# Patient Record
Sex: Male | Born: 1955 | Race: White | Hispanic: No | Smoking: Current some day smoker
Health system: Southern US, Community
[De-identification: ages and names within clinical notes are randomized; demographics above are authoritative.]

## PROBLEM LIST (undated history)

## (undated) DIAGNOSIS — Z87442 Personal history of urinary calculi: Secondary | ICD-10-CM

## (undated) DIAGNOSIS — I6529 Occlusion and stenosis of unspecified carotid artery: Secondary | ICD-10-CM

## (undated) DIAGNOSIS — E785 Hyperlipidemia, unspecified: Secondary | ICD-10-CM

## (undated) DIAGNOSIS — M79606 Pain in leg, unspecified: Secondary | ICD-10-CM

## (undated) DIAGNOSIS — I480 Paroxysmal atrial fibrillation: Secondary | ICD-10-CM

## (undated) DIAGNOSIS — I2510 Atherosclerotic heart disease of native coronary artery without angina pectoris: Secondary | ICD-10-CM

## (undated) DIAGNOSIS — I10 Essential (primary) hypertension: Secondary | ICD-10-CM

## (undated) DIAGNOSIS — G458 Other transient cerebral ischemic attacks and related syndromes: Secondary | ICD-10-CM

## (undated) HISTORY — PX: CYSTECTOMY: SUR359

## (undated) HISTORY — DX: Paroxysmal atrial fibrillation: I48.0

## (undated) HISTORY — DX: Essential (primary) hypertension: I10

## (undated) HISTORY — DX: Pain in leg, unspecified: M79.606

## (undated) HISTORY — DX: Occlusion and stenosis of unspecified carotid artery: I65.29

## (undated) HISTORY — PX: OTHER SURGICAL HISTORY: SHX169

## (undated) HISTORY — DX: Other transient cerebral ischemic attacks and related syndromes: G45.8

## (undated) HISTORY — PX: SHOULDER ARTHROSCOPY WITH OPEN ROTATOR CUFF REPAIR: SHX6092

## (undated) HISTORY — PX: REPAIR PATELLAR TENDON: SUR1199

---

## 1976-02-29 HISTORY — PX: JOINT REPLACEMENT: SHX530

## 2001-11-12 HISTORY — PX: COLONOSCOPY W/ POLYPECTOMY: SHX1380

## 2002-05-04 ENCOUNTER — Encounter: Payer: Self-pay | Admitting: Internal Medicine

## 2005-03-23 ENCOUNTER — Ambulatory Visit (HOSPITAL_COMMUNITY): Admission: RE | Admit: 2005-03-23 | Discharge: 2005-03-23 | Payer: Self-pay | Admitting: Orthopedic Surgery

## 2005-03-23 ENCOUNTER — Ambulatory Visit (HOSPITAL_BASED_OUTPATIENT_CLINIC_OR_DEPARTMENT_OTHER): Admission: RE | Admit: 2005-03-23 | Discharge: 2005-03-23 | Payer: Self-pay | Admitting: Orthopedic Surgery

## 2005-10-22 ENCOUNTER — Ambulatory Visit: Payer: Self-pay | Admitting: Internal Medicine

## 2005-11-20 ENCOUNTER — Ambulatory Visit: Payer: Self-pay | Admitting: Internal Medicine

## 2006-02-06 ENCOUNTER — Encounter: Payer: Self-pay | Admitting: Internal Medicine

## 2006-06-04 ENCOUNTER — Ambulatory Visit: Payer: Self-pay | Admitting: Internal Medicine

## 2006-06-06 ENCOUNTER — Ambulatory Visit: Payer: Self-pay | Admitting: Internal Medicine

## 2006-06-07 ENCOUNTER — Ambulatory Visit: Payer: Self-pay | Admitting: Cardiology

## 2006-11-22 ENCOUNTER — Ambulatory Visit: Payer: Self-pay | Admitting: Internal Medicine

## 2007-10-10 ENCOUNTER — Ambulatory Visit: Payer: Self-pay | Admitting: Gastroenterology

## 2007-10-13 HISTORY — PX: COLONOSCOPY: SHX174

## 2007-10-24 ENCOUNTER — Encounter: Payer: Self-pay | Admitting: Internal Medicine

## 2007-10-24 ENCOUNTER — Ambulatory Visit: Payer: Self-pay | Admitting: Gastroenterology

## 2007-11-04 DIAGNOSIS — Z9889 Other specified postprocedural states: Secondary | ICD-10-CM

## 2007-11-04 DIAGNOSIS — L708 Other acne: Secondary | ICD-10-CM

## 2007-12-25 ENCOUNTER — Telehealth (INDEPENDENT_AMBULATORY_CARE_PROVIDER_SITE_OTHER): Payer: Self-pay | Admitting: *Deleted

## 2008-03-15 ENCOUNTER — Ambulatory Visit: Payer: Self-pay | Admitting: Internal Medicine

## 2008-03-15 ENCOUNTER — Ambulatory Visit (HOSPITAL_COMMUNITY): Admission: RE | Admit: 2008-03-15 | Discharge: 2008-03-15 | Payer: Self-pay | Admitting: *Deleted

## 2008-03-15 DIAGNOSIS — I10 Essential (primary) hypertension: Secondary | ICD-10-CM

## 2008-03-15 DIAGNOSIS — F17200 Nicotine dependence, unspecified, uncomplicated: Secondary | ICD-10-CM | POA: Insufficient documentation

## 2008-03-15 DIAGNOSIS — E782 Mixed hyperlipidemia: Secondary | ICD-10-CM | POA: Insufficient documentation

## 2008-03-15 DIAGNOSIS — F528 Other sexual dysfunction not due to a substance or known physiological condition: Secondary | ICD-10-CM | POA: Insufficient documentation

## 2008-03-23 ENCOUNTER — Encounter: Payer: Self-pay | Admitting: Internal Medicine

## 2008-03-24 ENCOUNTER — Encounter: Admission: RE | Admit: 2008-03-24 | Discharge: 2008-03-24 | Payer: Self-pay | Admitting: Orthopedic Surgery

## 2008-03-24 ENCOUNTER — Encounter: Payer: Self-pay | Admitting: Internal Medicine

## 2008-04-01 ENCOUNTER — Ambulatory Visit: Payer: Self-pay | Admitting: Internal Medicine

## 2008-04-02 ENCOUNTER — Encounter (INDEPENDENT_AMBULATORY_CARE_PROVIDER_SITE_OTHER): Payer: Self-pay | Admitting: *Deleted

## 2008-04-08 ENCOUNTER — Ambulatory Visit: Payer: Self-pay | Admitting: Internal Medicine

## 2008-04-08 DIAGNOSIS — N281 Cyst of kidney, acquired: Secondary | ICD-10-CM | POA: Insufficient documentation

## 2008-05-20 ENCOUNTER — Ambulatory Visit: Payer: Self-pay | Admitting: Internal Medicine

## 2008-05-24 ENCOUNTER — Encounter: Payer: Self-pay | Admitting: Internal Medicine

## 2008-05-31 ENCOUNTER — Encounter (INDEPENDENT_AMBULATORY_CARE_PROVIDER_SITE_OTHER): Payer: Self-pay | Admitting: *Deleted

## 2008-06-03 ENCOUNTER — Ambulatory Visit: Payer: Self-pay | Admitting: Internal Medicine

## 2008-06-03 LAB — CONVERTED CEMR LAB: Cholesterol, target level: 200 mg/dL

## 2008-10-04 ENCOUNTER — Ambulatory Visit: Payer: Self-pay | Admitting: Internal Medicine

## 2008-10-04 DIAGNOSIS — R358 Other polyuria: Secondary | ICD-10-CM

## 2008-10-04 DIAGNOSIS — R21 Rash and other nonspecific skin eruption: Secondary | ICD-10-CM

## 2008-10-12 ENCOUNTER — Encounter (INDEPENDENT_AMBULATORY_CARE_PROVIDER_SITE_OTHER): Payer: Self-pay | Admitting: *Deleted

## 2008-10-12 LAB — CONVERTED CEMR LAB
BUN: 16 mg/dL (ref 6–23)
Direct LDL: 120.9 mg/dL
HDL: 42.6 mg/dL (ref >39.0)
Hgb A1c MFr Bld: 5.6 % (ref 4.6–6.0)
Total CHOL/HDL Ratio: 5.1
VLDL: 28 mg/dL (ref 0–40)

## 2009-07-18 IMAGING — CR DG CHEST 2V
2 series · 2 of 2 positions shown · non-contrast
Comparison: None

CLINICAL DATA: Tobacco abuse

CHEST - 2 VIEW

[view not recorded (1 of 2)]
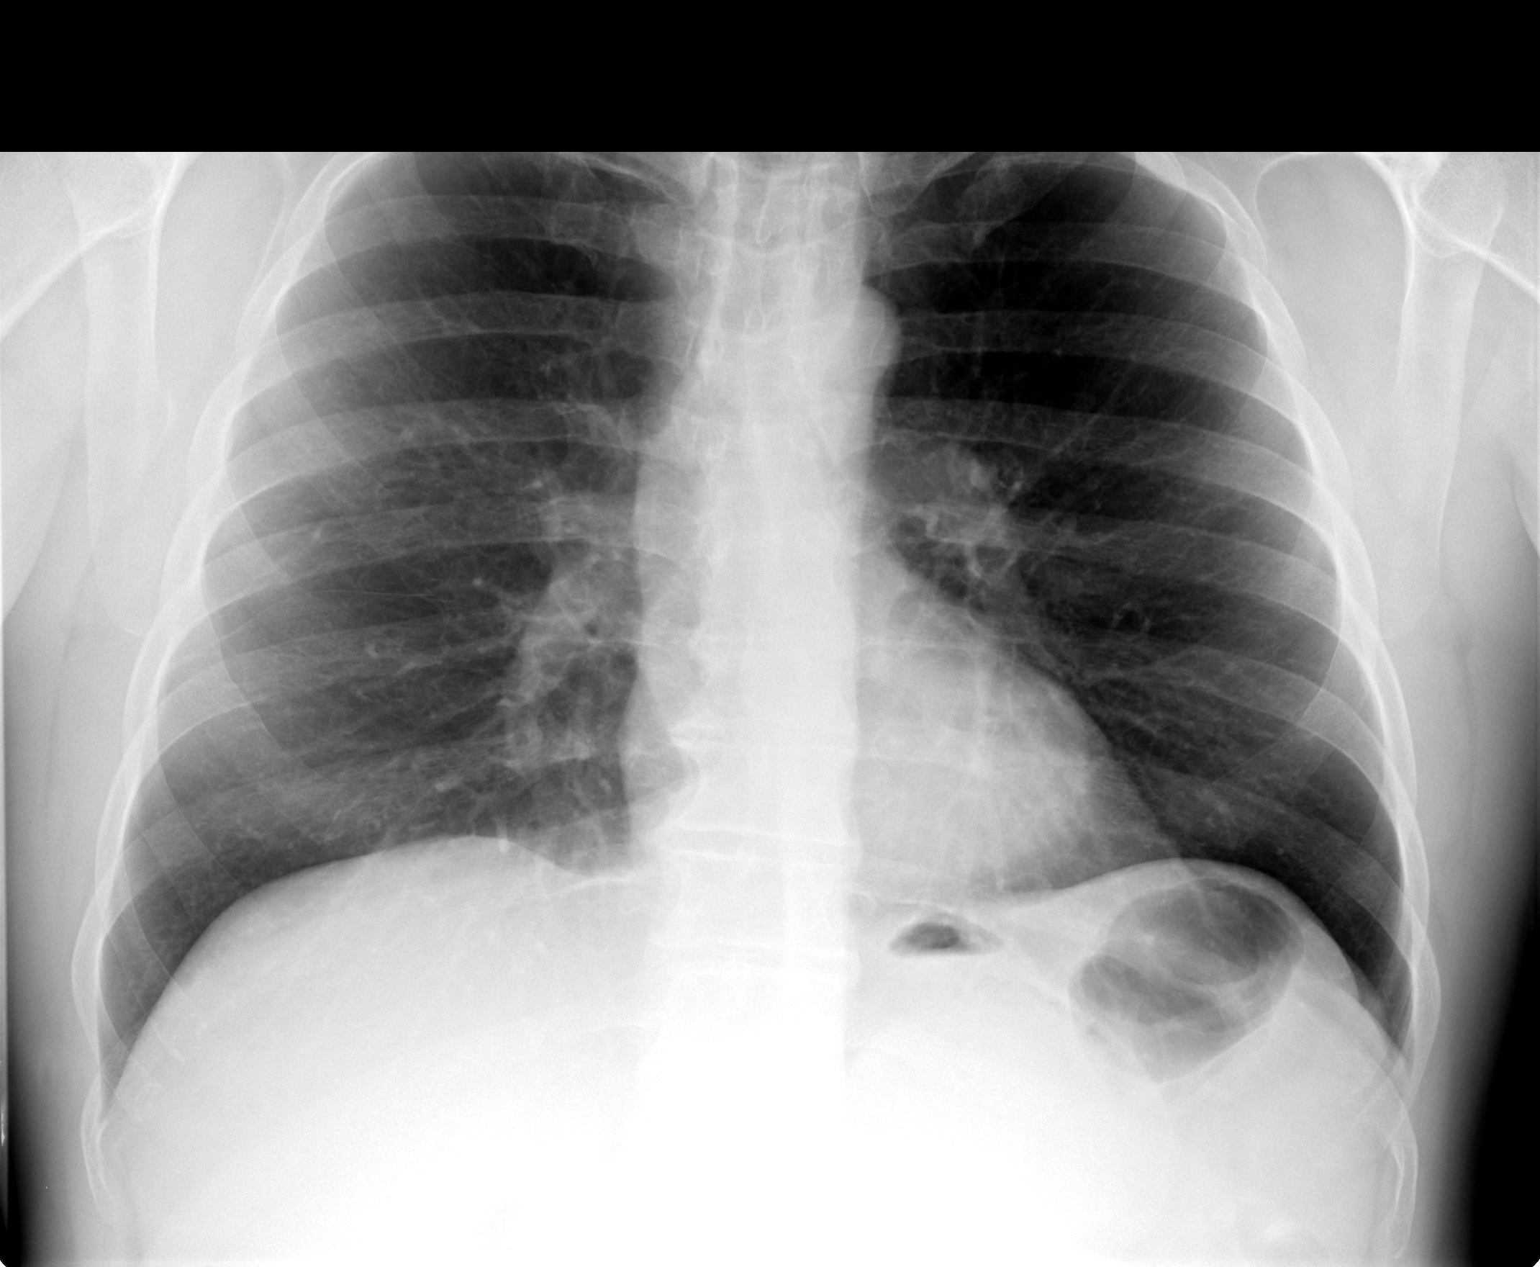

[view not recorded (2 of 2)]
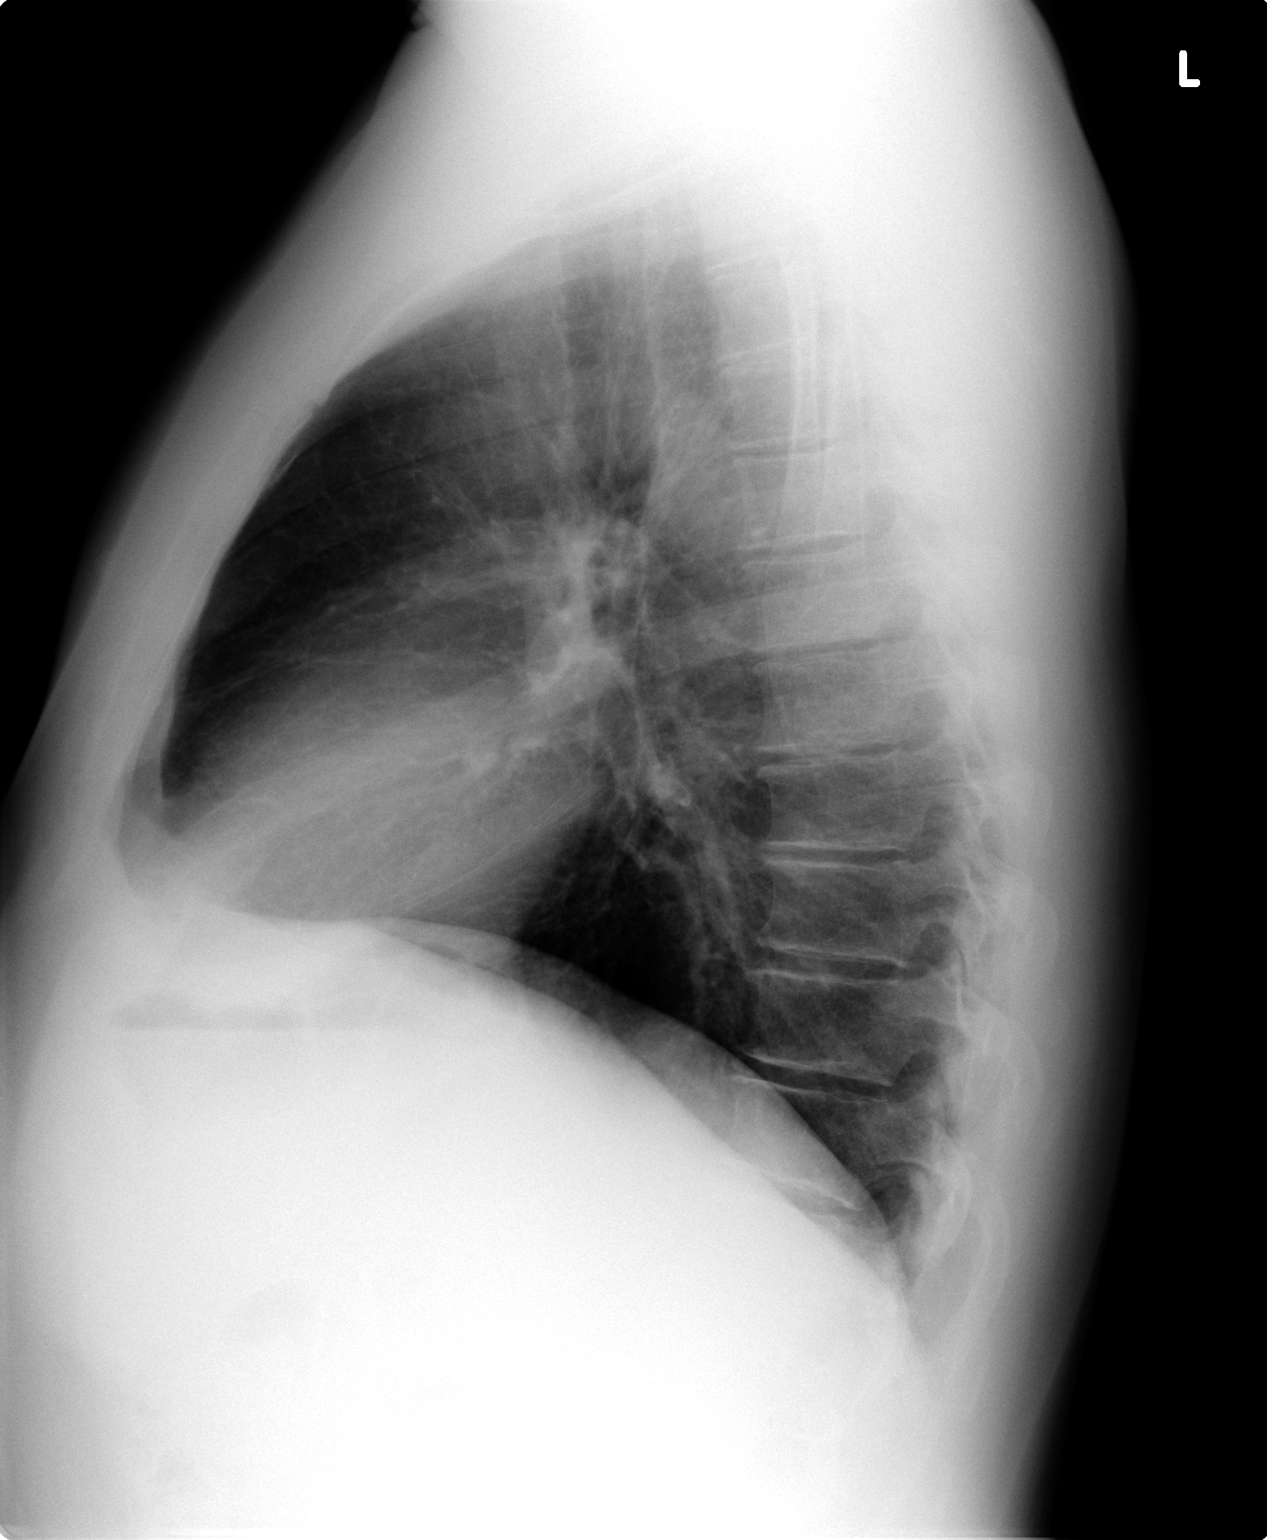

[2 of 2 positions shown; findings below may reference images not displayed]

FINDINGS: The heart size and mediastinal contours are within normal
limits.  Both lungs are clear.  The visualized skeletal structures
are unremarkable.
IMPRESSION: No active cardiopulmonary disease.

## 2010-07-12 ENCOUNTER — Observation Stay (HOSPITAL_COMMUNITY): Admission: EM | Admit: 2010-07-12 | Discharge: 2010-07-13 | Payer: Self-pay | Admitting: Emergency Medicine

## 2010-07-12 ENCOUNTER — Ambulatory Visit: Payer: Self-pay | Admitting: Cardiology

## 2010-07-13 ENCOUNTER — Ambulatory Visit: Payer: Self-pay | Admitting: Internal Medicine

## 2010-07-13 DIAGNOSIS — R002 Palpitations: Secondary | ICD-10-CM

## 2010-07-14 ENCOUNTER — Telehealth: Payer: Self-pay | Admitting: Internal Medicine

## 2010-07-21 ENCOUNTER — Telehealth: Payer: Self-pay | Admitting: Internal Medicine

## 2010-07-21 DIAGNOSIS — I4891 Unspecified atrial fibrillation: Secondary | ICD-10-CM | POA: Insufficient documentation

## 2010-08-07 ENCOUNTER — Telehealth (INDEPENDENT_AMBULATORY_CARE_PROVIDER_SITE_OTHER): Payer: Self-pay | Admitting: *Deleted

## 2010-08-16 ENCOUNTER — Ambulatory Visit: Payer: Self-pay | Admitting: Internal Medicine

## 2010-09-05 ENCOUNTER — Ambulatory Visit: Payer: Self-pay | Admitting: Internal Medicine

## 2010-09-05 ENCOUNTER — Telehealth (INDEPENDENT_AMBULATORY_CARE_PROVIDER_SITE_OTHER): Payer: Self-pay | Admitting: *Deleted

## 2010-09-26 ENCOUNTER — Telehealth: Payer: Self-pay | Admitting: Internal Medicine

## 2010-09-27 ENCOUNTER — Ambulatory Visit: Payer: Self-pay | Admitting: Internal Medicine

## 2010-10-27 ENCOUNTER — Telehealth: Payer: Self-pay | Admitting: Internal Medicine

## 2010-11-03 ENCOUNTER — Encounter: Payer: Self-pay | Admitting: Internal Medicine

## 2010-11-07 ENCOUNTER — Ambulatory Visit
Admission: RE | Admit: 2010-11-07 | Discharge: 2010-11-07 | Payer: Self-pay | Source: Home / Self Care | Attending: Internal Medicine | Admitting: Internal Medicine

## 2010-11-07 ENCOUNTER — Encounter: Payer: Self-pay | Admitting: Internal Medicine

## 2010-12-10 LAB — CONVERTED CEMR LAB
ALT: 20 units/L (ref 0–53)
AST: 27 units/L (ref 0–37)
BUN: 11 mg/dL (ref 6–23)
Basophils Absolute: 0 10*3/uL (ref 0.0–0.1)
Bilirubin, Direct: 0.1 mg/dL (ref 0.0–0.3)
Chloride: 107 meq/L (ref 96–112)
Eosinophils Relative: 2.1 % (ref 0.0–5.0)
GFR calc Af Amer: 115 mL/min
GFR calc non Af Amer: 95 mL/min
Glucose, Bld: 105 mg/dL — ABNORMAL HIGH (ref 70–99)
HCT: 42.8 % (ref 39.0–52.0)
Hemoglobin: 15.1 g/dL (ref 13.0–17.0)
Platelets: 281 10*3/uL (ref 150–400)
Potassium: 4.3 meq/L (ref 3.5–5.1)
RBC: 4.66 M/uL (ref 4.22–5.81)
Sodium: 142 meq/L (ref 135–145)
TSH: 1.48 microintl units/mL (ref 0.35–5.50)
Total Bilirubin: 0.8 mg/dL (ref 0.3–1.2)
Total Protein: 6.7 g/dL (ref 6.0–8.3)

## 2010-12-12 NOTE — Progress Notes (Signed)
Summary: question about appt   Phone Note Call from Patient Call back at Home Phone [phone]   Caller: Patient Summary of Call: Question about appt on 09/27/10 Initial call taken by: Judie Grieve,  September 26, 2010 1:34 PM  Follow-up for Phone Call        lmom for pt that his apt is tomorrow at 11:00  HE can call back if he needs to ask me further questions Dennis Bast, RN, BSN  September 26, 2010 2:51 PM

## 2010-12-12 NOTE — Progress Notes (Signed)
Summary: REFERRAL INQUIRY  Phone Note Call from Patient Call back at Home Phone [phone]   Caller: Patient Call For: Marga Melnick MD Reason for Call: Referral Summary of Call: PATIENT CALLED LEFT MESSAGE ON MY VM STATING HE WAS CALLING TO FIND OUT WHY HE HASN'T BEEN REFERRED TO A DR. Ladona Ridgel YET.  I HAVE REC'D NO REFERRALS ON THIS PT, BUT THERE IS A PHONE NOTE ABOUT A REFERRAL TO DR. Ladona Ridgel.  PLEASE ADVISE. Initial call taken by: Magdalen Spatz [facility],  July 21, 2010 2:01 PM  Follow-up for Phone Call        Appt Scheduled Follow-up by: Magdalen Spatz [facility],  August 04, 2010 10:31 AM  New Problems: PAROXYSMAL ATRIAL FIBRILLATION (ICD-427.31)   New Problems: PAROXYSMAL ATRIAL FIBRILLATION (ICD-427.31)

## 2010-12-12 NOTE — Progress Notes (Signed)
Summary: Earlier appt requested for sx   Phone Note Other Incoming   Reason for Call: Confirm/change Appt Summary of Call: This message is part of an e-mail received via our website from this patient's siste-in-law.:    "I am writing this correspondence on behalf of my brother-in-law Hollie Beach. He has an appointment with (Dr. Johney Frame) on October 5th, 2011. Nadine Counts is having recurrent fluttering of the heart approximately once a week with symptoms noted as dyspnea, profuse sweating, dizziness and chest pain. His resting heart rate during these periods is 127+ bpm. These episodes last for 20 to 30 mintues.   Emergent care has been sought for these episodes, with notation that he is to see a cardiologist ASAP."  She is concerned because of the increased frequence.  I want to bring this to your attention because of the symptoms and the clinical triage needed.  Any necessary correspondance should be with the patient - I did not verify HIPPA releases for the sister-in-law.  I will discuss the response that the patient's sister-in-law says that she got from our schedulers that she did not feel was adequate.     Initial call taken by: Fabio Neighbors, Site Manager     Appended Document: Earlier appt requested for sx pt called I offered an earlier time on Mon  He will keep his Jhonnie Garner afternoon appoinment

## 2010-12-12 NOTE — Assessment & Plan Note (Signed)
Summary: nep/afib/palps/jml  Medications Added ASPIRIN 81 MG TBEC (ASPIRIN) Take one tablet by mouth daily AMOXICILLIN 500 MG CAPS (AMOXICILLIN) once daily        Visit Type:  Initial Consult Primary Provider:  Marga Melnick MD   History of Present Illness: Mr [name] is a pleasant 55 yo WM with a h/o recurrent tachypalpitations who presents today for EP consultation.  He reports initially developing symptoms of palpitations 2003 for which he was hospitalized in [place].  He was felt to have an arrhythmia for which he was placed on Toprol XL.  He reports poor tolerance to toprol due to brief short term memory loss and difficulty with concentration.  He did well without recurrent symptoms until mid August when he developed recurrent palpitations.  He describes abrupt onset of tachypalpitations with associated diaphoresis and dizziness.  He reports associated chest tightness.  Episodes last 5-60 minutes in duration.  He is unaware of triggers or precipitants.  He recently presented to [facility] 8/31 and had returned to sinus rhythm upon arrival.  He feels that he has had 5 episodes in the last month.  Current Medications (verified): 1)  Viagra 100 Mg  Tabs (Sildenafil Citrate) .[name].. 1 By Mouth As Directed As Needed 2)  Aspirin 81 Mg Tbec (Aspirin) .... Take One Tablet By Mouth Daily 3)  Amoxicillin 500 Mg Caps (Amoxicillin) .... Once Daily  Allergies: 1)  ! * Tetanus  Past History:  Past Medical History: FBS 102 in 2006 Hypertension Chest pain 2003, neg evaluation Palpitations  Past Surgical History: Reviewed history from 03/15/2008 and no changes required. CYST REMOVED FROM BACK; R patellar surgery Colon polypectomy 2003; neg colonoscopy  12/ 2008 Rotator cuff repair Epidural steroids to back X 3  Family History: MOTHER-LIVING FATHER-LIVING 2 BROTHERS-LIVING 2 SISTERS-LIVING MI-PGF, FATHER HODGKINS DISEASE-BROTHER Brother recently had a CVA at age  72  Social History: Lives in [place] with wife.  Works in Airline pilot for [employer].  Tob-1 PPD.  ETOH- none.  denies drugs  Review of Systems       All systems are reviewed and negative except as listed in the HPI.   Vital Signs:  Patient profile:   55 year old male Height:      68.25 inches Weight:      177 pounds BMI:     26.81 Pulse rate:   81 / minute BP sitting:   126 / 82  (left arm)  Vitals Entered By: Laurance Flatten CMA (August 16, 2010 2:44 PM)  Physical Exam  General:  Well developed, well nourished, in no acute distress. Head:  normocephalic and atraumatic Eyes:  PERRLA/EOM intact; conjunctiva and lids normal. Mouth:  Teeth, gums and palate normal. Oral mucosa normal. Neck:  Neck supple, no JVD. No masses, thyromegaly or abnormal cervical nodes. Lungs:  Clear bilaterally to auscultation and percussion. Heart:  Non-displaced PMI, chest non-tender; regular rate and rhythm, S1, S2 without murmurs, rubs or gallops. Carotid upstroke normal, no bruit. Normal abdominal aortic size, no bruits. Femorals normal pulses, no bruits. Pedals normal pulses. No edema, no varicosities. Abdomen:  Bowel sounds positive; abdomen soft and non-tender without masses, organomegaly, or hernias noted. No hepatosplenomegaly. Msk:  Back normal, normal gait. Muscle strength and tone normal. Pulses:  pulses normal in all 4 extremities Extremities:  No clubbing or cyanosis. Neurologic:  Alert and oriented x 3. Skin:  Intact without lesions or rashes. Cervical Nodes:  no significant adenopathy Psych:  Normal affect.  EKG  Procedure date:  08/16/2010  Findings:      sinus rhythm 81 bpm, PR 158, QRS 80, Qtc 434, otherwise normal ekg  Stress Echocardiogram  Procedure date:  07/13/2010  Findings:       Left ventricular ejection fraction was normal   at rest and with stress. There was no echocardiographic evidence for  stress-induced ischemia.   Impression & Recommendations:  Problem  # 1:  PALPITATIONS (ICD-785.1) The patient presents today for further evaluation of palpitations.  His symptoms may possibly be due to atrial fibrillation, however, I do not see that this has been documented.  His recent stress echo was normal. At this time, I would recommend that we attempt to document his arrhythmia before initiating therapy. We will therefore place a 21 day lifewatch event monitor.  He will continue ASA. I will see him again in 6 weeks.  Problem # 2:  SMOKER (ICD-305.1) cessation advised  Problem # 3:     Other Orders: EKG w/ Interpretation (93000)  Patient Instructions: 1)  Your physician recommends that you schedule a follow-up appointment in: 6 weeks with Dr Johney Frame 2)  Your physician has recommended that you wear an event monitor.  Event monitors are medical devices that record the heart's electrical activity. Doctors most often use these monitors to diagnose arrhythmias. Arrhythmias are problems with the speed or rhythm of the heartbeat. The monitor is a small, portable device. You can wear one while you do your normal daily activities. This is usually used to diagnose what is causing palpitations/syncope (passing out).

## 2010-12-12 NOTE — Letter (Signed)
Summary: [facility] Discharge Summary  [facility] Discharge Summary   Imported By: Roderic Ovens 08/25/2010 15:08:25  _____________________________________________________________________  External Attachment:    Type:   Image     Comment:   External Document

## 2010-12-12 NOTE — Progress Notes (Signed)
Summary: event monitor  Phone Note Outgoing Call Call back at [facility] Phone [phone]   Call placed by: Stanton Kidney, EMT-P,  September 05, 2010 3:29 PM Action Taken: Phone Call Completed Summary of Call: Event monitor to be mailed to pt, enrolled 09/05/10. Stanton Kidney, EMT-P  September 05, 2010 3:30 PM

## 2010-12-12 NOTE — Progress Notes (Signed)
Summary: FYI fyi palpitation again  Phone Note Call from Patient Call back at Home Phone [phone]   Caller: Patient Summary of Call: patient was seen [id number] hosp discharge - he is at work - wanted to let dr hopper know he had palpitation / flutter  & it lasted about an hour Initial call taken by: Okey Regal [place],  July 14, 2010 2:32 PM  Follow-up for Phone Call        PT STATES THAT HE WAS TOLD TO REPORT ANY MORE EPISODE TO DR HOPPER PT DENIES BEING SYMPTOMATIC NOW..............Marland KitchenFelecia Deloach CMA  July 14, 2010 2:46 PM   Additional Follow-up for Phone Call Additional follow up Details #1::        I'll refer to Dr Ladona Ridgel Additional Follow-up by: Laren Boom, MD, [facility],  July 20, 2010 10:26 PM

## 2010-12-12 NOTE — Assessment & Plan Note (Signed)
Summary: NOTE FOR WORK//PH   Vital Signs:  Patient profile:   55 year old male Weight:      177 pounds BMI:     26.81 Temp:     98.7 degrees F oral Pulse rate:   72 / minute Resp:     16 per minute BP sitting:   124 / 70  (left arm) Cuff size:   large  Vitals Entered By: Shonna Chock CMA (July 13, 2010 4:42 PM) CC: Hospital follow-up-patient would like note to return to work, Palpitations   CC:  Hospital follow-up-patient would like note to return to work and Palpitations.  History of Present Illness: Palpitations      This is a 55 year old man who presented  with Palpitations acutely while @ work 11 am on 07/12/2010. He was taken to [facility] ER; cardiac evaluation was negative.  The patient reports dizziness, chest tightness, and shortness of breath which resolved by his arrival @ ER .He  denies presyncope, syncope, and throat tightness.  Associated symptoms include diaphoresis.  The patient denies the following symptoms: blurred vision, numbness, weakness, and nausea.  The palpitations are described as a sensation of the heart beating strongly and a butterfly in the chest.  The palpitations are sudden in onset.  The palpitations are ? precipated by  anxiety. Stress test today revealed transient LBBB; he had no chest pain but had calf pain. [name]  PMH of similar episode in 2002 in [place]  diagnosed as " freezing up of top chambers " based on  Telemetry . He also had negative stress test & wore a heart monitor for 4 days. Cardiologist Rxed Toprol ; it was D/Ced due to some mental confusion while on this.He would drive opast his parkway exit. His sister has same issue.He needs release for work. He is in  Retail banker ; he must take cars off lot. He is smoking 1ppd  & drinks 4 cups / day.  Allergies: 1)  ! * Tetanus  Review of Systems General:  Denies fatigue and sleep disorder. Eyes:  Denies double vision and vision loss-both eyes. GI:  Denies constipation and diarrhea. Derm:  Denies  changes in nail beds, dryness, and hair loss. Neuro:  Denies numbness and tingling. Psych:  Denies depression, easily angered, easily tearful, and irritability. Endo:  Denies cold intolerance and heat intolerance.  Physical Exam  General:  in no acute distress; alert,appropriate and cooperative throughout examination Eyes:  No corneal or conjunctival inflammation noted. No lid lag Neck:  No deformities, masses, or tenderness noted. Lungs:  Normal respiratory effort, chest expands symmetrically. Lungs are clear to auscultation, no crackles or wheezes. Heart:  normal rate, regular rhythm, no murmur, no gallop, no rub, and no JVD.   Neurologic:  alert & oriented X3 and DTRs symmetrical and normal.   Skin:  Intact without suspicious lesions or rashes Psych:  memory intact for recent and remote, normally interactive, good eye contact, and not anxious appearing.     Impression & Recommendations:  Problem # 1:  PALPITATIONS (ICD-785.1) ? PAF   Problem # 2:  SMOKER (ICD-305.1)  Risks discussed  Orders: Tobacco use cessation intermediate 3-10 minutes ([address])  Complete Medication List: 1)  Viagra 100 Mg Tabs (Sildenafil citrate) .[name].. 1 by mouth as directed as needed  Patient Instructions: 1)  Medically cleared to return to work; return to work note provided for 07/14/2010. Obtain the [facility] records.Schedule free T4, Free T3 & TSH (thyroid tests)  2)  Stop Smoking Tips: Choose a Quit date. Cut down before the Quit date. decide what you will do as a substitute when you feel the urge to smoke(gum,toothpick,exercise). 3)  Take an 81 mg coated  Aspirin every day.

## 2010-12-14 NOTE — Progress Notes (Signed)
Summary: need release to return to work   Phone Note Call from Patient Call back at [employer] [phone]   Caller: Patient Summary of Call: Pt calling regarding getting release to retur to work Initial call taken by: Judie Grieve,  October 27, 2010 10:26 AM  Follow-up for Phone Call        Pt. states had an episode of  palpitations at work which subsided in a minute or so. Pt's employer now would like to have an official written note stating  that he is able  to work. Follow-up by: Ollen Gross, RN, BSN,  October 27, 2010 12:04 PM  Additional Follow-up for Phone Call Additional follow up Details #1::        we will need to review his recently placed event monitor before making this decision Note done and out front Dennis Bast, RN, BSN  November 03, 2010 5:16 PM Additional Follow-up by: Hillis Range, MD,  November 01, 2010 10:45 PM

## 2010-12-14 NOTE — Assessment & Plan Note (Signed)
Summary: tachycardia/ walk in /jr  Nurse Visit   Vital Signs:  Patient profile:   56 year old male Pulse rate:   82 / minute Pulse rhythm:   regular Resp:     18 per minute BP supine:   130 / 80  (left arm)  Vitals Entered By: Layne Benton, RN, BSN (November 07, 2010 4:11 PM)   Impression & Recommendations:  Problem # 1:  785.1 Patient walked into clinic and reported that he had an episode of a fast heart rate at about 150 with a BP of 177/120 by wrist cuff at home on 11/03/2010 that lasted about an hour while watching TV. He states that the episode was associated with pain around the area of his left clavicle. He did not seek medical attention and the heart rate came back down to normal on its own.  Today he has no complaints. His BP is 130/80 with a heart rate of 82. EKG shows normal sinus rhythm. Advised him that if he has another episode that this needs to be documented right away,  therefore to preceed to the nearest ER or call 911. Patient verbalized understanding. Will discuss with Dr.Julee Stoll when he returns to the office and call patient back with potential plan. Patient has a long term ROV with Dr.Nellie Pester in March of 2012.  Layne Benton, RN, BSN  November 07, 2010 4:12 PM  Other Orders: EKG w/ Interpretation (93000)     Current Medications (verified): 1)  Viagra 100 Mg  Tabs (Sildenafil Citrate) .[name].. 1 By Mouth As Directed As Needed 2)  Aspirin 81 Mg Tbec (Aspirin) .... Take One Tablet By Mouth Daily 3)  Amoxicillin 500 Mg Caps (Amoxicillin) .... Once Daily  Allergies (verified): 1)  ! * Tetanus  Orders Added: 1)  EKG w/ Interpretation [93000]  Appended Document: tachycardia/ walk in /jr Agree with above. Pt to follow-up as scheduled.

## 2010-12-14 NOTE — Assessment & Plan Note (Signed)
Summary: 6wk f/u sl   Primary Provider:  Marga Melnick MD  CC:  ROV.  History of Present Illness: [name] is a pleasant 55 yo WM with a h/o recurrent tachypalpitations who presents today for EP follow-up.   His tachypalpitations have improved.  He denies chest tightness, shortness of breath, orthopnea, PND, lower extremity edema, dizziness, presyncope, syncope, or neurologic sequela. The patient is tolerating medications without difficulties and is otherwise without complaint today.   Problems Prior to Update: 1)  Paroxysmal Atrial Fibrillation  (ICD-427.31) 2)  Smoker  (ICD-305.1) 3)  Palpitations  (ICD-785.1) 4)  Polyuria  (ICD-788.42) 5)  Rash-nonvesicular  (ICD-782.1) 6)  Renal Cyst  (ICD-593.2) 7)  Nondependent Tobacco Use Disorder  (ICD-305.1) 8)  Erectile Dysfunction  (ICD-302.72) 9)  Other and Unspecified Hyperlipidemia  (ICD-272.4) 10)  Hypertension  (ICD-401.9) 11)  Rotator Cuff Repair, Right, Hx of  (ICD-V45.89) 12)  Other Acne  (ICD-706.1)  Medications Prior to Update: 1)  Viagra 100 Mg  Tabs (Sildenafil Citrate) .[name].. 1 By Mouth As Directed As Needed 2)  Aspirin 81 Mg Tbec (Aspirin) .... Take One Tablet By Mouth Daily 3)  Amoxicillin 500 Mg Caps (Amoxicillin) .... Once Daily  Current Medications (verified): 1)  Viagra 100 Mg  Tabs (Sildenafil Citrate) .[name].. 1 By Mouth As Directed As Needed 2)  Aspirin 81 Mg Tbec (Aspirin) .... Take One Tablet By Mouth Daily 3)  Amoxicillin 500 Mg Caps (Amoxicillin) .... Once Daily  Allergies: 1)  ! * Tetanus  Past History:  Past Medical History: Reviewed history from 08/16/2010 and no changes required. FBS 102 in 2006 Hypertension Chest pain 2003, neg evaluation Palpitations  Past Surgical History: Reviewed history from 03/15/2008 and no changes required. CYST REMOVED FROM BACK; R patellar surgery Colon polypectomy 2003; neg colonoscopy  12/ 2008 Rotator cuff repair Epidural steroids to back X 3  Vital  Signs:  Patient profile:   55 year old male Height:      68 inches Weight:      178 pounds BMI:     27.16 Pulse rate:   80 / minute Pulse rhythm:   regular BP sitting:   112 / 76  (left arm) Cuff size:   regular  Vitals Entered By: Stanton Kidney, EMT-P (September 27, 2010 11:20 AM)  Physical Exam  General:  Well developed, well nourished, in no acute distress. Head:  normocephalic and atraumatic Eyes:  PERRLA/EOM intact; conjunctiva and lids normal. Mouth:  Teeth, gums and palate normal. Oral mucosa normal. Neck:  Neck supple, no JVD. No masses, thyromegaly or abnormal cervical nodes. Lungs:  Clear bilaterally to auscultation and percussion. Heart:  Non-displaced PMI, chest non-tender; regular rate and rhythm, S1, S2 without murmurs, rubs or gallops. Carotid upstroke normal, no bruit. Normal abdominal aortic size, no bruits. Femorals normal pulses, no bruits. Pedals normal pulses. No edema, no varicosities. Abdomen:  Bowel sounds positive; abdomen soft and non-tender without masses, organomegaly, or hernias noted. No hepatosplenomegaly. Msk:  Back normal, normal gait. Muscle strength and tone normal. Pulses:  pulses normal in all 4 extremities Extremities:  No clubbing or cyanosis. Neurologic:  Alert and oriented x 3.   Impression & Recommendations:  Problem # 1:  PALPITATIONS (ICD-785.1) He is wearing a lifewatch monitor.  So far, no arrhythmias have been detected. continue ASA  Problem # 2:  HYPERTENSION (ICD-401.9) stable  Problem # 3:  NONDEPENDENT TOBACCO USE DISORDER (ICD-305.1) smoking cessation advised  Patient Instructions: 1)  Your physician recommends that you  schedule a follow-up appointment in: 4 months with Dr Johney Frame

## 2010-12-14 NOTE — Letter (Signed)
Summary: Return To Work  [employer], Main Office  [address]   [place], [place] [address]   Phone: [phone]  Fax: [phone]    11/03/2010  TO: Leodis Sias IT MAY CONCERN   RE: [name] [address] [id number]   The above named individual is under my medical care and may return to work without restrictions.  If you have any further questions or need additional information, please call.     Sincerely,    Dr Hillis Range, MD Dennis Bast, RN, BSN

## 2011-01-24 ENCOUNTER — Encounter: Payer: Self-pay | Admitting: Internal Medicine

## 2011-01-25 ENCOUNTER — Ambulatory Visit: Payer: Self-pay | Admitting: Internal Medicine

## 2011-01-26 LAB — POCT I-STAT, CHEM 8
Calcium, Ion: 1.23 mmol/L (ref 1.12–1.32)
Chloride: 108 mEq/L (ref 96–112)
Glucose, Bld: 86 mg/dL (ref 70–99)
HCT: 47 % (ref 39.0–52.0)
Hemoglobin: 16 g/dL (ref 13.0–17.0)
TCO2: 27 mmol/L (ref 0–100)

## 2011-01-26 LAB — DIFFERENTIAL
Basophils Absolute: 0 10*3/uL (ref 0.0–0.1)
Basophils Relative: 0 % (ref 0–1)
Eosinophils Absolute: 0.2 10*3/uL (ref 0.0–0.7)
Monocytes Absolute: 1 10*3/uL (ref 0.1–1.0)
Monocytes Relative: 8 % (ref 3–12)
Neutro Abs: 8.1 10*3/uL — ABNORMAL HIGH (ref 1.7–7.7)

## 2011-01-26 LAB — CBC
HCT: 44.6 % (ref 39.0–52.0)
MCH: 31.6 pg (ref 26.0–34.0)
MCHC: 35.9 g/dL (ref 30.0–36.0)
RDW: 13.1 % (ref 11.5–15.5)

## 2011-01-26 LAB — POCT CARDIAC MARKERS
Troponin i, poc: <0.05 ng/mL (ref 0.00–0.09)
Troponin i, poc: <0.05 ng/mL (ref 0.00–0.09)
Troponin i, poc: <0.05 ng/mL (ref 0.00–0.09)

## 2011-02-01 ENCOUNTER — Encounter (INDEPENDENT_AMBULATORY_CARE_PROVIDER_SITE_OTHER): Payer: Self-pay | Admitting: *Deleted

## 2011-02-08 NOTE — Letter (Signed)
Summary: Appointment - Missed  [facility], Main Office  [address]   [place], [place] [address]   Phone: [phone]  Fax: [phone]     February 01, 2011 MRN: [id number]   [name] [address] [place], [place]  [address]   Dear [name],  Our records indicate you missed your appointment on  01-25-11 with Dr.  Johney Frame .                                    It is very important that we reach you to reschedule this appointment. We look forward to participating in your health care needs. Please contact us at the number listed above at your earliest convenience to reschedule this appointment.     Sincerely,    Glass blower/designer

## 2011-03-30 NOTE — Op Note (Signed)
NAMEVYNCENT, OVERBY              ACCOUNT NO.:  [id number]   MEDICAL RECORD NO.:  [id number]          PATIENT TYPE:  AMB   LOCATION:  DSC                          FACILITY:  [facility]   PHYSICIAN:  Harvie Junior, M.D.   DATE OF BIRTH:  [birth year]   DATE OF PROCEDURE:  03/23/2005  DATE OF DISCHARGE:                                 OPERATIVE REPORT   PREOPERATIVE DIAGNOSIS:  Impingement with questionable rotator cuff tear.   POSTOPERATIVE DIAGNOSES:  1.  Anterior labral tear.  2.  Rotator cuff tear full thickness.   OPERATION PERFORMED:  1.  Miniopen rotator cuff repair of acutely torn rotator cuff.  2.  Arthroscopic anterolateral acromioplasty.  3.  Debridement of anterior labral tear.   SURGEON:  Harvie Junior, M.D.   ASSISTANT:  Marshia Ly, P.A.   ANESTHESIA:  General.   INDICATIONS FOR PROCEDURE:  He is a 55 year old male with a long history of  having had a significant shoulder pain.  He ultimately had an MRI which  showed that he had questionable rotator cuff pathology.  Injection therapy  helped.  Because of failure of conservative care, he was ultimately taken to  the operating room for operative arthroscopy.   DESCRIPTION OF PROCEDURE:  The patient was taken to the operating room.  After adequate anesthesia was obtained with general anesthetic, the patient  was placed on the operating table.  The right shoulder was prepped and  draped in the usual sterile fashion.  Following this, routine arthroscopic  examination of the shoulder revealed an obvious anterior labral tear which  was debrided.  The rotator cuff was obviously torn in the undersurface with  complete tear, a few remaining fibers but a fairly dramatic high grade  partial thickness tear.  At this point it was felt that a mini open rotator  cuff repair was going to be necessary.  At this point the cannula was  returned to the subacromial space.  The anterolateral spur was identified  and anterolateral  acromioplasty was performed from a lateral and posterior  compartment.  Attention was then turned towards the rotator cuff. There was  a thinned area but I was not completely sure exactly where this high grade  partial thickness area was.  I went back in the glenohumeral joint, marked  it with a suture and then went up top to define the exact location of the  partial thickness rotator cuff tear.  Once this was defined, a small  incision was made over the lateral aspect of the shoulder and the  subcutaneous tissue dissected down to the level of the bursa which was  debrided and then the area of the suture was identified.  A small opening  was made and then the full thickness area of the rotator cuff tear could be  clearly identified and extended anteriorly and posteriorly to the extent  that we could see on the undersurface.  A single suture anchor was used to  anchor this area of the rotator cuff down after a bur and a trough was made.  Once the trough was made,  it was felt that the single suture anchor would be  reasonable and this was placed and two sutures were tied.  The ends of the  suture were then used to anchor to the remaining rotator cuff.  Excellent  repair was achieved and the arm was put through a range of motion.  Excellent range of motion was achieved.  At this point the wound was  copiously irrigated and suctioned dry.  The arthroscopic portals were closed  with a bandage.  The deep deltoid was closed with a 0 Vicryl running suture,  skin with 0 and 2-0 Vicryl, skin with 3-0 Maxon pull out sutures.  Benzoin  and Steri-Strips were applied.  The patient was then transferred to the  recovery room where he was noted to be in satisfactory condition.  The  estimated blood loss for this procedure was none.       JLG/MEDQ  D:  03/23/2005  T:  03/24/2005  Job:  [id number]

## 2011-03-30 NOTE — Assessment & Plan Note (Signed)
[facility]                        [facility] [facility] OFFICE NOTE   NAME:[name]                       MRN:          [id number]  DATE:11/22/2006                            DOB:          1956-06-20    [name] was seen November 22, 2006 for a cyst in the groin area  which had been present for 2 days. He has seen a dermatologist and had  been on tetracycline from September 17, 2006 for scalp lesions which were  50% better.  A culture had revealed no methicillin-resistant  Staphylococcus aureus by his report.   He was describing nasal congestion as well with slight dental pain.  He  also had fever, chills and night sweats.  He has nasal obstruction at  night but denied facial pain, anosmia, or nasal purulence.     His past history includes right patellar repair in 1977. He has had a  cyst removed from his back which probably an epidermoid inclusion cyst.  He has had rotator cuff repair on the right in 1977. Colonoscopy has  revealed polyps.  He has had disc herniation at L5-S1.  He does have a  history of adult onset acne that has been followed by Dr. Elmon Else.   FAMILY HISTORY:  Is positive for myocardial infarction, Hodgkin's  disease.   He continues to smoke 5 cigarettes a day; does not drink.   He is allergic to TETANUS.   MEDICATIONS:  1. He is on a topical lotion (Glisson).  2. Saw palmetto extract.  3. Benazepril 40 mg 1/2 daily.  4. Tetracycline 750 mg twice a day.   His weight was up approximately 8 pounds, 186.  Temperature was 99.6.  Pulse 52.  Respiratory rate 17.  Blood pressure was 160/60.  Increased cerumen was noted in the canals.  There was erythema of the  oropharynx and nasal edema.  Extraocular motion was intact.  Pupils were  equal and reactive to light.  He had no lymphadenopathy.  There was a small papule over the chin.  A  2.5 cm subcutaneous lesion was noted in the left lingual which was  tender to  palpation.   He was prescribed Augmentin 875 mg every 12 hours with food.  Hibiclens  cleansing was recommended along with warm compresses.   I explicitly described the warning sign which would include pain, fever  or enlargement of the subcutaneous nodule.  I reinforced that he should  return in 5 days if it would fail to resolve or if the lesion worsened.  I was concerned that incision and drainage might be necessary by a  surgeon.   He called on December 06, 2006 requesting a refill on amoxicillin; I  believe that he meant Augmentin.  He refused a same day office visit.  According to our triage nurse and the office manager, he was very upset  and stated that he was firing Dr Alwyn Ren.  He has requested release of  medical records.   He was informed of my concern about the risk of unresolved infection and  possible complications such as sepsis or osteomyelitis.  As of the date of this dictation, he has not faxed [place] a release of  medical records.     Titus Dubin. Alwyn Ren, MD,FACP,FCCP  Electronically Signed    [facility]/MedQ  DD: 12/11/2006  DT: 12/11/2006  Job #: [id number]

## 2011-09-05 ENCOUNTER — Encounter (HOSPITAL_BASED_OUTPATIENT_CLINIC_OR_DEPARTMENT_OTHER): Payer: Self-pay | Admitting: Emergency Medicine

## 2011-09-05 ENCOUNTER — Emergency Department (HOSPITAL_BASED_OUTPATIENT_CLINIC_OR_DEPARTMENT_OTHER)
Admission: EM | Admit: 2011-09-05 | Discharge: 2011-09-06 | Disposition: A | Discharge status: Other Acute Inpatient Hospital | Payer: 59 | Source: Home / Self Care | Attending: Emergency Medicine | Admitting: Emergency Medicine

## 2011-09-05 ENCOUNTER — Other Ambulatory Visit: Payer: Self-pay

## 2011-09-05 DIAGNOSIS — I4891 Unspecified atrial fibrillation: Secondary | ICD-10-CM

## 2011-09-05 HISTORY — DX: Hyperlipidemia, unspecified: E78.5

## 2011-09-05 MED ORDER — SODIUM CHLORIDE 0.9 % IV SOLN
INTRAVENOUS | Status: DC
  Administered 2011-09-05: via INTRAVENOUS

## 2011-09-05 NOTE — ED Notes (Signed)
Pt c/o left sided chest pain radiating to left shoulder with shob. Denies n/v and diaphoresis. Pt reports pain was initially sharp in nature but is now dull. Pt took asa  81 mg x 5 tab PTA

## 2011-09-06 ENCOUNTER — Inpatient Hospital Stay (HOSPITAL_COMMUNITY)
Admission: EM | Admit: 2011-09-06 | Discharge: 2011-09-06 | DRG: 310 | Disposition: A | Discharge status: Home / Self Care | Payer: 59 | Source: Other Acute Inpatient Hospital | Attending: Internal Medicine | Admitting: Internal Medicine

## 2011-09-06 ENCOUNTER — Emergency Department (INDEPENDENT_AMBULATORY_CARE_PROVIDER_SITE_OTHER): Payer: 59

## 2011-09-06 DIAGNOSIS — I4891 Unspecified atrial fibrillation: Principal | ICD-10-CM | POA: Diagnosis present

## 2011-09-06 DIAGNOSIS — R599 Enlarged lymph nodes, unspecified: Secondary | ICD-10-CM | POA: Diagnosis present

## 2011-09-06 DIAGNOSIS — D72829 Elevated white blood cell count, unspecified: Secondary | ICD-10-CM | POA: Diagnosis present

## 2011-09-06 DIAGNOSIS — Z7982 Long term (current) use of aspirin: Secondary | ICD-10-CM

## 2011-09-06 DIAGNOSIS — R079 Chest pain, unspecified: Secondary | ICD-10-CM | POA: Diagnosis present

## 2011-09-06 DIAGNOSIS — F17200 Nicotine dependence, unspecified, uncomplicated: Secondary | ICD-10-CM | POA: Diagnosis present

## 2011-09-06 LAB — TROPONIN I: Troponin I: <0.3 ng/mL (ref <0.30)

## 2011-09-06 LAB — COMPREHENSIVE METABOLIC PANEL
BUN: 14 mg/dL (ref 6–23)
Calcium: 10 mg/dL (ref 8.4–10.5)
Creatinine, Ser: 0.9 mg/dL (ref 0.50–1.35)
GFR calc Af Amer: >90 mL/min (ref >90)
Glucose, Bld: 124 mg/dL — ABNORMAL HIGH (ref 70–99)
Sodium: 142 mEq/L (ref 135–145)
Total Protein: 7.5 g/dL (ref 6.0–8.3)

## 2011-09-06 LAB — CBC
HCT: 46.5 % (ref 39.0–52.0)
MCHC: 35.9 g/dL (ref 30.0–36.0)
RDW: 13.3 % (ref 11.5–15.5)

## 2011-09-06 LAB — DIFFERENTIAL
Basophils Absolute: 0.2 10*3/uL — ABNORMAL HIGH (ref 0.0–0.1)
Eosinophils Absolute: 0.3 10*3/uL (ref 0.0–0.7)
Lymphocytes Relative: 37 % (ref 12–46)
Lymphs Abs: 6.1 10*3/uL — ABNORMAL HIGH (ref 0.7–4.0)
Neutro Abs: 8.3 10*3/uL — ABNORMAL HIGH (ref 1.7–7.7)

## 2011-09-06 LAB — TSH: TSH: 1.797 u[IU]/mL (ref 0.350–4.500)

## 2011-09-06 LAB — CK TOTAL AND CKMB (NOT AT ARMC)
CK, MB: 3.1 ng/mL (ref 0.3–4.0)
Relative Index: 2.6 — ABNORMAL HIGH (ref 0.0–2.5)

## 2011-09-06 LAB — CARDIAC PANEL(CRET KIN+CKTOT+MB+TROPI)
CK, MB: 2.8 ng/mL (ref 0.3–4.0)
CK, MB: 2.5 ng/mL (ref 0.3–4.0)
Relative Index: INVALID (ref 0.0–2.5)
Troponin I: <0.3 ng/mL (ref <0.30)

## 2011-09-06 LAB — HEPARIN LEVEL (UNFRACTIONATED): Heparin Unfractionated: 0.43 IU/mL (ref 0.30–0.70)

## 2011-09-06 MED ORDER — DILTIAZEM HCL 100 MG IV SOLR
INTRAVENOUS | Status: AC
  Filled 2011-09-06: qty 100

## 2011-09-06 MED ORDER — HEPARIN BOLUS VIA INFUSION
60.0000 [IU]/kg | Freq: Once | INTRAVENOUS | Status: AC
  Administered 2011-09-06: 4626 [IU] via INTRAVENOUS
  Filled 2011-09-06: qty 4700

## 2011-09-06 MED ORDER — DILTIAZEM HCL 25 MG/5ML IV SOLN
INTRAVENOUS | Status: AC
  Filled 2011-09-06: qty 5

## 2011-09-06 MED ORDER — HEPARIN (PORCINE) IN NACL 100-0.45 UNIT/ML-% IJ SOLN
16.0000 [IU]/kg/h | Freq: Once | INTRAMUSCULAR | Status: AC
  Administered 2011-09-06: 16 [IU]/kg/h via INTRAVENOUS
  Filled 2011-09-06: qty 250

## 2011-09-06 MED ORDER — DILTIAZEM HCL 50 MG/10ML IV SOLN
10.0000 mg | Freq: Once | INTRAVENOUS | Status: AC
  Administered 2011-09-06: 25 mg via INTRAVENOUS
  Filled 2011-09-06: qty 2

## 2011-09-06 MED ORDER — DILTIAZEM HCL 100 MG IV SOLR
5.0000 mg/h | INTRAVENOUS | Status: DC
  Administered 2011-09-06: 5 mg/h via INTRAVENOUS

## 2011-09-06 NOTE — ED Provider Notes (Signed)
History     CSN: [id number] Arrival date & time: 09/05/2011 11:45 PM   First MD Initiated Contact with Patient 09/05/11 2358      Chief Complaint  Patient presents with  . Chest Pain    (Consider location/radiation/quality/duration/timing/severity/associated sxs/prior treatment) Patient is a 55 y.o. male presenting with chest pain. The history is provided by the patient.  Chest Pain Episode onset: This evening. Chest pain occurs constantly. The chest pain is unchanged. The severity of the pain is moderate. The pain radiates to the left shoulder. Primary symptoms include shortness of breath and palpitations. Pertinent negatives for primary symptoms include no fever, no fatigue, no cough and no wheezing.  The palpitations also occurred with shortness of breath.   Pertinent negatives for past medical history include no CAD, no MI and no PE.    Patient states this evening he started having some chest discomfort. Associated with this he felt palpitations in his heart. Patient also had some shortness of breath with it. Patient states initially the pain was sharp and notes a dull discomfort. He took some aspirin at home prior to arrival. Past Medical History  Diagnosis Date  . HTN (hypertension)   . Chest pain 2003    neg evaluation  . Palpitations   . Atrial fibrillation   . Hyperlipemia     Past Surgical History  Procedure Date  . Cystectomy     removed from back  . Repair patellar tendon     right  . Colonoscopy w/ polypectomy 2003  . Colonoscopy 10/2007    negative results  . Rotator cuff repair   . Epidural steroids     in the back x3    No family history on file.  History  Substance Use Topics  . Smoking status: Current Everyday Smoker -- 1.0 packs/day  . Smokeless tobacco: Not on file  . Alcohol Use: Yes      Review of Systems  Constitutional: Negative for fever and fatigue.  Respiratory: Positive for shortness of breath. Negative for cough and wheezing.     Cardiovascular: Positive for chest pain and palpitations.  All other systems reviewed and are negative.    Allergies  Tetanus toxoid  Home Medications   Current Outpatient Rx  Name Route Sig Dispense Refill  . AMOXICILLIN 500 MG PO TABS Oral Take 500 mg by mouth 2 (two) times daily.      . ASPIRIN 81 MG PO TABS Oral Take 81 mg by mouth daily.      [name] SILDENAFIL CITRATE 100 MG PO TABS Oral Take 100 mg by mouth as needed.        BP 171/92  Pulse 139  Temp(Src) 98.2 F (36.8 C) (Oral)  Resp 18  Wt 170 lb (77.111 kg)  SpO2 97%  Physical Exam  Nursing note and vitals reviewed. Constitutional: He appears well-developed and well-nourished. No distress.  HENT:  Head: Normocephalic and atraumatic.  Right Ear: External ear normal.  Left Ear: External ear normal.  Eyes: Conjunctivae are normal. Right eye exhibits no discharge. Left eye exhibits no discharge. No scleral icterus.  Neck: Neck supple. No tracheal deviation present.  Cardiovascular: Intact distal pulses and normal pulses.  An irregularly irregular rhythm present. Exam reveals no gallop and no distant heart sounds.   No murmur heard. Pulmonary/Chest: Effort normal and breath sounds normal. No stridor. No respiratory distress. He has no wheezes. He has no rales.  Abdominal: Soft. Bowel sounds are normal. He exhibits no distension. There  is no tenderness. There is no rebound and no guarding.  Musculoskeletal: He exhibits no edema and no tenderness.  Neurological: He is alert. He has normal strength. No sensory deficit. Cranial nerve deficit:  no gross defecits noted. He exhibits normal muscle tone. He displays no seizure activity. Coordination normal.  Skin: Skin is warm and dry. No rash noted.  Psychiatric: He has a normal mood and affect.    ED Course  Procedures (including critical care time)  Date: 09/06/2011  Rate: 150  Rhythm: atrial fibrillation  QRS Axis: normal  Intervals: a fib  ST/T Wave abnormalities:  normal  Conduction Disutrbances:none  Narrative Interpretation:  afib new since last ecg  Old EKG Reviewed: changes noted  1:16 AM case was discussed with Dr. Eldridge Dace.  The patient has been placed on a Cardizem drip. We'll continue to titrate the drip for rate control. IV heparin infusion has been ordered as well. Patient has improved with treatment although still somewhat tachycardic.   Labs Reviewed  CBC - Abnormal; Notable for the following:    WBC 16.4 (*)    All other components within normal limits  DIFFERENTIAL - Abnormal; Notable for the following:    Neutro Abs 8.3 (*)    Lymphs Abs 6.1 (*)    Monocytes Absolute 1.5 (*)    Basophils Absolute 0.2 (*)    All other components within normal limits  COMPREHENSIVE METABOLIC PANEL - Abnormal; Notable for the following:    Glucose, Bld 124 (*)    All other components within normal limits  CK TOTAL AND CKMB - Abnormal; Notable for the following:    Relative Index 2.6 (*)    All other components within normal limits  TROPONIN I  PATHOLOGIST SMEAR REVIEW   Dg Chest Portable 1 View  09/06/2011  *RADIOLOGY REPORT*  Clinical Data: Short of breath.  Atrial fibrillation.  Mid chest pain.  Hypertension.  Smoker.  PORTABLE CHEST - 1 VIEW  Comparison: 07/12/2010  Findings: Midline trachea.  Borderline cardiomegaly. Mediastinal contours otherwise within normal limits.  No pleural effusion or pneumothorax.  Minimal right apical pleural thickening. Mild volume loss/scarring at the left lung base.  IMPRESSION: Borderline cardiomegaly, without acute disease.  Original Report Authenticated By: Consuello Bossier, M.D.      CRITICAL CARE Performed by: Celene Kras   Total critical care time: 30  Critical care time was exclusive of separately billable procedures and treating other patients.  Critical care was necessary to treat or prevent imminent or life-threatening deterioration.  Critical care was time spent personally by me on the following  activities: development of treatment plan with patient and/or surrogate as well as nursing, discussions with consultants, evaluation of patient's response to treatment, examination of patient, obtaining history from patient or surrogate, ordering and performing treatments and interventions, ordering and review of laboratory studies, ordering and review of radiographic studies, pulse oximetry and re-evaluation of patient's condition.   MDM  Patient presents with atrial fibrillation with rapid ventricular rate. His heart rate improved into the low 100s with a Cardizem drip. At this time there does not appear to be any EKG changes to suggest acute cardiac ischemia. The patient apparently has history of this but is not currently on any medications for rate control or anticoagulation. He onset of the symptoms with this evening. I will consult with our cardiology for admission and further treatment.      Clinical impression: Atrial fibrillation with rapid ventricular response  Celene Kras, MD 09/06/11  0117 

## 2011-09-10 NOTE — H&P (Signed)
[name], [name]  MEDICAL RECORD NO.:  Shields  LOCATION:  2913                         FACILITY:  [facility]  PHYSICIAN:  Corky Crafts, MDDATE OF BIRTH:  1956-08-04  DATE OF ADMISSION:  09/06/2011 DATE OF DISCHARGE:                             HISTORY & PHYSICAL   PRIMARY CARE PHYSICIAN:  Titus Dubin. Alwyn Ren, MD, FACP, [facility]  PRIMARY CARDIOLOGIST:  Hillis Range, MD  REASON FOR ADMISSION:  Atrial fibrillation.  HISTORY OF PRESENT ILLNESS:  The patient is a 55 year old man who has known paroxysmal atrial fibrillation.  He was last evaluated by Dr. Johney Frame at the end of 2011.  Earlier today, he felt some discomfort in his upper chest which radiated to his left arm.  He also felt palpitations at the same time.  He went to the [facility]. He was resting there and was given IV diltiazem.  His chest discomfort eased off over several hours.  He was also given heparin.  He has not had any recent health problems or problems with any type of infections as far as he knows.  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  Currently, his chest pain is gone, his palpitations are better.  ALLERGIES:  TETANUS.  MEDICATIONS AT HOME:  Amoxicillin.  PAST MEDICAL HISTORY:  Atrial fibrillation, lymph node swelling.  SOCIAL HISTORY:  He does smoke a pack per day.  No alcohol.  He is married.  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  PAST SURGICAL HISTORY:  Knee surgery, shoulder surgery.  FAMILY HISTORY:  Father had an MI at age 50.  Sister has atrial fibrillation.  REVIEW OF SYSTEMS:  Significant for the chest discomfort and palpitations as described above.  He has also had a few headaches over the last few days as described above.  All other systems are negative.  PHYSICAL EXAMINATION:  VITAL SIGNS:  Blood pressure 128/51, heart rate ranging from  100-115. GENERAL:  He is awake and alert, in no apparent distress.  HEAD: Normocephalic, atraumatic. EYES:  Extraocular movements is intact. NECK:  No JVD. CARDIOVASCULAR:  Irregularly irregular rhythm.  Tachycardic.  S1, S2. LUNGS:  Clear to auscultation bilaterally. ABDOMEN:  Soft, nontender, nondistended. EXTREMITIES:  Showed no edema. NEURO:  No focal motor or sensory deficits. SKIN:  No rash.  LABORATORY DATA:  Shows elevated white count of 16.4, hemoglobin 16.7, creatinine 0.9, troponin less than 0.3, LFTs are normal.  In September 2011, he had a normal stress echo.  Chest x-ray today showed no acute disease.  ECG is pending.  ASSESSMENT:  A 55 year old with paroxysmal atrial fibrillation.  PLAN: 1. Continue IV diltiazem for rate control and heparin for stroke     prevention.  He will hopefully convert to normal sinus rhythm.  He     did mention that he had been on atenolol in the past and did not     tolerate this medicine.  He was on a different medicine which he     cannot remember the name, but did not tolerate this as well.  Upon  further history, he says that in both August and September he had     shorter lived episodes of irregular heartbeat.  They subsided on     their own, so he did not seek medical attention.  However, this     episode was more severe and he had to come in to the hospital.     Consider starting some type of antiarrhythmic to maintain normal     sinus rhythm. 2. Consider daily aspirin as an outpatient given that he has a low     CHADS score. 3. Smoking cessation.  He needs to stop smoking. 4. I do not think he needs another echo at this time since he had a     normal echo about a year ago.     Corky Crafts, MD     JSV/MEDQ  D:  09/06/2011  T:  09/06/2011  Job:  [id number]  Electronically Signed by Lance Muss MD on 09/10/2011 01:26:56 PM

## 2011-10-08 ENCOUNTER — Encounter: Payer: Self-pay | Admitting: Internal Medicine

## 2011-10-08 ENCOUNTER — Ambulatory Visit (INDEPENDENT_AMBULATORY_CARE_PROVIDER_SITE_OTHER): Payer: 59 | Admitting: Internal Medicine

## 2011-10-08 DIAGNOSIS — I4891 Unspecified atrial fibrillation: Secondary | ICD-10-CM

## 2011-10-08 DIAGNOSIS — I10 Essential (primary) hypertension: Secondary | ICD-10-CM

## 2011-10-08 DIAGNOSIS — F17200 Nicotine dependence, unspecified, uncomplicated: Secondary | ICD-10-CM

## 2011-10-08 MED ORDER — DILTIAZEM HCL ER COATED BEADS 180 MG PO CP24: 180.0000 mg | ORAL_CAPSULE | Freq: Every day | ORAL | Status: DC

## 2011-10-08 MED ORDER — BUPROPION HCL ER (SR) 150 MG PO TB12: 150.0000 mg | ORAL_TABLET | Freq: Two times a day (BID) | ORAL | Status: DC

## 2011-10-08 NOTE — Assessment & Plan Note (Signed)
He continues to smoke but would like to quit. We spent 10 minutes today discussing smoking cessation. He had good results with wellbutrin in the past. Start wellbutrin 150mg  daily x 3 days, then 150mg  BID thereafter

## 2011-10-08 NOTE — Patient Instructions (Signed)
Your physician recommends that you schedule a follow-up appointment in: 3 months with Dr Johney Frame  Your physician has requested that you have an echocardiogram. Echocardiography is a painless test that uses sound waves to create images of your heart. It provides your doctor with information about the size and shape of your heart and how well your heart's chambers and valves are working. This procedure takes approximately one hour. There are no restrictions for this procedure.   Your physician has recommended you make the following change in your medication:  1) Start Cardizem CD 180mg  daily 2) Start Wellbutrin 150mg  daily for 3 days then increase to twice daily

## 2011-10-08 NOTE — Assessment & Plan Note (Signed)
Stable No change required today  

## 2011-10-08 NOTE — Progress Notes (Signed)
Marga Melnick, MD, MD PCP  The patient presents today for routine electrophysiology followup.  Since his recent hospitalization for afib, the patient reports doing very well.  He has had three episodes of short afib since his discharge.  He has taken diltiazem as needed with termination of afib within several hours. Today, he denies symptoms of chest pain, shortness of breath, orthopnea, PND, lower extremity edema, dizziness, presyncope, syncope, or neurologic sequela.  The patient feels that he is tolerating medications without difficulties and is otherwise without complaint today.   Past Medical History  Diagnosis Date  . HTN (hypertension)   . Chest pain 2003    neg evaluation  . Paroxysmal atrial fibrillation   . Hyperlipemia    Past Surgical History  Procedure Date  . Cystectomy     removed from back  . Repair patellar tendon     right  . Colonoscopy w/ polypectomy 2003  . Colonoscopy 10/2007    negative results  . Rotator cuff repair   . Epidural steroids     in the back x3    Current Outpatient Prescriptions  Medication Sig Dispense Refill  . amoxicillin (AMOXIL) 500 MG tablet Take 500 mg by mouth 2 (two) times daily.        [name] aspirin 81 MG tablet Take 81 mg by mouth daily.        [name] diltiazem (CARDIZEM) 30 MG tablet 30 mg 4 (four) times daily. Take only when you have a episode.      [name] buPROPion (WELLBUTRIN SR) 150 MG 12 hr tablet Take 1 tablet (150 mg total) by mouth 2 (two) times daily.  60 tablet  3  . diltiazem (CARDIZEM CD) 180 MG 24 hr capsule Take 1 capsule (180 mg total) by mouth daily.  30 capsule  11  . sildenafil (VIAGRA) 100 MG tablet Take 100 mg by mouth as needed.          Allergies  Allergen Reactions  . Tetanus Toxoid     History   Social History  . Marital Status: Married    Spouse Name: N/A    Number of Children: N/A  . Years of Education: N/A   Occupational History  . Tour manager   Social History Main Topics  . Smoking status:  Current Everyday Smoker -- 1.0 packs/day  . Smokeless tobacco: Not on file  . Alcohol Use: Yes  . Drug Use: No  . Sexually Active: Not on file   Other Topics Concern  . Not on file   Social History Narrative  . No narrative on file    Physical Exam: Filed Vitals:   10/08/11 1153  BP: 139/86  Pulse: 92  Resp: 18  Height: 6\' 5"  (1.956 m)  Weight: 187 lb (84.823 kg)    GEN- The patient is well appearing, alert and oriented x 3 today.   Head- normocephalic, atraumatic Eyes-  Sclera clear, conjunctiva pink Ears- hearing intact Oropharynx- clear Neck- supple, no JVP Lymph- no cervical lymphadenopathy Lungs- Clear to ausculation bilaterally, normal work of breathing Heart- Regular rate and rhythm, no murmurs, rubs or gallops, PMI not laterally displaced GI- soft, NT, ND, + BS Extremities- no clubbing, cyanosis, or edema MS- no significant deformity or atrophy Skin- no rash or lesion Psych- euthymic mood, full affect Neuro- strength and sensation are intact  ekg today reveals sinus rhythm 86 bpm, PR 156, QRS 82, Qtc 418, poor R wave progression suggesting anterilateral infarction Stress  echo 9/11 reviewed today Assessment and Plan:

## 2011-10-08 NOTE — Assessment & Plan Note (Addendum)
He continues to have occasional afib Will start diltiazem CD 180mg  daily today CHADSVASC score is 1.  Continue ASA at this time May require an AAD if afib continues.  Echo to evaluate LA size and LV function EKG today reveals anterolateral infarction pattern, though stress echo from 9/11 was without evidence of this abnormality.  Consider further workup pending results of the echo.

## 2011-10-15 ENCOUNTER — Ambulatory Visit (HOSPITAL_COMMUNITY): Payer: 59 | Attending: Cardiology | Admitting: Radiology

## 2011-10-15 ENCOUNTER — Other Ambulatory Visit (HOSPITAL_COMMUNITY): Payer: 59 | Admitting: Radiology

## 2011-10-15 DIAGNOSIS — E785 Hyperlipidemia, unspecified: Secondary | ICD-10-CM | POA: Insufficient documentation

## 2011-10-15 DIAGNOSIS — Z8249 Family history of ischemic heart disease and other diseases of the circulatory system: Secondary | ICD-10-CM | POA: Insufficient documentation

## 2011-10-15 DIAGNOSIS — F17200 Nicotine dependence, unspecified, uncomplicated: Secondary | ICD-10-CM | POA: Insufficient documentation

## 2011-10-15 DIAGNOSIS — I4891 Unspecified atrial fibrillation: Secondary | ICD-10-CM | POA: Insufficient documentation

## 2011-10-15 DIAGNOSIS — I10 Essential (primary) hypertension: Secondary | ICD-10-CM

## 2011-11-03 ENCOUNTER — Ambulatory Visit (INDEPENDENT_AMBULATORY_CARE_PROVIDER_SITE_OTHER): Payer: 59

## 2011-11-03 DIAGNOSIS — R1084 Generalized abdominal pain: Secondary | ICD-10-CM

## 2011-12-11 ENCOUNTER — Ambulatory Visit (INDEPENDENT_AMBULATORY_CARE_PROVIDER_SITE_OTHER): Payer: 59 | Admitting: Physician Assistant

## 2011-12-11 VITALS — BP 120/80 | HR 76 | Temp 97.8°F | Resp 16 | Ht 68.5 in | Wt 179.0 lb

## 2011-12-11 DIAGNOSIS — L259 Unspecified contact dermatitis, unspecified cause: Secondary | ICD-10-CM

## 2011-12-11 DIAGNOSIS — N529 Male erectile dysfunction, unspecified: Secondary | ICD-10-CM

## 2011-12-11 DIAGNOSIS — R21 Rash and other nonspecific skin eruption: Secondary | ICD-10-CM

## 2011-12-11 DIAGNOSIS — L309 Dermatitis, unspecified: Secondary | ICD-10-CM

## 2011-12-11 MED ORDER — BETAMETHASONE DIPROPIONATE AUG 0.05 % EX LOTN: TOPICAL_LOTION | Freq: Two times a day (BID) | CUTANEOUS | Status: AC

## 2011-12-11 MED ORDER — SILDENAFIL CITRATE 100 MG PO TABS: 50.0000 mg | ORAL_TABLET | Freq: Every day | ORAL | Status: AC | PRN

## 2011-12-11 NOTE — Progress Notes (Signed)
  Subjective:    Patient ID: [name], male    DOB: 06-14-1956, 56 y.o.   MRN: [id number]  Rash This is a new problem. The current episode started 1 to 4 weeks ago. The problem has been gradually worsening since onset. The affected locations include the left lower leg and right lower leg. The rash is characterized by dryness, redness and scaling (does not itch). He was exposed to nothing. Pertinent negatives include no nail changes. Past treatments include moisturizer (coconut oil - no help). The treatment provided no relief.    Pt also would like a RF of his Viagra.  Takes 1/2 of a 100mg  prn.  Pt wants RF of daily amoxil for lymph nodes swelling irritation.  Review of Systems  Skin: Positive for rash. Negative for nail changes.       Objective:   Physical Exam  Constitutional: He appears well-developed and well-nourished.  HENT:  Head: Normocephalic and atraumatic.  Right Ear: External ear normal.  Left Ear: External ear normal.  Skin: Skin is warm and dry. Rash (Erythematous borders with central clearing) noted. Rash is macular. There is erythema.             Assessment & Plan:   1. Eczema    2. Rash, skin  POCT Skin KOH  3. Impotence     Diprolene for eczema - if getting worse may be a false Neg can call in Lotrisone.  Given Rx for viagra - f/u with his PCP for further RF.  Pt to F/u with PCP for Rx for Amoxil.

## 2011-12-11 NOTE — Patient Instructions (Addendum)
Rash, Generic °Many things can cause a rash. We are not certain what is causing the rash that you have. Some causes include infection, allergic reactions, medications, and chemicals. Sometimes something in your home that comes in contact with your skin may cause the rash. These include pets, new soaps, cosmetics, and foods. °HOME CARE INSTRUCTIONS  °· Avoid extreme heat or cold, unless otherwise instructed. This can make the itching worse.  °· A cool bath or shower or a cool washcloth can sometimes ease the itching.  °· Avoid scratching. This can cause infection.  °· Take those medications prescribed by your caregiver.  °SEEK IMMEDIATE MEDICAL CARE IF: °· You develop increasing pain, swelling, or redness.  °· You develop a fever.  °· You develop new or severe symptoms such as body aches and pains, diarrhea, vomiting.  °· Your rash is not better in 3 days.  °Document Released: 10/19/2002 Document Revised: 07/11/2011 Document Reviewed: 12/24/2008 °ExitCare® Patient Information ©2012 [employer]. °

## 2012-01-09 ENCOUNTER — Ambulatory Visit: Payer: 59 | Admitting: Internal Medicine

## 2012-01-11 ENCOUNTER — Encounter: Payer: Self-pay | Admitting: Internal Medicine

## 2014-06-12 DIAGNOSIS — G458 Other transient cerebral ischemic attacks and related syndromes: Secondary | ICD-10-CM

## 2014-06-12 HISTORY — DX: Other transient cerebral ischemic attacks and related syndromes: G45.8

## 2014-06-15 ENCOUNTER — Other Ambulatory Visit: Payer: Self-pay | Admitting: Internal Medicine

## 2014-06-15 DIAGNOSIS — R0989 Other specified symptoms and signs involving the circulatory and respiratory systems: Secondary | ICD-10-CM

## 2014-06-17 ENCOUNTER — Ambulatory Visit
Admission: RE | Admit: 2014-06-17 | Discharge: 2014-06-17 | Disposition: A | Discharge status: Home / Self Care | Payer: BC Managed Care – PPO | Source: Ambulatory Visit | Attending: Internal Medicine | Admitting: Internal Medicine

## 2014-06-17 DIAGNOSIS — R0989 Other specified symptoms and signs involving the circulatory and respiratory systems: Secondary | ICD-10-CM

## 2014-07-26 ENCOUNTER — Ambulatory Visit: Payer: BC Managed Care – PPO | Admitting: Internal Medicine

## 2014-07-27 ENCOUNTER — Encounter: Payer: Self-pay | Admitting: Surgery

## 2014-08-16 ENCOUNTER — Ambulatory Visit (INDEPENDENT_AMBULATORY_CARE_PROVIDER_SITE_OTHER): Payer: BC Managed Care – PPO | Admitting: Surgery

## 2014-08-16 ENCOUNTER — Encounter: Payer: Self-pay | Admitting: Surgery

## 2014-08-16 ENCOUNTER — Ambulatory Visit (HOSPITAL_COMMUNITY)
Admission: RE | Admit: 2014-08-16 | Discharge: 2014-08-16 | Disposition: A | Discharge status: Home / Self Care | Payer: BC Managed Care – PPO | Source: Ambulatory Visit | Attending: Surgery | Admitting: Surgery

## 2014-08-16 VITALS — BP 140/88 | HR 66 | Resp 16 | Ht 68.0 in | Wt 177.0 lb

## 2014-08-16 DIAGNOSIS — I739 Peripheral vascular disease, unspecified: Secondary | ICD-10-CM

## 2014-08-16 NOTE — Progress Notes (Signed)
Patient name: Martin BeachRobert T Shields MRN: [id number] DOB: 04/10/1956 Sex: male   Referred by: Dr. Lonzo CloudShamleffer  Reason for referral:  Chief Complaint  Patient presents with  . New Evaluation    Ref by Sr. Shamleffer  c/o  Right Steal Syndrome and Carotid Bruit  " Pt hears swishing sound  Left ear, 4-6 mo    HISTORY OF PRESENT ILLNESS: This is a pleasant 58 year old gentleman who is referred today for evaluation of a possible right subclavian steal syndrome.  The patient states that he underwent testing after complaining of hearing a bloodflow type is found in his left ear when he goes to sleep.  This began approximately 4-6 months ago.  He also complains of his left arm becoming very weak at times.  It bothers him so much that he cannot push the clutch in on his motorcycle.  He does not have strength issues on the right arm.  He does not have periods of time or his right arm gets out with activity.  He does not get dizzy with increasing activity in his upper extremities.  The patient has a history of atrial fibrillation.  He states that previously he was on medication but could not afford and he is not taking any medications for this now.  He is on a statin for hypercholesterolemia.  He does take an aspirin.  He is a current smoker, smoking one half pack per day  Past Medical History  Diagnosis Date  . HTN (hypertension)   . Chest pain 2003    neg evaluation  . Paroxysmal atrial fibrillation   . Hyperlipemia   . Carotid artery occlusion     Right Carotid Bruit  . Subclavian steal syndrome Aug. 2015    Occult Right    Past Surgical History  Procedure Laterality Date  . Cystectomy      removed from back  . Repair patellar tendon      right  . Colonoscopy w/ polypectomy  2003  . Colonoscopy  10/2007    negative results  . Rotator cuff repair    . Epidural steroids      in the back x3  . Joint replacement Right February 29, 1976    KNEE    History   Social History  . Marital  Status: Divorced    Spouse Name: N/A    Number of Children: N/A  . Years of Education: N/A   Occupational History  . 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 Smoking status: Current Every Day Smoker -- 0.50 packs/day for 20 years    Types: Cigarettes  . Smokeless tobacco: Never Used  . Alcohol Use: Yes  . Drug Use: No  . Sexual Activity: Not on file   Other Topics Concern  . Not on file   Social History Narrative  . No narrative on file    Family History  Problem Relation Age of Onset  . Heart disease Father     After age 57  . Heart attack Father   . Cancer Brother   . Stroke Brother     Allergies as of 08/16/2014 - Review Complete 08/16/2014  Allergen Reaction Noted  . Tetanus toxoid Other (See Comments)     Current Outpatient Prescriptions on File Prior to Visit  Medication Sig Dispense Refill  . amoxicillin (AMOXIL) 500 MG tablet Take 500 mg by mouth 2 (two) times daily.        .[name]  aspirin 325 MG tablet Take 325 mg by mouth daily.      [name] buPROPion (WELLBUTRIN SR) 150 MG 12 hr tablet Take 1 tablet (150 mg total) by mouth 2 (two) times daily.  60 tablet  3  . diltiazem (CARDIZEM CD) 180 MG 24 hr capsule Take 1 capsule (180 mg total) by mouth daily.  30 capsule  11  . diltiazem (CARDIZEM) 30 MG tablet 30 mg 4 (four) times daily. Take only when you have a episode.       No current facility-administered medications on file prior to visit.     REVIEW OF SYSTEMS: Cardiovascular: No chest pain, chest pressure, palpitations, orthopnea, or dyspnea on exertion. No claudication or rest pain,  No history of DVT or phlebitis. Pulmonary: No productive cough, asthma or wheezing. Neurologic: Right arm weakness Hematologic: No bleeding problems or clotting disorders. Musculoskeletal: No joint pain or joint swelling. Gastrointestinal: No blood in stool or hematemesis Genitourinary: No dysuria or hematuria. Psychiatric:: No history of major  depression. Integumentary: No rashes or ulcers. Constitutional: No fever or chills.  PHYSICAL EXAMINATION: General: The patient appears their stated age.  Vital signs are BP 140/88  Pulse 66  Resp 16  Ht 5\' 8"  (1.727 m)  Wt 177 lb (80.287 kg)  BMI 26.92 kg/m2  SpO2 98% HEENT:  No gross abnormalities Pulmonary: Respirations are non-labored Musculoskeletal: There are no major deformities.   Neurologic: No focal weakness or paresthesias are detected, Skin: There are no ulcer or rashes noted. Psychiatric: The patient has normal affect. Cardiovascular: There is a regular rate and rhythm without significant murmur appreciated.  Palpable radial pulse bilaterally  Diagnostic Studies: I have reviewed his outside ultrasound was shows less than 50% carotid stenosis bilaterally.  There was a abnormal right vertebral artery waveform but it was antegrade.  There was lost the elevation in the proximal right subclavian artery at 308 cm/s    Assessment:  Right arm steal syndrome Plan: The patient does have evidence of early steal syndrome on the right, however he is asymptomatic.  The patient's biggest complaint is that of hearing a swishing sound in his left ear when he lays down at night.  Carotid ultrasound showed the left carotid artery to be essentially normal.  I feel that he should have this further evaluated with a CT angiogram of the head and neck to exclude a dissection or aneurysm in the intracranial carotid artery.  In addition to a CT angiogram of the head and neck, he also needs to be evaluated for having potentially had a stroke in his right drain which has caused left hand symptoms and weakness.  He is at risk for stroke given his atrial fibrillation and lack of anticoagulation.  He is scheduled to see Dr. Johney Frame in the immediate future to evaluate his atrial fibrillation.  I do not think the patient has any acute vascular surgery issues.  I discussed the symptoms of progressive right  subclavian steal syndrome.  If any of these develop he will contact me.     Jorge Ny, M.D. [facility] Office: [phone] Pager:  [phone]

## 2014-09-10 ENCOUNTER — Ambulatory Visit (INDEPENDENT_AMBULATORY_CARE_PROVIDER_SITE_OTHER): Payer: BC Managed Care – PPO | Admitting: Internal Medicine

## 2014-09-10 ENCOUNTER — Telehealth: Payer: Self-pay | Admitting: Internal Medicine

## 2014-09-10 ENCOUNTER — Other Ambulatory Visit: Payer: Self-pay | Admitting: *Deleted

## 2014-09-10 ENCOUNTER — Encounter: Payer: Self-pay | Admitting: Internal Medicine

## 2014-09-10 VITALS — BP 137/90 | HR 69 | Ht 68.0 in | Wt 178.0 lb

## 2014-09-10 DIAGNOSIS — Z01818 Encounter for other preprocedural examination: Secondary | ICD-10-CM

## 2014-09-10 DIAGNOSIS — I10 Essential (primary) hypertension: Secondary | ICD-10-CM

## 2014-09-10 DIAGNOSIS — Z720 Tobacco use: Secondary | ICD-10-CM

## 2014-09-10 DIAGNOSIS — F17200 Nicotine dependence, unspecified, uncomplicated: Secondary | ICD-10-CM

## 2014-09-10 DIAGNOSIS — I6523 Occlusion and stenosis of bilateral carotid arteries: Secondary | ICD-10-CM

## 2014-09-10 DIAGNOSIS — Z136 Encounter for screening for cardiovascular disorders: Secondary | ICD-10-CM

## 2014-09-10 DIAGNOSIS — H9312 Tinnitus, left ear: Secondary | ICD-10-CM

## 2014-09-10 DIAGNOSIS — I480 Paroxysmal atrial fibrillation: Secondary | ICD-10-CM

## 2014-09-10 MED ORDER — BUPROPION HCL ER (SR) 150 MG PO TB12: ORAL_TABLET | ORAL | Status: DC

## 2014-09-10 NOTE — Telephone Encounter (Signed)
New problem   FYI: CT Angio Head/Neck has been sched for 09/17/14 @ [facility] 8am.

## 2014-09-10 NOTE — Patient Instructions (Addendum)
Your physician wants you to follow-up in: 6 months with Norma FredricksonLori Gerhardt, NP You will receive a reminder letter in the mail two months in advance. If you don't receive a letter, please call our office to schedule the follow-up appointment.  Your physician has requested that you have an exercise tolerance test. For further information please visit [email or web address]. Please also follow instruction sheet, as given.  Your physician has recommended you make the following change in your medication:  1) Start Wellbutrin--take one three times a day for 3 days then decrease to twice daily

## 2014-09-10 NOTE — Progress Notes (Signed)
Primary Care Physician: Raelyn MoraJARALLA SHAMLEFFER, Brent BullaIBTEHAL, MD   Hollie BeachRobert T Reifsteck is a 58 y.o. male with a h/o atrial  fibrillation in 2012, not seen in this office since that time. Pt is here for f/u due to recent complaints of abnormal sensation of blood swishing in his left ear as well as intermittent left arm weakness.  He describes this as a high pitched noise. He had a carotid u/s recently with f/u with Dr. Myra GianottiBrabham which showed early steal syndrome on the right, however asymptomatic. A CT of the head and neck was scheduled. He describes the weakness of the left arm as intermittent. Today, he thinks the weakness is not that noticeable. He finds it hard to grab the clutch of his motorcycle at times. The swishing sound of  the left ear is more noticeable at night, but he can hear it during the day as well. So loud at times he has to wear headphones to go to sleep.As far as h/o afib, he rarely notices an irregular heart beat..CHA2DS2VASc 1-2. On ASA. Longstanding h/o tobacco abuse.  Today, he denies symptoms of palpitations, chest pain, shortness of breath, orthopnea, PND, lower extremity edema, dizziness, presyncope, syncope, or neurologic sequela. The patient is tolerating medications without difficulties and is otherwise without complaint today.   Past Medical History  Diagnosis Date  . HTN (hypertension)   . Chest pain 2003    neg evaluation  . Paroxysmal atrial fibrillation   . Hyperlipemia   . Carotid artery occlusion     Right Carotid Bruit  . Subclavian steal syndrome Aug. 2015    Occult Right   Past Surgical History  Procedure Laterality Date  . Cystectomy      removed from back  . Repair patellar tendon      right  . Colonoscopy w/ polypectomy  2003  . Colonoscopy  10/2007    negative results  . Rotator cuff repair    . Epidural steroids      in the back x3  . Joint replacement Right February 29, 1976    KNEE    Current Outpatient Prescriptions  Medication Sig Dispense  Refill  . amoxicillin (AMOXIL) 500 MG tablet Take 500 mg by mouth 2 (two) times daily.        [name]. aspirin 81 MG tablet Take 81 mg by mouth daily.      . simvastatin (ZOCOR) 10 MG tablet Take 10 mg by mouth daily.      [name]. buPROPion (WELLBUTRIN SR) 150 MG 12 hr tablet 3 times daily for 3 days then decrease to twice daily.  63 tablet  6   No current facility-administered medications for this visit.    Allergies  Allergen Reactions  . Tetanus Toxoid Other (See Comments)    Convulsions and fevers    History   Social History  . Marital Status: Divorced    Spouse Name: N/A    Number of Children: N/A  . Years of Education: N/A   Occupational History  . 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 Smoking status: Current Every Day Smoker -- 0.50 packs/day for 20 years    Types: Cigarettes  . Smokeless tobacco: Never Used  . Alcohol Use: Yes  . Drug Use: No  . Sexual Activity: Not on file   Other Topics Concern  . Not on file   Social History Narrative  . No narrative on file    Family History  Problem  Relation Age of Onset  . Heart disease Father     After age 57  . Heart attack Father   . Cancer Brother   . Stroke Brother     ROS- All systems are reviewed and negative except as per the HPI above  Physical Exam: Filed Vitals:   09/10/14 1249  BP: 137/90  Pulse: 69  Height: 5\' 8"  (1.727 m)  Weight: 178 lb (80.74 kg)    GEN- The patient is well appearing, alert and oriented x 3 today.   Head- normocephalic, atraumatic Eyes-  Sclera clear, conjunctiva pink Ears- hearing intact Oropharynx- clear Neck- supple, no JVP. Bruit positive rt carotid. Lymph- no cervical lymphadenopathy Lungs- Clear to ausculation bilaterally, normal work of breathing Heart- Regular rate and rhythm, no murmurs, rubs or gallops, PMI not laterally displaced GI- soft, NT, ND, + BS Extremities- no clubbing, cyanosis, or edema MS- no significant deformity or atrophy Skin- no rash  or lesion Psych- euthymic mood, full affect Neuro- strength and sensation are intact  EKG-NSR at 63 bpm, possible anterior infarct, age undetermined. Epic records reviewed.  Assessment and Plan: 1. H/o afib but without any evidence for any significant afib burden and current symptoms do not sound related. Chadsvasc 1-2. Continue asa for now. Left arm weakness intermittent, questionable etiology,   may be more likely to be related to a cervical radiculopathy.  2. Abnormal sound, high pitched, left ear with h/o carotid disease Called Dr. Estanislado SpireBrabham's office due to fact pt thought he was being scheduled for CT angiogram of the head and neck, per note 08/16/14, but has not heard of a date. The office there will follow up and notify pt.  This is a new diagnosis and carries risks of vascular abnormality as etiology.  3. Smoking abuse with desire for cessation GXT to evaluated cardiac status. Wellbutrin SR 150 mg bid x 3 days then 150 mg bid for smoking cessation, 6 refills.  4. HTN Lifestyle modification is encouraged today including regular exercise, weight reduction, smoking cessation, and salt restriction.   I have seen, examined the patient, and reviewed the above assessment and plan with Rudi Cocoonna Carroll NP.  Changes to above are made where necessary.   Given minimal symptoms with AF and low chads2vasc score, no medicine changes are advised today.   Smoking cessation was strongly advised.  > 10 minutes was spent in counseling regarding cessation with risks associated with tobacco use, benefits of cessation, and treatment strategies including but not limited to chantix and wellbutrin discussed.  He would like to try wellbutrin again as he was able to quit previously with this medicine.  I have spoken to both Drs Pearlean BrownieSethi and Tia MaskerKrouse today regarding his high pitched pulsatile tinnitus.  We agree with Dr Myra GianottiBrabham that CTA of the head and neck and the best test to further evaluate this.  I have therefore  encouraged compliance with the test previously ordered by Dr Myra GianottiBrabham.  He will return to see Norma FredricksonLori Gerhardt in 6 months.  I will see again in a year  Co Sign: Hillis RangeAllred, Minnette Merida, MD 09/11/2014 11:36 AM

## 2014-09-11 ENCOUNTER — Encounter: Payer: Self-pay | Admitting: Internal Medicine

## 2014-09-11 DIAGNOSIS — H9312 Tinnitus, left ear: Secondary | ICD-10-CM | POA: Insufficient documentation

## 2014-09-17 ENCOUNTER — Ambulatory Visit
Admission: RE | Admit: 2014-09-17 | Discharge: 2014-09-17 | Disposition: A | Discharge status: Home / Self Care | Payer: BC Managed Care – PPO | Source: Ambulatory Visit | Attending: Surgery | Admitting: Surgery

## 2014-09-17 ENCOUNTER — Inpatient Hospital Stay: Admission: RE | Admit: 2014-09-17 | Payer: BC Managed Care – PPO | Source: Ambulatory Visit

## 2014-09-17 ENCOUNTER — Other Ambulatory Visit: Payer: BC Managed Care – PPO

## 2014-09-17 DIAGNOSIS — Z01818 Encounter for other preprocedural examination: Secondary | ICD-10-CM

## 2014-09-17 DIAGNOSIS — I6523 Occlusion and stenosis of bilateral carotid arteries: Secondary | ICD-10-CM

## 2014-09-17 MED ORDER — IOHEXOL 350 MG/ML SOLN
80.0000 mL | Freq: Once | INTRAVENOUS | Status: AC | PRN
  Administered 2014-09-17: 80 mL via INTRAVENOUS

## 2014-09-27 NOTE — Progress Notes (Signed)
Pt. Called to request results of the CTA of head/neck.  Dr. Myra GianottiBrabham reviewed.  Stated the CTA looked fine, and to schedule pt. for 1 yr. Carotid duplex and office visit.  Left voice message for pt. with recommended f/u.

## 2014-09-28 ENCOUNTER — Telehealth: Payer: Self-pay | Admitting: Surgery

## 2014-09-28 NOTE — Telephone Encounter (Addendum)
-----   Message from Erenest Blankarol Sue Pullins, RN sent at 09/27/2014  3:49 PM EST ----- Regarding: needs 1 yr. f/u with Dr. Ilda FoilBrabham Contact: [phone] This pt. inquired about results of his CTA head/neck.  In reviewing with Dr. Myra GianottiBrabham, he recommended a 1 yr. f/u with a carotid ultrasound.  (I left msg. with the pt. that someone would be calling about his 1 yr. f/u)   09/28/14: mailed pw with appt, left msg, dpm

## 2014-10-11 ENCOUNTER — Ambulatory Visit (INDEPENDENT_AMBULATORY_CARE_PROVIDER_SITE_OTHER): Payer: BC Managed Care – PPO | Admitting: Physician Assistant

## 2014-10-11 DIAGNOSIS — Z136 Encounter for screening for cardiovascular disorders: Secondary | ICD-10-CM

## 2014-10-11 DIAGNOSIS — I739 Peripheral vascular disease, unspecified: Secondary | ICD-10-CM

## 2014-10-11 DIAGNOSIS — I480 Paroxysmal atrial fibrillation: Secondary | ICD-10-CM

## 2014-10-11 NOTE — Progress Notes (Signed)
Exercise Treadmill Test  Pre-Exercise Testing Evaluation Rhythm: normal sinus  Rate: 74     Test  Exercise Tolerance Test Ordering MD: Hillis RangeJames Allred, MD  Interpreting MD: Tereso NewcomerScott Cosmo Tetreault, PA-C  Unique Test No: 1  Treadmill:  1  Indication for ETT: left arm weakness, PAF, PVD, screening for cardiovascular condition  Contraindication to ETT: No   Stress Modality: exercise - treadmill  Cardiac Imaging Performed: non   Protocol: standard Bruce - maximal  Max BP:  195/63  Max MPHR (bpm):  164 85% MPR (bpm):  139  MPHR obtained (bpm):  150 % MPHR obtained:  91  Reached 85% MPHR (min:sec):  7:13 Total Exercise Time (min-sec):  8:00  Workload in METS:  10.1 Borg Scale: 13  Reason ETT Terminated:  leg cramps    ST Segment Analysis At Rest: normal ST segments - no evidence of significant ST depression With Exercise: no evidence of significant ST depression  Other Information Arrhythmia:  No Angina during ETT:  absent (0) Quality of ETT:  diagnostic  ETT Interpretation:  normal - no evidence of ischemia by ST analysis  Comments: Good exercise capacity. No chest pain. Normal BP response to exercise. No ST changes to suggest ischemia.  Exercise limited by bilateral "leg cramps."  Symptoms suspicious for claudication.  Recommendations: With hx of tobacco smoking and bilateral calf pain with activity, would strongly consider proceeding with ABIs. This will be arranged. FU with Dr. Hillis RangeJames Allred as planned. Signed,  Tereso NewcomerScott Patrik Turnbaugh, PA-C   10/11/2014 9:59 AM

## 2014-10-11 NOTE — Patient Instructions (Signed)
Your physician has requested that you have a lower extremity arterial duplex THIS IS TO BE DONE WITH ABI'S This test is an ultrasound of the arteries in the legs or arms. It looks at arterial blood flow in the legs and arms. Allow one hour for Lower and Upper Arterial scans. There are no restrictions or special instructions

## 2014-10-18 ENCOUNTER — Ambulatory Visit (HOSPITAL_COMMUNITY): Payer: BC Managed Care – PPO | Attending: Physician Assistant | Admitting: Cardiology

## 2014-10-18 DIAGNOSIS — I739 Peripheral vascular disease, unspecified: Secondary | ICD-10-CM | POA: Insufficient documentation

## 2014-10-18 NOTE — Progress Notes (Signed)
LEA Doppler/ABI performed 

## 2014-10-22 ENCOUNTER — Encounter: Payer: Self-pay | Admitting: Physician Assistant

## 2015-05-30 ENCOUNTER — Other Ambulatory Visit: Payer: Self-pay | Admitting: Internal Medicine

## 2015-05-30 NOTE — Telephone Encounter (Signed)
Hi Kelly, Patient is requesting refill for his Wellbutrin. I cannot refill this. Will you take a look? Thank you for your time.

## 2015-05-31 NOTE — Telephone Encounter (Signed)
3. Smoking abuse with desire for cessation GXT to evaluated cardiac status. Wellbutrin SR 150 mg bid x 3 days then 150 mg bid for smoking cessation, 6 refills.  I would give him 1 refill and call him and let him know he will need to get additional refills from PCP if he has still been unable to quit smoking

## 2015-06-01 ENCOUNTER — Telehealth: Payer: Self-pay

## 2015-06-01 ENCOUNTER — Other Ambulatory Visit: Payer: Self-pay

## 2015-06-01 DIAGNOSIS — I480 Paroxysmal atrial fibrillation: Secondary | ICD-10-CM

## 2015-06-01 MED ORDER — BUPROPION HCL ER (SR) 150 MG PO TB12: ORAL_TABLET | ORAL | Status: DC

## 2015-06-01 NOTE — Telephone Encounter (Signed)
SPOKE WITH PT ABOUT HIS REFILL REQUEST FOR WELLBUTRIN. I LET PATIENT KNOW THAT WE SENT IN ONE REFILL BUT HE WOULD NEED TO FOLLOW UP WITH HIS PRIMARY CARE PHYSICIAN TO OBTAIN FUTURE REFILLS. (PER KELLY)

## 2015-06-28 ENCOUNTER — Other Ambulatory Visit: Payer: Self-pay | Admitting: Internal Medicine

## 2015-10-03 ENCOUNTER — Ambulatory Visit: Payer: BC Managed Care – PPO | Admitting: Surgery

## 2015-10-03 ENCOUNTER — Encounter (HOSPITAL_COMMUNITY): Payer: Self-pay

## 2015-10-18 ENCOUNTER — Encounter: Payer: Self-pay | Admitting: Surgery

## 2015-10-19 ENCOUNTER — Other Ambulatory Visit: Payer: Self-pay | Admitting: *Deleted

## 2015-10-19 DIAGNOSIS — I771 Stricture of artery: Secondary | ICD-10-CM

## 2015-10-20 ENCOUNTER — Telehealth: Payer: Self-pay | Admitting: Internal Medicine

## 2015-10-20 NOTE — Telephone Encounter (Signed)
New Message  Pt c/o of Chest Presure 1. Are you having CP right now? No  2. Are you experiencing any other symptoms (ex. SOB, nausea, vomiting, sweating)? No  3. How long have you been experiencing CP? Just an epsiode 4. Is your CP continuous or coming and going? Just an episode  5. Have you taken Nitroglycerin? No   Comments: Pt called states that he had chest pressure for 1 hour and 20 minutes last night 10/19/2015. Per pt he checked BP on wristband and he states that he recorded the readings.    249/115 Bp and Heart rate 109 at 9pm 239/129 Bp and Heart rate 132  at 9:10pm 228/139 Bp and Heart rate 129 at 9:15p 198/103 Bp and Heart rate 122 at 9:40p 138/90   Bp and Heart rate 79 at 10:05p   This morning when he rose the rate was 122/81 pulse 79

## 2015-10-21 ENCOUNTER — Ambulatory Visit: Payer: Self-pay | Admitting: Nurse Practitioner

## 2015-10-21 ENCOUNTER — Encounter: Payer: Self-pay | Admitting: Physician Assistant

## 2015-10-21 ENCOUNTER — Ambulatory Visit (INDEPENDENT_AMBULATORY_CARE_PROVIDER_SITE_OTHER): Payer: Self-pay | Admitting: Physician Assistant

## 2015-10-21 VITALS — BP 150/94 | HR 78 | Ht 68.0 in | Wt 185.6 lb

## 2015-10-21 DIAGNOSIS — I10 Essential (primary) hypertension: Secondary | ICD-10-CM

## 2015-10-21 DIAGNOSIS — E785 Hyperlipidemia, unspecified: Secondary | ICD-10-CM

## 2015-10-21 DIAGNOSIS — R0789 Other chest pain: Secondary | ICD-10-CM

## 2015-10-21 LAB — BASIC METABOLIC PANEL
BUN: 15 mg/dL (ref 7–25)
CALCIUM: 9.7 mg/dL (ref 8.6–10.3)
CO2: 26 mmol/L (ref 20–31)
CREATININE: 0.88 mg/dL (ref 0.70–1.33)
Chloride: 106 mmol/L (ref 98–110)
GLUCOSE: 87 mg/dL (ref 65–99)
POTASSIUM: 4.2 mmol/L (ref 3.5–5.3)
Sodium: 140 mmol/L (ref 135–146)

## 2015-10-21 MED ORDER — METOPROLOL TARTRATE 25 MG PO TABS: 25.0000 mg | ORAL_TABLET | Freq: Two times a day (BID) | ORAL | Status: DC

## 2015-10-21 MED ORDER — SIMVASTATIN 10 MG PO TABS: 10.0000 mg | ORAL_TABLET | Freq: Every day | ORAL | Status: DC

## 2015-10-21 MED ORDER — LISINOPRIL 10 MG PO TABS: 10.0000 mg | ORAL_TABLET | Freq: Every day | ORAL | Status: DC

## 2015-10-21 NOTE — Telephone Encounter (Signed)
Patient scheduled to see PA on 12/9

## 2015-10-21 NOTE — Patient Instructions (Addendum)
Medication Instructions:  Your physician has recommended you make the following change in your medication:  1. Discontinue Metoprolol succinate and start Metoprolol tartrate ( 25 mg ) twice a day 2.  Start Lisinopril ( 10 mg ) daily 3. Restart Asprin ( 81 mg ) daily 4. Zocor was refilled today along with all other medications   Labwork: Your physician recommends that you have lab work today: bmet   Testing/Procedures: Your physician has requested that you have an echocardiogram. Echocardiography is a painless test that uses sound waves to create images of your heart. It provides your doctor with information about the size and shape of your heart and how well your heart's chambers and valves are working. This procedure takes approximately one hour. There are no restrictions for this procedure.    Follow-Up: Your physician recommends that you keep your scheduled  follow-up appointment with Tereso NewcomerScott Weaver, PA- C    Any Other Special Instructions Will Be Listed Below (If Applicable).   Keep follow up with Dr. Myra GianottiBrabham Seek medical help if chest pressure continues for 20 minutes or more Need to follow up with your PCP in the next two weeks.   If you need a refill on your cardiac medications before your next appointment, please call your pharmacy.

## 2015-10-21 NOTE — Progress Notes (Signed)
Cardiology Office Note   Date:  10/21/2015   ID:  [name], DOB 1956/03/23, MRN [id number]  PCP:  Tommy Rainwater, MD  Cardiologist:  Dr. Johney Frame   Chief Complaint  Patient presents with  . Chest Pain  . Follow-up    hypertension, hyperlipidemia, h/o PAF      History of Present Illness: [name] is a 59 y.o. male who presents today in [facility] for evaluation of chest pain. He smoked for last 15 years. He has a PMH of paroxysmal atrial fibrillation on ASA since 2012, HTN, HLD and developing R subclavian steal syndrome. She was evaluated in 2015 for bruit on the right side and intermittent L sided weakness. He also had swishing sound in L ear as well. He had carotid U/S on 06/17/2014 which showed minor carotid atherosclerosis, no hemodynamic significant ICA stenosis, U/S finding suggested developing occult R subclavian steal syndrome. He was seen by Dr. Myra Gianotti in 08/2014, his symptom was felt not consistent with subclavian steal syndrome.   Since last year, he has dropped his insurance earlier this year and subsequently stopped all of his medication. He was previously on diltiazem. He has been taking metoprolol succinate  daily sporadically. But he eventually "weaned" him off of all medication in June 2016. Since then, he has been having uncontrolled HTN and chest pressure. The chest pressure does not occur with exertion and typically associated with high SBP in 190s or above. He denies any recent stroke like symptom include ipsilateral weakness (he does have chronic LUE weakness unchanged from 2015), slurring of speech or significant headache/vision changes. The last episode of chest pressure was 2 days ago. He described it as L sided localized chest pressure, not exacerbated or alleviated with body rotation, palpation, sitting up/lying down or rest. Each episode occurs a hour at a time.   He presented to cardiology [facility] for evaluation of chest pain.  Fortunately, he decided to cut back on cigarette and currently smoke 2 cigarette per day.     Past Medical History  Diagnosis Date  . HTN (hypertension)   . Chest pain 2003    neg evaluation  . Paroxysmal atrial fibrillation (HCC)   . Hyperlipemia   . Carotid artery occlusion     Right Carotid Bruit  . Subclavian steal syndrome Aug. 2015    Occult Right  . Leg pain     a. ABIs (12/10):  Normal    Past Surgical History  Procedure Laterality Date  . Cystectomy      removed from back  . Repair patellar tendon      right  . Colonoscopy w/ polypectomy  2003  . Colonoscopy  10/2007    negative results  . Rotator cuff repair    . Epidural steroids      in the back x3  . Joint replacement Right February 29, 1976    KNEE     Current Outpatient Prescriptions  Medication Sig Dispense Refill  . aspirin 81 MG tablet Take 81 mg by mouth daily.    . simvastatin (ZOCOR) 10 MG tablet Take 1 tablet (10 mg total) by mouth daily. 30 tablet 11  . lisinopril (PRINIVIL,ZESTRIL) 10 MG tablet Take 1 tablet (10 mg total) by mouth daily. 30 tablet 6  . metoprolol tartrate (LOPRESSOR) 25 MG tablet Take 1 tablet (25 mg total) by mouth 2 (two) times daily. 60 tablet 11   No current facility-administered medications for this visit.  Allergies:   Tetanus toxoid    Social History:  The patient  reports that he has been smoking Cigarettes.  He has a 10 pack-year smoking history. He has never used smokeless tobacco. He reports that he drinks alcohol. He reports that he does not use illicit drugs.   Family History:  The patient's family history includes Cancer in his brother; Heart attack in his father; Heart disease in his father; Stroke in his brother.    ROS:  Please see the history of present illness.   Otherwise, review of systems are positive for chest pressure, uncontrolled high blood pressure.   All other systems are reviewed and negative.    PHYSICAL EXAM: VS:  BP 150/94 mmHg  Pulse 78   Ht 5\' 8"  (1.727 m)  Wt 185 lb 9.6 oz (84.188 kg)  BMI 28.23 kg/m2 , BMI Body mass index is 28.23 kg/(m^2). GEN: Well nourished, well developed, in no acute distress HEENT: normal Neck: no JVD, or masses +R carotid bruit Cardiac: RRR; no murmurs, rubs, or gallops,no edema  R upper chest bruit near clavicle. Respiratory:  clear to auscultation bilaterally, normal work of breathing GI: soft, nontender, nondistended, + BS MS: no deformity or atrophy Skin: warm and dry, no rash Neuro:  Strength and sensation are intact Psych: euthymic mood, full affect   EKG:  EKG is ordered today. The ekg ordered today demonstrates NSR with deeper TWI in lead III and T wave flattening in aVF. Continue to have poor R wave progression in anterior lead unchanged from 2015.   Recent Labs: No results found for requested labs within last 365 days.    Lipid Panel    Component Value Date/Time   CHOL 216* 10/04/2008 0847   TRIG 139 10/04/2008 0847   HDL 42.6 10/04/2008 0847   CHOLHDL 5.1 CALC 10/04/2008 0847   VLDL 28 10/04/2008 0847   LDLDIRECT 120.9 10/04/2008 0847      Wt Readings from Last 3 Encounters:  10/21/15 185 lb 9.6 oz (84.188 kg)  09/10/14 178 lb (80.74 kg)  08/16/14 177 lb (80.287 kg)      Other studies Reviewed: Additional studies/ records that were reviewed today include:   Echo 10/15/2011 LV EF: 55% -  60%  ------------------------------------------------------------ Indications:   Atrial fibrillation - 427.31.  ------------------------------------------------------------ History:  PMH: Acquired from the patient and from the patient's chart. Atrial fibrillation. Risk factors: Family history of coronary artery disease. Current tobacco use. Dyslipidemia.  ------------------------------------------------------------ Study Conclusions  Left ventricle: The cavity size was normal. Wall thickness was normal. Systolic function was normal. The estimated ejection  fraction was in the range of 55% to 60%. Wall motion was normal; there were no regional wall motion abnormalities. Doppler parameters are consistent with abnormal left ventricular relaxation (grade 1 diastolic dysfunction).     ETT 10/2014  Expand All Collapse All   Exercise Treadmill Test  Pre-Exercise Testing Evaluation Rhythm: normal sinus  Rate: 74     Test  Exercise Tolerance Test Ordering MD: Hillis RangeJames Allred, MD  Interpreting MD: Tereso NewcomerScott Weaver, PA-C  Unique Test No: 1  Treadmill: 1  Indication for ETT: left arm weakness, PAF, PVD, screening for cardiovascular condition  Contraindication to ETT: No   Stress Modality: exercise - treadmill  Cardiac Imaging Performed: non   Protocol: standard Bruce - maximal  Max BP: 195/63  Max MPHR (bpm): 164 85% MPR (bpm): 139  MPHR obtained (bpm): 150 % MPHR obtained: 91  Reached 85% MPHR (min:sec): 7:13 Total Exercise  Time (min-sec): 8:00  Workload in METS: 10.1 Borg Scale: 13  Reason ETT Terminated: leg cramps    ST Segment Analysis At Rest:normal ST segments - no evidence of significant ST depression With Exercise:no evidence of significant ST depression  Other Information Arrhythmia: NoAngina during ETT: absent (0) Quality of ETT: diagnostic  ETT Interpretation: normal - no evidence of ischemia by ST analysis  Comments: Good exercise capacity. No chest pain. Normal BP response to exercise. No ST changes to suggest ischemia.  Exercise limited by bilateral "leg cramps." Symptoms suspicious for claudication.     Bilateral carotid U/S 06/17/2014 IMPRESSION: Minor carotid atherosclerosis. No hemodynamically significant ICA stenosis by ultrasound. Degree of narrowing left and 50% bilaterally.  Ultrasound findings suggesting an occult developing right subclavian steal syndrome.  CTA of neck 09/18/2015 IMPRESSION: 1. Prominent plaque in the right subclavian  artery just proximal to the vertebral artery origin resulting in approximately 60% subclavian artery stenosis. This combined with abnormal right vertebral artery waveform on ultrasound is consistent with developing subclavian steal syndrome. 2. Mild cervical carotid atherosclerosis without stenosis. 3. Unremarkable head CTA.  Review of the above records demonstrates:  Normal EF and ETT before.    ASSESSMENT AND PLAN:  1. Chest pressure at rest in the setting of uncontrolled HTN  - likely related to uncontrolled HTN, right now control BP is the goal. Symptom atypical for angina. Reviewed prior ETT in 10/2014 which was normal. EKG showed nonspecific changes include deeper TWI in lead 3 and T wave flattening in aVF. If he has chest pain despite controlling BP then he will need ischemic workup, otherwise will hold off on myoview for now. Case discussed with Dr. Tenny Craw DOD who was agreeable.   2. Uncontrolled HTN after stopping all cardiac meds  - discussed sign and symptom of stroke, he does not have obvious neurological deficit today. One of the reason he stopped taking metoprolol succinate is because it cost >$90 at [facility]. I will change it to metoprolol tartrate  BID and add lisinopril  daily. Given the fact, he has not had BP med for 6 month, I will order an echocardiogram. His last echo was in 2012. Will obtain BMET today  3. H/o PAF on ASA: CHA2DS2-Vasc score 1 (HTN), continue ASA, he has not been taking at home, i went over the benefit of ASA with him, he agrees to start back on  ASA  4. Hyperlipidemia: he has not seen his PCP in several month and running out of crestor, I will refill it for him, but he need to followup with his PCP. I did not obtain a Lipid panel today as he already ate  5. R subclavian steal syndrome: L arm BP 150/94, R arm BP 140/100 today. Per pt, he is scheduled to see Dr. Myra Gianotti   Current medicines are reviewed at length with the patient today.  The patient  does not have concerns regarding medicines.  The following changes have been made:  Switch metoprolol succinate to metoprolol tartrate  BID and add lisinopril  daily.   Labs/ tests ordered today include:   Orders Placed This Encounter  Procedures  . Basic Metabolic Panel (BMET)  . EKG 12-Lead  . Echocardiogram     Disposition:   FU with me in 2 weeks  Ramond Dial PA 10/21/2015 5:08 PM    [facility] [address] [place], [place], [place]  [address] Phone: [phone]; Fax: [phone]

## 2015-10-24 ENCOUNTER — Ambulatory Visit (INDEPENDENT_AMBULATORY_CARE_PROVIDER_SITE_OTHER): Payer: Self-pay | Admitting: Surgery

## 2015-10-24 ENCOUNTER — Encounter: Payer: Self-pay | Admitting: Surgery

## 2015-10-24 ENCOUNTER — Telehealth: Payer: Self-pay | Admitting: *Deleted

## 2015-10-24 ENCOUNTER — Ambulatory Visit (HOSPITAL_COMMUNITY)
Admission: RE | Admit: 2015-10-24 | Discharge: 2015-10-24 | Disposition: A | Discharge status: Home / Self Care | Payer: Self-pay | Source: Ambulatory Visit | Attending: Surgery | Admitting: Surgery

## 2015-10-24 VITALS — BP 129/67 | HR 76 | Ht 68.0 in | Wt 182.0 lb

## 2015-10-24 DIAGNOSIS — I708 Atherosclerosis of other arteries: Secondary | ICD-10-CM

## 2015-10-24 DIAGNOSIS — I771 Stricture of artery: Secondary | ICD-10-CM

## 2015-10-24 DIAGNOSIS — I10 Essential (primary) hypertension: Secondary | ICD-10-CM | POA: Insufficient documentation

## 2015-10-24 DIAGNOSIS — E785 Hyperlipidemia, unspecified: Secondary | ICD-10-CM | POA: Insufficient documentation

## 2015-10-24 DIAGNOSIS — I6523 Occlusion and stenosis of bilateral carotid arteries: Secondary | ICD-10-CM | POA: Insufficient documentation

## 2015-10-24 NOTE — Telephone Encounter (Signed)
-----   Message from [place] Springs[place]ao M[place], [place] sent at 10/24/2015  9:29 AM EST ----- Kidney function stable, electrolyte normal. Despite prolonged period of uncontrolled high blood pressure, it appears it has not caused significant injury to the kidney. I started on lisinopril during last admission which is ok since kidney function is stable.

## 2015-10-24 NOTE — Progress Notes (Signed)
HISTORY AND PHYSICAL     CC:  F/u ultrasound Referring Provider:  Joaquim Lai*  HPI: This is a 59 y.o. male with a hx of right subclavian artery stenosis who presents today for f/u.  His original complaint was hearing blood flow in his left ear when he sleeps.  He states he only hears this occasionally now.  He continues to have left hand weakness when trying to use the clutch on his motorcycle.  He states he has had testing of the left hand.   He does not have any right arm weakness.  He denies any dizziness.  He has not had any neurological symptoms.    He states that last week he had chest pressure with his blood pressure reaching the 240's systolic and 120's diastolic.  He made an appt with his cardiologist and his medications were adjusted/added.  He is scheduled for an echo and other tests per the pt.  He is still smoking, but is down to ~ 5 cigarettes per day.  He wants to quit and feels the events of last week were an eye opener.    He is on an ACEI and beta blocker for hypertension.  He is on a statin for cholesterol management.  He is on a daily aspirin.  Past Medical History  Diagnosis Date  . HTN (hypertension)   . Chest pain 2003    neg evaluation  . Paroxysmal atrial fibrillation (HCC)   . Hyperlipemia   . Carotid artery occlusion     Right Carotid Bruit  . Subclavian steal syndrome Aug. 2015    Occult Right  . Leg pain     a. ABIs (12/10):  Normal    Past Surgical History  Procedure Laterality Date  . Cystectomy      removed from back  . Repair patellar tendon      right  . Colonoscopy w/ polypectomy  2003  . Colonoscopy  10/2007    negative results  . Rotator cuff repair    . Epidural steroids      in the back x3  . Joint replacement Right February 29, 1976    KNEE    Allergies  Allergen Reactions  . Tetanus Toxoid Other (See Comments)    Convulsions and fevers    Current Outpatient Prescriptions  Medication Sig Dispense Refill  .  aspirin 81 MG tablet Take 81 mg by mouth daily.    [name] lisinopril (PRINIVIL,ZESTRIL) 10 MG tablet Take 1 tablet (10 mg total) by mouth daily. 30 tablet 6  . metoprolol tartrate (LOPRESSOR) 25 MG tablet Take 1 tablet (25 mg total) by mouth 2 (two) times daily. 60 tablet 11  . simvastatin (ZOCOR) 10 MG tablet Take 1 tablet (10 mg total) by mouth daily. 30 tablet 11   No current facility-administered medications for this visit.    Family History  Problem Relation Age of Onset  . Heart disease Father     After age 55  . Heart attack Father   . Cancer Brother   . Stroke Brother     Social History   Social History  . Marital Status: Divorced    Spouse Name: N/A  . Number of Children: N/A  . Years of Education: N/A   Occupational History  . Tour manager   Social History Main Topics  . Smoking status: Current Every Day Smoker -- 0.50 packs/day for 20 years    Types: Cigarettes  . Smokeless  tobacco: Never Used  . Alcohol Use: Yes  . Drug Use: No  . Sexual Activity: Not on file   Other Topics Concern  . Not on file   Social History Narrative     ROS:  Positive    Negative    All sytems reviewed and are negative  Cardiovascular:  chest pain/pressure  palpitations  SOB lying flat  DOE  pain in legs while walking  pain in feet when lying flat  hx of DVT  hx of phlebitis  swelling in legs  varicose veins  Pulmonary:  productive cough  asthma  wheezing  Neurologic:  weakness in  left hand - see HPI  legs  numbness in  arms  legs difficulty speaking or slurred speech  temporary loss of vision in one eye  dizziness  Hematologic:  bleeding problems  problems with blood clotting easily  GI  vomiting blood  blood in stool  GU:  burning with urination  blood in urine  Psychiatric:  hx of major depression  Integumentary:  rashes  ulcers  Constitutional:  fever   chills   PHYSICAL EXAMINATION:  Filed Vitals:   10/24/15 1604 10/24/15 1606  BP: 113/76 129/67  Pulse: 76    Body mass index is 27.68 kg/(m^2).  General:  WDWN in NAD Gait: Not observed HENT: WNL, normocephalic Pulmonary: normal non-labored breathing , without Rales, rhonchi,  wheezing Cardiac: RRR, without  Murmurs, rubs or gallops; with bilateral carotid bruits Abdomen: soft, NT, no masses Skin: with rashes Vascular Exam/Pulses:  Right Left  Radial 2+ (normal) 2+ (normal)  Ulnar 1+ (weak) 1+ (weak)   Extremities: without ischemic changes, without Gangrene , without cellulitis; without open wounds;  Musculoskeletal: no muscle wasting or atrophy  Neurologic: A&O X 3; Appropriate Affect ; SENSATION: normal; MOTOR FUNCTION:  moving all extremities equally. Speech is fluent/normal   Non-Invasive Vascular Imaging:   Carotid Duplex 10/24/15: Right subclavian artery 270cm/s with dampened waveform, left subclavian artery 137cm/s triphasic waveform.  Tortuous ICA bilaterally  -less than 40% BICA stenosis -left ECA stenosis  Pt meds includes: Statin:  Yes.   Beta Blocker:  Yes.   Aspirin:  Yes.   ACEI:  Yes.   ARB:  No. Other Antiplatelet/Anticoagulant:  No.    ASSESSMENT/PLAN:: 59 y.o. male with right subclavian artery stenosis, which is asymptomatic   -he does have some weakness of the left hand that comes and goes, which is unchanged from last year.  His stenosis of his subclavian artery is on the right.  This is most likely not related to blood flow. -he does have bilateral carotid bruits and his duplex today reveals < 40% stenosis bilaterally -encouraged the pt on smoking cessation and he is eager to quit.   -he is also eager to get started on lifestyle changes including better eating habits and exercise. -he will f/u in one year with repeat duplex.  He will call sooner if needed. -continue BP control per cardiology.  Pt has echo scheduled per cardiology.      Doreatha Massed, PA-C [facility] [phone]  Clinic MD:  Pt seen and examined in conjunction with Dr. Myra Gianotti  I agree with the above.  I have seen and evaluated the patient.  I have been following him for a right subclavian stenosis which is asymptomatic.  He also has left hand weakness on occasion which is most likely not related to vascular issues.  He has had a CT scan of the  head and neck which was negative for stroke and showed approximate 60% right subclavian stenosis.  The patient's ultrasound today shows no significant changes.  He will follow up in one year with surveillance.  We discussed the importance of smoking cessation.  He states that he will quit this year.  Durene CalWells Corliss Lamartina

## 2015-10-24 NOTE — Telephone Encounter (Signed)
Spoke with pt and he is aware that his kidney function is normal, despite the high blood pressure he had.  Pt was advised to stay on the Lisinopril and watch what he eats to keep his bp under control.  Pt verbalized understanding.

## 2015-10-25 NOTE — Addendum Note (Signed)
Addended by: Adria DillELDRIDGE-LEWIS, Shawnika Pepin L on: 10/25/2015 10:18 AM   Modules accepted: Orders

## 2015-11-01 ENCOUNTER — Encounter: Payer: Self-pay | Admitting: Physician Assistant

## 2015-11-01 ENCOUNTER — Ambulatory Visit (INDEPENDENT_AMBULATORY_CARE_PROVIDER_SITE_OTHER): Payer: Self-pay | Admitting: Physician Assistant

## 2015-11-01 VITALS — BP 118/80 | HR 68 | Ht 68.0 in | Wt 183.4 lb

## 2015-11-01 DIAGNOSIS — I10 Essential (primary) hypertension: Secondary | ICD-10-CM

## 2015-11-01 NOTE — Addendum Note (Signed)
Addended byLisabeth Devoid: Mariella Blackwelder on: 11/01/2015 10:39 AM   Modules accepted: Kipp BroodSmartSet

## 2015-11-01 NOTE — Patient Instructions (Addendum)
Medication Instructions:  No changes. Continue your current medications as listed.  Labwork: None today.  Testing/Procedures: Please get your echocardi[place] done on November 10, 2015 at 2:00 pm [place]  Follow-Up: Dr. Hillis RangeJames Allred in 3 months.    Any Other Special Instructions Will Be Listed Below (If Applicable).  Please call us if you have further chest pain for a sooner follow up.

## 2015-11-01 NOTE — Progress Notes (Signed)
Cardiology Office Note   Date:  11/01/2015   ID:  [name], DOB 04/19/1956, MRN [id number]  PCP:  Tommy Rainwater, MD  Cardiologist:  Dr. Johney Frame   Chief Complaint  Patient presents with  . Hypertension  . Follow-up    seen for Dr. Johney Frame      History of Present Illness: [name] is a 60 y.o. male who presents for 2 weeks followup after seen in [facility] on 12/9 for evaluation of chest pain. He smoked for last 15 years. He has a PMH of paroxysmal atrial fibrillation on ASA since 2012, HTN, HLD and developing R subclavian steal syndrome. She was evaluated in 2015 for bruit on the right side and intermittent L sided weakness. He also had swishing sound in L ear as well. He had carotid U/S on 06/17/2014 which showed minor carotid atherosclerosis, no hemodynamic significant ICA stenosis, U/S finding suggested developing occult R subclavian steal syndrome. He was seen by Dr. Myra Gianotti in 08/2014, his symptom was felt not consistent with subclavian steal syndrome. He has chronic LUE weakness unchanged since 2015.  During visit on 12/9, patient admits to have stopped all of his medications due to insurance reasons. The metoprolol succinate was too expensive for him. I started him on metoprolol  BID and lisinopril  daily for uncontrolled HTN which likely attribute to his chest discomfort. Outpatient echocardiogram was ordered and currently pending. He is trying lifestyle change and has cut back on smoking. He saw Dr. Myra Gianotti on 10/24/2015 and his LUE is felt to be unrelated to R subclavian stenosis, 1 year followup was planned. He has been doing well at home. He denies any chest pain since last visit. His echo is scheduled on 11/10/2015. He has been compliant with his BP medication and SBP has been around 110s.     Past Medical History  Diagnosis Date  . HTN (hypertension)   . Chest pain 2003    neg evaluation  . Paroxysmal atrial fibrillation (HCC)   .  Hyperlipemia   . Carotid artery occlusion     Right Carotid Bruit  . Subclavian steal syndrome Aug. 2015    Occult Right  . Leg pain     a. ABIs (12/10):  Normal    Past Surgical History  Procedure Laterality Date  . Cystectomy      removed from back  . Repair patellar tendon      right  . Colonoscopy w/ polypectomy  2003  . Colonoscopy  10/2007    negative results  . Rotator cuff repair    . Epidural steroids      in the back x3  . Joint replacement Right February 29, 1976    KNEE     Current Outpatient Prescriptions  Medication Sig Dispense Refill  . aspirin 81 MG tablet Take 81 mg by mouth daily.    [name] lisinopril (PRINIVIL,ZESTRIL) 10 MG tablet Take 1 tablet (10 mg total) by mouth daily. 30 tablet 6  . metoprolol tartrate (LOPRESSOR) 25 MG tablet Take 1 tablet (25 mg total) by mouth 2 (two) times daily. 60 tablet 11  . simvastatin (ZOCOR) 10 MG tablet Take 1 tablet (10 mg total) by mouth daily. 30 tablet 11   No current facility-administered medications for this visit.    Allergies:   Tetanus toxoid    Social History:  The patient  reports that he has been smoking Cigarettes.  He has a 10 pack-year smoking history. He has  never used smokeless tobacco. He reports that he drinks alcohol. He reports that he does not use illicit drugs.   Family History:  The patient's family history includes Cancer in his brother; Heart attack in his father; Heart disease in his father; Stroke in his brother.    ROS:  Please see the history of present illness.   Otherwise, review of systems are positive for none.   All other systems are reviewed and negative.    PHYSICAL EXAM: VS:  BP 118/80 mmHg  Pulse 68  Ht 5\' 8"  (1.727 m)  Wt 183 lb 6.4 oz (83.19 kg)  BMI 27.89 kg/m2 , BMI Body mass index is 27.89 kg/(m^2). GEN: Well nourished, well developed, in no acute distress HEENT: normal Neck: no JVD, carotid bruits, or masses Cardiac: RRR; no murmurs, rubs, or gallops,no edema    Respiratory:  clear to auscultation bilaterally, normal work of breathing GI: soft, nontender, nondistended, + BS MS: no deformity or atrophy Skin: warm and dry, no rash Neuro:  Strength and sensation are intact Psych: euthymic mood, full affect   EKG:  EKG is not ordered today.   Recent Labs: 10/21/2015: BUN 15; Creat 0.88; Potassium 4.2; Sodium 140    Lipid Panel    Component Value Date/Time   CHOL 216* 10/04/2008 0847   TRIG 139 10/04/2008 0847   HDL 42.6 10/04/2008 0847   CHOLHDL 5.1 CALC 10/04/2008 0847   VLDL 28 10/04/2008 0847   LDLDIRECT 120.9 10/04/2008 0847      Wt Readings from Last 3 Encounters:  11/01/15 183 lb 6.4 oz (83.19 kg)  10/24/15 182 lb (82.555 kg)  10/21/15 185 lb 9.6 oz (84.188 kg)      Other studies Reviewed: Additional studies/ records that were reviewed today include: previous office note. Review of the above records demonstrates: BP improved compare to last visit. Echo pending.    ASSESSMENT AND PLAN:  1. Chest pressure at rest in the setting of uncontrolled HTN - likely related to uncontrolled HTN, right now control BP is the goal. Symptom atypical for angina. Reviewed prior ETT in 10/2014 which was normal. Pending echocardiogram on 11/10/2015. Since his BP improved, his chest discomfort has completely resolved. Will hold off on stress test at this time.    2. Hypertension: better controlled after restart BP med, previously uncontrolled hypertension due to noncompliance 2/2 financial reasons. Changed to lisinopril and metoprolol. BP 118/80 today.  3. H/o PAF on ASA: CHA2DS2-Vasc score 1 (HTN), continue ASA  4. Hyperlipidemia: he has not seen his PCP in several month and running out of crestor, I refilled his lipitor during last visit. Will need to obtain lipid panel and LFT on followup  5. R subclavian steal syndrome: followed by Dr. Myra GianottiBrabham, has been doing well at home.   Current medicines are reviewed at length with the  patient today.  The patient does not have concerns regarding medicines.  The following changes have been made:  no change  Labs/ tests ordered today include:  No orders of the defined types were placed in this encounter.     Disposition:   FU with Dr. Johney FrameAllred in 3 months  Signed, Azalee CourseHao Jamell Laymon PA 11/01/2015 10:35 AM    [facility] [address], [place], [place]  [address] Phone: [phone]; Fax: [phone]

## 2015-11-10 ENCOUNTER — Ambulatory Visit (INDEPENDENT_AMBULATORY_CARE_PROVIDER_SITE_OTHER): Payer: Self-pay

## 2015-11-10 ENCOUNTER — Other Ambulatory Visit: Payer: Self-pay

## 2015-11-10 DIAGNOSIS — E785 Hyperlipidemia, unspecified: Secondary | ICD-10-CM

## 2015-11-10 DIAGNOSIS — I10 Essential (primary) hypertension: Secondary | ICD-10-CM

## 2015-11-18 ENCOUNTER — Telehealth: Payer: Self-pay | Admitting: *Deleted

## 2015-11-18 NOTE — Telephone Encounter (Signed)
-----   Message from WeslacoHao Meng, GeorgiaPA sent at 11/11/2015  9:50 PM EST ----- Normal pumping function, normal heart wall motion, normal valves except mildly leaky mitral valve which should not cause any problem at this time. Very reassuring result, which suggest this time the prolonged uncontrolled high blood pressure did not result in any permanent damage of heart. Continue focus on blood pressure management

## 2015-11-18 NOTE — Telephone Encounter (Signed)
Caleld pt, per Azalee CourseHao Meng, PA-C, to let him know his ECHO showed normal heart pumping function, normal wall motion, and normal valves except for mildly leaking mitral valve, which should not cause any trouble at this time.  Pt was advised that he needed to focus on better blood pressure mgmt and keep it under control.  Pt verbalized understanding and declined any questions.

## 2016-01-03 IMAGING — CT CT ANGIO HEAD
1 of 10 series · 5 of 33 positions shown · IV contrast (80CC OMNI 350)
Comparison: Carotid ultrasound 06/17/2014.  Head CT 06/07/2006.

CLINICAL DATA: Bilateral carotid artery stenosis. Pulsating
sensation in left ear. Left arm and hand weakness. Blurry vision.
Short term memory loss for 8-9 months.



[Series 500: axial thin · axial · 0.71mm/px · z∈[-42,+217]mm · 5 of 389 slices shown]
[im 65/389  soft-tissue]
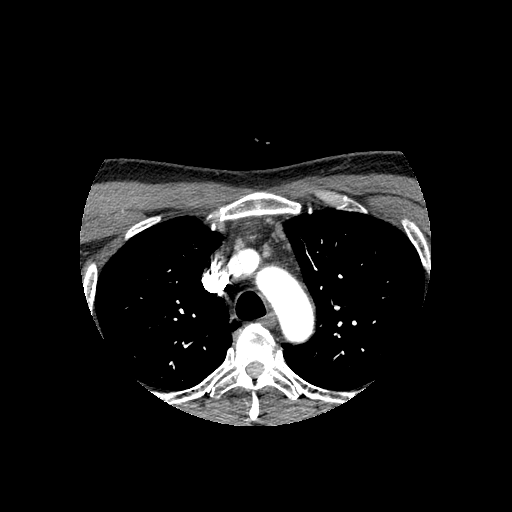
[im 130/389  bone]
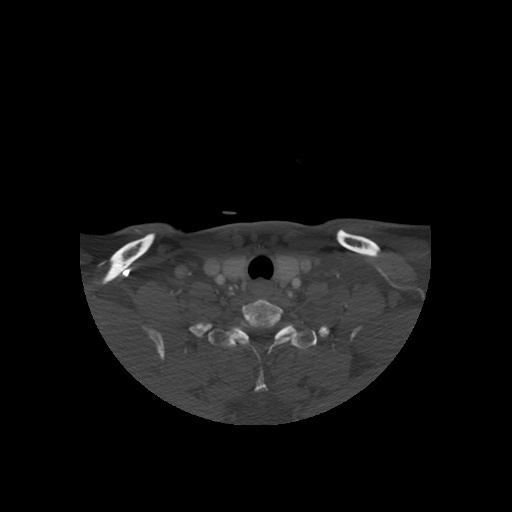
[im 195/389  soft-tissue]
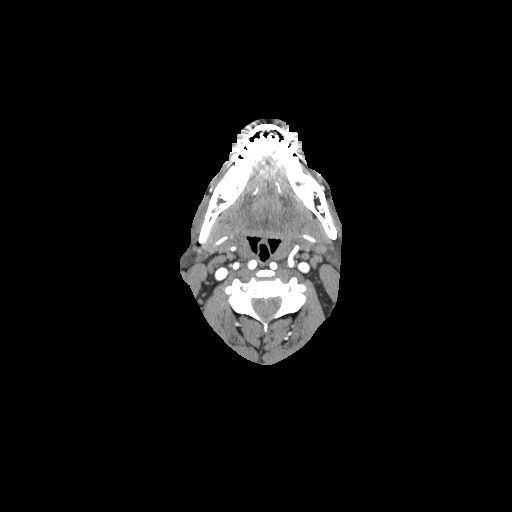
[im 259/389  bone]
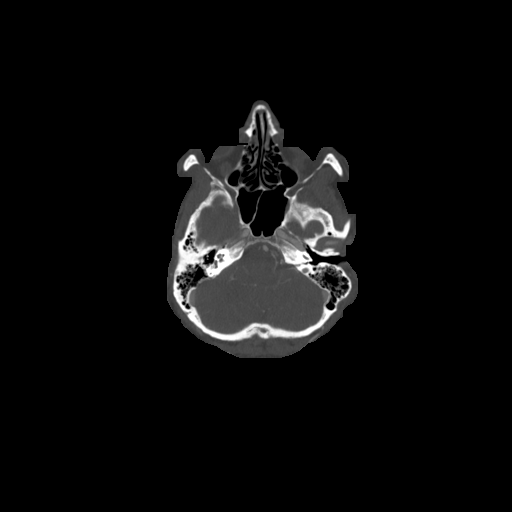
[im 324/389  soft-tissue]
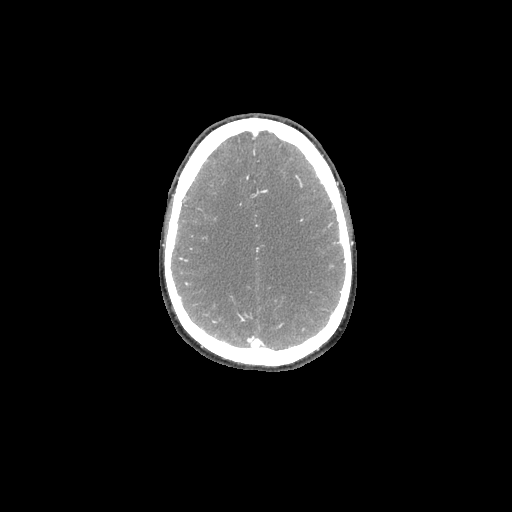

[5 of 33 positions shown; findings below may reference images not displayed]

FINDINGS: CTA HEAD FINDINGS

The ventricles and sulci are within normal limits for age. There is
no evidence of acute infarct, intracranial hemorrhage, mass, midline
shift, or extra-axial collection. There is no abnormal enhancement.
The orbits are unremarkable. The visualized paranasal sinuses and
mastoid air cells are clear.

Intracranial vertebral arteries are patent to the basilar. PICA,
AICA, and SCA origins appear patent. Basilar artery is patent
without stenosis. There is a fetal origin of the right PCA with no
right P1 segment visualized. There is a small patent left posterior
communicating artery. PCAs are otherwise unremarkable.

Internal carotid arteries are patent from skullbase to carotid
termini without stenosis. ACAs and MCAs are unremarkable. No
intracranial aneurysm is identified.

Review of the MIP images confirms the above findings.

CTA NECK FINDINGS

There is variant aortic arch branching. There is a common origin of
the common carotid arteries from the aortic arch arising just distal
to the right subclavian artery origin. The left vertebral artery
arises directly from the aortic arch, immediately proximal to the
left subclavian artery origin. There is prominent atherosclerotic
plaque in the right subclavian artery proximal to the vertebral
artery origin which is largely noncalcified and results in
approximately 60% stenosis. Left subclavian artery is unremarkable.

Common carotid arteries are patent without stenosis. There is mild
atherosclerotic plaque at the carotid bifurcations. Cervical
internal carotid arteries are patent without stenosis. There is
medialization of both internal carotid arteries proximally.
Vertebral arteries are patent without stenosis. Left vertebral
artery is mildly dominant.

Mild paraseptal emphysema is noted in the lung apices. There is
moderate cervical spondylosis, greatest at C5-6 and C6-7.
Right-sided neural foraminal stenosis appears severe at C5-6.

Review of the MIP images confirms the above findings.
IMPRESSION: 1. Prominent plaque in the right subclavian artery just proximal to
the vertebral artery origin resulting in approximately 60%
subclavian artery stenosis. This combined with abnormal right
vertebral artery waveform on ultrasound is consistent with
developing subclavian steal syndrome.
2. Mild cervical carotid atherosclerosis without stenosis.
3. Unremarkable head CTA.

## 2016-01-30 ENCOUNTER — Ambulatory Visit: Payer: Self-pay | Admitting: Internal Medicine

## 2016-02-01 ENCOUNTER — Encounter: Payer: Self-pay | Admitting: Internal Medicine

## 2016-05-31 ENCOUNTER — Other Ambulatory Visit: Payer: Self-pay | Admitting: Physician Assistant

## 2016-06-01 ENCOUNTER — Other Ambulatory Visit: Payer: Self-pay | Admitting: *Deleted

## 2016-06-01 DIAGNOSIS — I10 Essential (primary) hypertension: Secondary | ICD-10-CM

## 2016-06-01 DIAGNOSIS — E785 Hyperlipidemia, unspecified: Secondary | ICD-10-CM

## 2016-06-01 MED ORDER — LISINOPRIL 10 MG PO TABS: 10.0000 mg | ORAL_TABLET | Freq: Every day | ORAL | Status: DC

## 2016-06-01 NOTE — Telephone Encounter (Signed)
Review for refill, Thank you. 

## 2016-09-06 ENCOUNTER — Other Ambulatory Visit: Payer: Self-pay | Admitting: Physician Assistant

## 2016-09-06 ENCOUNTER — Other Ambulatory Visit: Payer: Self-pay | Admitting: *Deleted

## 2016-09-06 DIAGNOSIS — I10 Essential (primary) hypertension: Secondary | ICD-10-CM

## 2016-09-06 DIAGNOSIS — E785 Hyperlipidemia, unspecified: Secondary | ICD-10-CM

## 2016-09-06 MED ORDER — LISINOPRIL 10 MG PO TABS: 10.0000 mg | ORAL_TABLET | Freq: Every day | ORAL | 0 refills | Status: DC

## 2016-09-18 ENCOUNTER — Other Ambulatory Visit: Payer: Self-pay | Admitting: *Deleted

## 2016-09-18 DIAGNOSIS — E785 Hyperlipidemia, unspecified: Secondary | ICD-10-CM

## 2016-09-18 DIAGNOSIS — I10 Essential (primary) hypertension: Secondary | ICD-10-CM

## 2016-09-18 MED ORDER — LISINOPRIL 10 MG PO TABS: 10.0000 mg | ORAL_TABLET | Freq: Every day | ORAL | 1 refills | Status: DC

## 2016-10-29 ENCOUNTER — Encounter (HOSPITAL_COMMUNITY): Payer: Self-pay

## 2016-10-29 ENCOUNTER — Ambulatory Visit: Payer: Self-pay | Admitting: Surgery

## 2016-11-07 ENCOUNTER — Other Ambulatory Visit: Payer: Self-pay | Admitting: Physician Assistant

## 2016-11-07 DIAGNOSIS — E785 Hyperlipidemia, unspecified: Secondary | ICD-10-CM

## 2016-11-07 DIAGNOSIS — I10 Essential (primary) hypertension: Secondary | ICD-10-CM

## 2016-11-08 NOTE — Telephone Encounter (Signed)
Please review for refill.  

## 2016-11-14 ENCOUNTER — Telehealth: Payer: Self-pay | Admitting: Internal Medicine

## 2016-11-14 NOTE — Telephone Encounter (Signed)
New message      Pt c/o BP issue: STAT if pt c/o blurred vision, one-sided weakness or slurred speech  1. What are your last 5 BP readings? Yesterday at 2:03 pm 217/119, last night 131/73, this am 145/104  2. Are you having any other symptoms (ex. Dizziness, headache, blurred vision, passed out)? Dizziness sometimes 3. What is your BP issue? Pt did not take bp meds over weekend because he was out.  He now has his medication and bp is "spiking".  Please advise

## 2016-11-14 NOTE — Telephone Encounter (Addendum)
Spoke with patient and let him know that he needs to work on compliance with his medications.  He says all he was calling for was an appointment.  I have scheduled him for 12/12/16 at 3:45.  He was appreciative

## 2016-11-14 NOTE — Telephone Encounter (Signed)
Left message for patient that this is expected with running out of medications.  Now that he is back on medications it may take a few days for it to level out as the medications get back in his system

## 2016-12-05 ENCOUNTER — Encounter: Payer: Self-pay | Admitting: Internal Medicine

## 2016-12-05 ENCOUNTER — Ambulatory Visit (INDEPENDENT_AMBULATORY_CARE_PROVIDER_SITE_OTHER): Payer: Self-pay | Admitting: Internal Medicine

## 2016-12-05 VITALS — BP 140/80 | HR 65 | Ht 68.0 in | Wt 180.0 lb

## 2016-12-05 DIAGNOSIS — E785 Hyperlipidemia, unspecified: Secondary | ICD-10-CM

## 2016-12-05 DIAGNOSIS — R0789 Other chest pain: Secondary | ICD-10-CM

## 2016-12-05 DIAGNOSIS — I771 Stricture of artery: Secondary | ICD-10-CM

## 2016-12-05 DIAGNOSIS — I480 Paroxysmal atrial fibrillation: Secondary | ICD-10-CM

## 2016-12-05 DIAGNOSIS — I10 Essential (primary) hypertension: Secondary | ICD-10-CM

## 2016-12-05 DIAGNOSIS — R079 Chest pain, unspecified: Secondary | ICD-10-CM

## 2016-12-05 MED ORDER — SIMVASTATIN 10 MG PO TABS: 10.0000 mg | ORAL_TABLET | Freq: Every day | ORAL | 3 refills | Status: DC

## 2016-12-05 MED ORDER — METOPROLOL TARTRATE 25 MG PO TABS: 25.0000 mg | ORAL_TABLET | Freq: Two times a day (BID) | ORAL | 3 refills | Status: DC

## 2016-12-05 MED ORDER — NITROGLYCERIN 0.4 MG SL SUBL: 0.4000 mg | SUBLINGUAL_TABLET | SUBLINGUAL | 3 refills | Status: DC | PRN

## 2016-12-05 MED ORDER — LISINOPRIL 10 MG PO TABS: 10.0000 mg | ORAL_TABLET | Freq: Every day | ORAL | 3 refills | Status: DC

## 2016-12-05 NOTE — Patient Instructions (Addendum)
Medication Instructions:  Your physician has recommended you make the following change in your medication:  1) NTG---0.4 mg take as directed for chest pain    Labwork: Your physician recommends that you have lab work: CBC, BMET, INR, Lipid, Hepatic Function   Testing/Procedures: Your physician has requested that you have a cardiac catheterization. Cardiac catheterization is used to diagnose and/or treat various heart conditions. Doctors may recommend this procedure for a number of different reasons. The most common reason is to evaluate chest pain. Chest pain can be a symptom of coronary artery disease (CAD), and cardiac catheterization can show whether plaque is narrowing or blocking your heart's arteries. This procedure is also used to evaluate the valves, as well as measure the blood flow and oxygen levels in different parts of your heart. For further information please visit [email or web address]. Please follow instruction sheet, as given.--12/10/16 with Dr End  [facility]30[facility] Do not eat or drink after midnight the night prior to you procedure Okay to take medications with a small sip of water the morning of the procedure if needed Plan for one night stay at hospital Will need someone to drive you home at discharge    Follow-Up: Your physician recommends that you schedule a follow-up appointment 2 weeks after heart catheterization with Dr. Okey DupreEnd

## 2016-12-05 NOTE — Progress Notes (Signed)
Electrophysiology Office Note Date: 12/06/2016  ID:  [name], DOB 08/18/1956, MRN [id number]  PCP: Lorenda Ishihara, MD Electrophysiologist: Connery Shiffler  CC: atrial fibrillation follow up and evaluation for chest pain  [name] is a 60 y.o. male seen today for AF follow up and evaluation of chest pain.  He has been followed by Azalee Course, PA most recently.  He ran out of his blood pressure medications around New Year's Eve and was seen in the ER for hypertensive urgency. He has since been taking his medications but continues to have labile blood pressures. He also has intermittent chest pain that occurs at rest as well as exertion.  He has radiation of his pain to both shoulders but left greater than right. His pain occurs primarily at night. He does not have other GERD symptoms.  He also has daily palpitations that last for minutes at a time.  He has a strong family history of atrial fibrillation with his father dying of MI at age 69 and both grandfathers with CAD   He denies dyspnea, PND, orthopnea, nausea, vomiting, dizziness, syncope, edema, weight gain, or early satiety.  Past Medical History:  Diagnosis Date  . Carotid artery occlusion    Right Carotid Bruit  . Chest pain 2003   neg evaluation  . HTN (hypertension)   . Hyperlipemia   . Leg pain    a. ABIs (12/10):  Normal  . Paroxysmal atrial fibrillation (HCC)   . Subclavian steal syndrome Aug. 2015   Occult Right   Past Surgical History:  Procedure Laterality Date  . COLONOSCOPY  10/2007   negative results  . COLONOSCOPY W/ POLYPECTOMY  2003  . CYSTECTOMY     removed from back  . epidural steroids     in the back x3  . JOINT REPLACEMENT Right February 29, 1976   KNEE  . REPAIR PATELLAR TENDON     right  . ROTATOR CUFF REPAIR      Current Outpatient Prescriptions  Medication Sig Dispense Refill  . lisinopril (PRINIVIL,ZESTRIL) 10 MG tablet Take 1 tablet (10 mg total) by mouth daily. 90 tablet 3  .  metoprolol tartrate (LOPRESSOR) 25 MG tablet Take 1 tablet (25 mg total) by mouth 2 (two) times daily. 180 tablet 3  . simvastatin (ZOCOR) 10 MG tablet Take 1 tablet (10 mg total) by mouth daily. 90 tablet 3  . aspirin EC 325 MG tablet Take 325 mg by mouth daily.    [name] aspirin-acetaminophen-caffeine (EXCEDRIN MIGRAINE) 250-250-65 MG tablet Take 1-3 tablets by mouth daily as needed for headache.    . Cholecalciferol (VITAMIN D3) 1000 units CAPS Take 1,000 Units by mouth daily.    . nitroGLYCERIN (NITROSTAT) 0.4 MG SL tablet Place 1 tablet (0.4 mg total) under the tongue every 5 (five) minutes as needed for chest pain. 25 tablet 3  . ranitidine (ZANTAC) 150 MG tablet Take 150 mg by mouth daily as needed for heartburn.     No current facility-administered medications for this visit.     Allergies:   Tetanus toxoid   Social History: Social History   Social History  . Marital status: Divorced    Spouse name: N/A  . Number of children: N/A  . Years of education: N/A   Occupational History  . 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    [employer]   Social History Main Topics  . Smoking status: Current Every Day Smoker    Packs/day: 0.50  Years: 20.00    Types: Cigarettes  . Smokeless tobacco: Never Used  . Alcohol use Yes  . Drug use: No  . Sexual activity: Not on file   Other Topics Concern  . Not on file   Social History Narrative  . No narrative on file    Family History: Family History  Problem Relation Age of Onset  . Heart disease Father     After age 85  . Heart attack Father   . Cancer Brother   . Stroke Brother     Review of Systems: All other systems reviewed and are otherwise negative except as noted above.   Physical Exam: VS:  BP 140/80   Pulse 65   Ht 5\' 8"  (1.727 m)   Wt 180 lb (81.6 kg)   SpO2 98%   BMI 27.37 kg/m  , BMI Body mass index is 27.37 kg/m. Wt Readings from Last 3 Encounters:  12/05/16 180 lb (81.6 kg)  11/01/15 183 lb 6.4 oz (83.2 kg)  10/24/15  182 lb (82.6 kg)    GEN- The patient is well appearing, alert and oriented x 3 today.   HEENT: normocephalic, atraumatic; sclera clear, conjunctiva pink; hearing intact; oropharynx clear; neck supple  Lungs- Clear to ausculation bilaterally, normal work of breathing.  No wheezes, rales, rhonchi Heart- Regular rate and rhythm  GI- soft, non-tender, non-distended, bowel sounds present  Extremities- no clubbing, cyanosis, or edema  MS- no significant deformity or atrophy Skin- warm and dry, no rash or lesion  Psych- euthymic mood, full affect Neuro- strength and sensation are intact   EKG:  EKG is ordered today. The ekg ordered today shows sinus rhythm without acute ST changes  Recent Labs: 12/05/2016: ALT 13; BUN 13; Creatinine, Ser 0.86; Platelets 258; Potassium 4.4; Sodium 143    Other studies Reviewed: Additional studies/ records that were reviewed today include: Hao Meng's office notes   Assessment and Plan: 1.  Paroxysmal atrial fibrillation The patient continues to have daily palpitations but nothing sustained CHADS2VASC is 1 Continue ASA for now  2.  Atypical chest pain The patient has persistent chest pain with atypical and typical features. He has undergone previous work up with echo, ETT, and myoview. With family history, ongoing tobacco abuse, hypertension, and hyperlipidemia, I think that catheterization would be best at this point to definitely assess for CAD Risks, benefits reviewed with the patient today who wishes to proceed SL NTG Rx given ER precautions reviewed   3.  Hypertension Improved now back on medications He continues to have labile blood pressures Continue current therapy for now  4.  Hyperlipidemia LFT's, lipids today Continue statin  5.  Tobacco abuse He his down to 1/4 pack per day Complete cessation advised   EP issues are stable. Will refer to Dr End to follow for general cardiology going forward. See EP as needed.    Current medicines  are reviewed at length with the patient today.   The patient does not have concerns regarding his medicines.  The following changes were made today:  SL NTG prn  Labs/ tests ordered today include: lipids, LFT's, BMET, CBC, INR Orders Placed This Encounter  Procedures  . Basic metabolic panel  . CBC w/Diff  . INR/PT  . Hepatic function panel  . Lipid panel  . EKG 12-Lead     Disposition:   Follow up with Dr End after cath   Signed, Hillis Range, MD   [facility] [address] [place] [address] [phone] (office) [phone] (fax

## 2016-12-06 LAB — CBC WITH DIFFERENTIAL/PLATELET
Basophils Absolute: 0.1 10*3/uL (ref 0.0–0.2)
Basos: 0 %
EOS (ABSOLUTE): 0.3 10*3/uL (ref 0.0–0.4)
Eos: 2 %
HEMOGLOBIN: 15 g/dL (ref 13.0–17.7)
Hematocrit: 43.6 % (ref 37.5–51.0)
IMMATURE GRANS (ABS): 0 10*3/uL (ref 0.0–0.1)
IMMATURE GRANULOCYTES: 0 %
LYMPHS: 30 %
Lymphocytes Absolute: 3.7 10*3/uL — ABNORMAL HIGH (ref 0.7–3.1)
MCH: 29.6 pg (ref 26.6–33.0)
MCHC: 34.4 g/dL (ref 31.5–35.7)
MCV: 86 fL (ref 79–97)
MONOCYTES: 7 %
Monocytes Absolute: 0.9 10*3/uL (ref 0.1–0.9)
Neutrophils Absolute: 7.6 10*3/uL — ABNORMAL HIGH (ref 1.4–7.0)
Neutrophils: 61 %
Platelets: 258 10*3/uL (ref 150–379)
RBC: 5.07 x10E6/uL (ref 4.14–5.80)
RDW: 13.7 % (ref 12.3–15.4)
WBC: 12.5 10*3/uL — ABNORMAL HIGH (ref 3.4–10.8)

## 2016-12-06 LAB — BASIC METABOLIC PANEL
BUN/Creatinine Ratio: 15 (ref 9–20)
BUN: 13 mg/dL (ref 6–24)
CALCIUM: 9.4 mg/dL (ref 8.7–10.2)
CO2: 22 mmol/L (ref 18–29)
Chloride: 106 mmol/L (ref 96–106)
Creatinine, Ser: 0.86 mg/dL (ref 0.76–1.27)
GFR calc Af Amer: 110 mL/min/{1.73_m2} (ref >59)
GFR calc non Af Amer: 95 mL/min/{1.73_m2} (ref >59)
Glucose: 101 mg/dL — ABNORMAL HIGH (ref 65–99)
Potassium: 4.4 mmol/L (ref 3.5–5.2)
Sodium: 143 mmol/L (ref 134–144)

## 2016-12-06 LAB — HEPATIC FUNCTION PANEL
ALBUMIN: 4.3 g/dL (ref 3.5–5.5)
ALK PHOS: 59 IU/L (ref 39–117)
ALT: 13 IU/L (ref 0–44)
AST: 19 IU/L (ref 0–40)
Bilirubin Total: 0.5 mg/dL (ref 0.0–1.2)
Bilirubin, Direct: 0.11 mg/dL (ref 0.00–0.40)
TOTAL PROTEIN: 6.4 g/dL (ref 6.0–8.5)

## 2016-12-06 LAB — LIPID PANEL
CHOL/HDL RATIO: 3.8 ratio (ref 0.0–5.0)
Cholesterol, Total: 154 mg/dL (ref 100–199)
HDL: 41 mg/dL (ref >39)
LDL CALC: 61 mg/dL (ref 0–99)
Triglycerides: 259 mg/dL — ABNORMAL HIGH (ref 0–149)
VLDL Cholesterol Cal: 52 mg/dL — ABNORMAL HIGH (ref 5–40)

## 2016-12-06 LAB — PROTIME-INR
INR: 1 (ref 0.8–1.2)
Prothrombin Time: 10.4 s (ref 9.1–12.0)

## 2016-12-09 ENCOUNTER — Emergency Department
Admission: EM | Admit: 2016-12-09 | Discharge: 2016-12-09 | Disposition: A | Discharge status: Home / Self Care | Payer: Self-pay | Source: Home / Self Care | Attending: Family Medicine | Admitting: Family Medicine

## 2016-12-09 DIAGNOSIS — R6889 Other general symptoms and signs: Secondary | ICD-10-CM

## 2016-12-09 LAB — POCT INFLUENZA A/B
Influenza A, POC: NEGATIVE
Influenza B, POC: NEGATIVE

## 2016-12-09 MED ORDER — OSELTAMIVIR PHOSPHATE 75 MG PO CAPS: 75.0000 mg | ORAL_CAPSULE | Freq: Two times a day (BID) | ORAL | 0 refills | Status: DC

## 2016-12-09 NOTE — ED Provider Notes (Signed)
CSN: [id number]     Arrival date & time 12/09/16  1705 History   First MD Initiated Contact with Patient 12/09/16 1741     Chief Complaint  Patient presents with  . Generalized Body Aches   (Consider location/radiation/quality/duration/timing/severity/associated sxs/prior Treatment) HPI [name] is a 61 y.o. male presenting to UC with c/o 2 days of body aches, fatigue, chills, and mild productive cough. Pt notes he is scheduled to have [facility] tomorrow and was advised to get there at 5:30AM.  He wants to make sure he can still go tomorrow. He did not get the flu vaccine and notes someone was sick at work recently but is unsure if they had the flu. Denies n/v but has had loose stools. Denies SOB. Mild chest pain but notes that is chronic and it is why he is having the heart cath tomorrow.    Past Medical History:  Diagnosis Date  . Carotid artery occlusion    Right Carotid Bruit  . Chest pain 2003   neg evaluation  . HTN (hypertension)   . Hyperlipemia   . Leg pain    a. ABIs (12/10):  Normal  . Paroxysmal atrial fibrillation (HCC)   . Subclavian steal syndrome Aug. 2015   Occult Right   Past Surgical History:  Procedure Laterality Date  . COLONOSCOPY  10/2007   negative results  . COLONOSCOPY W/ POLYPECTOMY  2003  . CYSTECTOMY     removed from back  . epidural steroids     in the back x3  . JOINT REPLACEMENT Right February 29, 1976   KNEE  . REPAIR PATELLAR TENDON     right  . ROTATOR CUFF REPAIR     Family History  Problem Relation Age of Onset  . Heart disease Father     After age 60  . Heart attack Father   . Cancer Brother   . Stroke Brother    Social History  Substance Use Topics  . Smoking status: Current Every Day Smoker    Packs/day: 0.50    Years: 20.00    Types: Cigarettes  . Smokeless tobacco: Never Used  . Alcohol use Yes    Review of Systems  Constitutional: Positive for chills, fatigue and fever.  HENT:  Positive for congestion. Negative for ear pain, sore throat, trouble swallowing and voice change.   Respiratory: Positive for cough. Negative for shortness of breath.   Cardiovascular: Positive for chest pain ( "chronic"). Negative for palpitations and leg swelling.  Gastrointestinal: Positive for diarrhea (loose stools). Negative for abdominal pain, nausea and vomiting.  Musculoskeletal: Positive for arthralgias and myalgias. Negative for back pain.       Body aches  Skin: Negative for rash.  Neurological: Negative for dizziness, light-headedness and headaches.    Allergies  Tetanus toxoid  Home Medications   Prior to Admission medications   Medication Sig Start Date End Date Taking? Authorizing Provider  aspirin EC 325 MG tablet Take 325 mg by mouth daily.   Yes Historical Provider, MD  aspirin-acetaminophen-caffeine (EXCEDRIN MIGRAINE) [id number] MG tablet Take 1-3 tablets by mouth daily as needed for headache.   Yes Historical Provider, MD  Cholecalciferol (VITAMIN D3) 1000 units CAPS Take 1,000 Units by mouth daily.   Yes Historical Provider, MD  lisinopril (PRINIVIL,ZESTRIL) 10 MG tablet Take 1 tablet (10 mg total) by mouth daily. 12/05/16  Yes Hillis RangeJames Allred, MD  metoprolol tartrate (LOPRESSOR) 25 MG tablet Take 1 tablet (25  mg total) by mouth 2 (two) times daily. 12/05/16  Yes Hillis Range, MD  nitroGLYCERIN (NITROSTAT) 0.4 MG SL tablet Place 1 tablet (0.4 mg total) under the tongue every 5 (five) minutes as needed for chest pain. 12/05/16 03/05/17 Yes Hillis Range, MD  ranitidine (ZANTAC) 150 MG tablet Take 150 mg by mouth daily as needed for heartburn.   Yes Historical Provider, MD  simvastatin (ZOCOR) 10 MG tablet Take 1 tablet (10 mg total) by mouth daily. 12/05/16  Yes Hillis Range, MD  oseltamivir (TAMIFLU) 75 MG capsule Take 1 capsule (75 mg total) by mouth every 12 (twelve) hours. 12/09/16   Junius Finner, PA-C   Meds Ordered and Administered this Visit  Medications - No data to  display  Pulse 67   Temp 98.7 F (37.1 C) (Oral)   Ht 5\' 8"  (1.727 m)   Wt 175 lb (79.4 kg)   SpO2 98%   BMI 26.61 kg/m  No data found.   Physical Exam  Constitutional: He is oriented to person, place, and time. He appears well-developed and well-nourished. No distress.  HENT:  Head: Normocephalic and atraumatic.  Right Ear: Tympanic membrane normal.  Left Ear: Tympanic membrane normal.  Nose: Nose normal.  Mouth/Throat: Uvula is midline, oropharynx is clear and moist and mucous membranes are normal.  Eyes: EOM are normal.  Neck: Normal range of motion. Neck supple.  Cardiovascular: Normal rate and regular rhythm.   Pulmonary/Chest: Effort normal and breath sounds normal. No stridor. No respiratory distress. He has no wheezes. He has no rales. He exhibits no tenderness.  Musculoskeletal: Normal range of motion.  Lymphadenopathy:    He has no cervical adenopathy.  Neurological: He is alert and oriented to person, place, and time.  Skin: Skin is warm and dry. He is not diaphoretic.  Psychiatric: He has a normal mood and affect. His behavior is normal.  Nursing note and vitals reviewed.   Urgent Care Course     Procedures (including critical care time)  Labs Review Labs Reviewed  POCT INFLUENZA A/B - Normal    Imaging Review No results found.    MDM   1. Flu-like symptoms    Pt presenting with 2 days of flu-like symptoms. Rapid flu: Negative Discussed potential for false negative results. Due to his hx of CAD, offered to start pt on Tamiflu. Printed prescription for pt to take if he chooses. Discusses risks (GI upset) benefits (shortened duration and lessen severity of symptoms) if he truly has the flu. Encouraged to f/u tomorrow as previously scheduled, however, also encouraged to discussed this evenings visit with cath team tomorrow.  Patient verbalized understanding and agreement with treatment plan.     Junius Finner, PA-C 12/09/16 1755

## 2016-12-09 NOTE — ED Triage Notes (Signed)
Patient states that on Friday he started to feel bad and got the chills, body aches and very fatigued. Has used vicks vapor rub.

## 2016-12-10 ENCOUNTER — Encounter (HOSPITAL_COMMUNITY)
Admission: RE | Disposition: A | Discharge status: Home / Self Care | Payer: Self-pay | Source: Ambulatory Visit | Attending: Internal Medicine

## 2016-12-10 ENCOUNTER — Ambulatory Visit (HOSPITAL_COMMUNITY)
Admission: RE | Admit: 2016-12-10 | Discharge: 2016-12-11 | Disposition: A | Discharge status: Home / Self Care | Payer: Self-pay | Source: Ambulatory Visit | Attending: Internal Medicine | Admitting: Internal Medicine

## 2016-12-10 ENCOUNTER — Encounter (HOSPITAL_COMMUNITY): Payer: Self-pay | Admitting: General Practice

## 2016-12-10 DIAGNOSIS — I6529 Occlusion and stenosis of unspecified carotid artery: Secondary | ICD-10-CM | POA: Insufficient documentation

## 2016-12-10 DIAGNOSIS — R079 Chest pain, unspecified: Secondary | ICD-10-CM | POA: Diagnosis present

## 2016-12-10 DIAGNOSIS — F17210 Nicotine dependence, cigarettes, uncomplicated: Secondary | ICD-10-CM | POA: Insufficient documentation

## 2016-12-10 DIAGNOSIS — I25110 Atherosclerotic heart disease of native coronary artery with unstable angina pectoris: Secondary | ICD-10-CM | POA: Insufficient documentation

## 2016-12-10 DIAGNOSIS — E785 Hyperlipidemia, unspecified: Secondary | ICD-10-CM | POA: Insufficient documentation

## 2016-12-10 DIAGNOSIS — Z7982 Long term (current) use of aspirin: Secondary | ICD-10-CM | POA: Insufficient documentation

## 2016-12-10 DIAGNOSIS — I208 Other forms of angina pectoris: Secondary | ICD-10-CM | POA: Diagnosis present

## 2016-12-10 DIAGNOSIS — I10 Essential (primary) hypertension: Secondary | ICD-10-CM | POA: Insufficient documentation

## 2016-12-10 DIAGNOSIS — Z96651 Presence of right artificial knee joint: Secondary | ICD-10-CM | POA: Insufficient documentation

## 2016-12-10 DIAGNOSIS — I2582 Chronic total occlusion of coronary artery: Secondary | ICD-10-CM | POA: Insufficient documentation

## 2016-12-10 DIAGNOSIS — I480 Paroxysmal atrial fibrillation: Secondary | ICD-10-CM | POA: Insufficient documentation

## 2016-12-10 DIAGNOSIS — Z823 Family history of stroke: Secondary | ICD-10-CM | POA: Insufficient documentation

## 2016-12-10 DIAGNOSIS — I200 Unstable angina: Secondary | ICD-10-CM

## 2016-12-10 DIAGNOSIS — Z8249 Family history of ischemic heart disease and other diseases of the circulatory system: Secondary | ICD-10-CM | POA: Insufficient documentation

## 2016-12-10 DIAGNOSIS — I708 Atherosclerosis of other arteries: Secondary | ICD-10-CM | POA: Insufficient documentation

## 2016-12-10 DIAGNOSIS — Z955 Presence of coronary angioplasty implant and graft: Secondary | ICD-10-CM

## 2016-12-10 HISTORY — DX: Atherosclerotic heart disease of native coronary artery without angina pectoris: I25.10

## 2016-12-10 HISTORY — PX: CARDIAC CATHETERIZATION: SHX172

## 2016-12-10 HISTORY — DX: Personal history of urinary calculi: Z87.442

## 2016-12-10 HISTORY — PX: CORONARY ANGIOPLASTY WITH STENT PLACEMENT: SHX49

## 2016-12-10 LAB — CREATININE, SERUM
Creatinine, Ser: 0.8 mg/dL (ref 0.61–1.24)
GFR calc non Af Amer: >60 mL/min (ref >60)

## 2016-12-10 LAB — CBC
HEMATOCRIT: 39 % (ref 39.0–52.0)
Hemoglobin: 13.5 g/dL (ref 13.0–17.0)
MCH: 29.8 pg (ref 26.0–34.0)
MCHC: 34.6 g/dL (ref 30.0–36.0)
MCV: 86.1 fL (ref 78.0–100.0)
Platelets: 179 10*3/uL (ref 150–400)
RBC: 4.53 MIL/uL (ref 4.22–5.81)
RDW: 13.6 % (ref 11.5–15.5)
WBC: 7.3 10*3/uL (ref 4.0–10.5)

## 2016-12-10 LAB — POCT ACTIVATED CLOTTING TIME
ACTIVATED CLOTTING TIME: 268 s
ACTIVATED CLOTTING TIME: 169 s
Activated Clotting Time: 252 seconds
Activated Clotting Time: 389 seconds

## 2016-12-10 SURGERY — LEFT HEART CATH AND CORONARY ANGIOGRAPHY

## 2016-12-10 MED ORDER — NITROGLYCERIN 1 MG/10 ML FOR IR/CATH LAB
INTRA_ARTERIAL | Status: AC
  Filled 2016-12-10: qty 10

## 2016-12-10 MED ORDER — METOPROLOL TARTRATE 25 MG PO TABS
25.0000 mg | ORAL_TABLET | Freq: Two times a day (BID) | ORAL | Status: DC
  Administered 2016-12-10 – 2016-12-11 (×2): 25 mg via ORAL
  Filled 2016-12-10 (×2): qty 1

## 2016-12-10 MED ORDER — NITROGLYCERIN 1 MG/10 ML FOR IR/CATH LAB
INTRA_ARTERIAL | Status: DC | PRN
  Administered 2016-12-10 (×2): 200 ug via INTRACORONARY

## 2016-12-10 MED ORDER — IOPAMIDOL (ISOVUE-370) INJECTION 76%
INTRAVENOUS | Status: AC
  Filled 2016-12-10: qty 50

## 2016-12-10 MED ORDER — TIROFIBAN (AGGRASTAT) BOLUS VIA INFUSION
INTRAVENOUS | Status: DC | PRN
  Administered 2016-12-10: 1927.5 ug via INTRAVENOUS

## 2016-12-10 MED ORDER — ASPIRIN 81 MG PO CHEW
CHEWABLE_TABLET | ORAL | Status: AC
  Filled 2016-12-10: qty 1

## 2016-12-10 MED ORDER — SODIUM CHLORIDE 0.9 % IV SOLN: INTRAVENOUS | Status: DC

## 2016-12-10 MED ORDER — ATORVASTATIN CALCIUM 40 MG PO TABS
40.0000 mg | ORAL_TABLET | Freq: Every day | ORAL | Status: DC
Start: 2016-12-10 — End: 2016-12-11
  Administered 2016-12-10: 40 mg via ORAL
  Filled 2016-12-10: qty 1

## 2016-12-10 MED ORDER — SODIUM CHLORIDE 0.9% FLUSH
3.0000 mL | Freq: Two times a day (BID) | INTRAVENOUS | Status: DC
  Administered 2016-12-10: 3 mL via INTRAVENOUS

## 2016-12-10 MED ORDER — ENOXAPARIN SODIUM 40 MG/0.4ML ~~LOC~~ SOLN
40.0000 mg | SUBCUTANEOUS | Status: DC
  Administered 2016-12-11: 40 mg via SUBCUTANEOUS
  Filled 2016-12-10: qty 0.4

## 2016-12-10 MED ORDER — MORPHINE SULFATE (PF) 2 MG/ML IV SOLN
INTRAVENOUS | Status: DC | PRN
  Administered 2016-12-10: 2 mg via INTRAVENOUS

## 2016-12-10 MED ORDER — SODIUM CHLORIDE 0.9 % WEIGHT BASED INFUSION: 1.0000 mL/kg/h | INTRAVENOUS | Status: DC

## 2016-12-10 MED ORDER — IOPAMIDOL (ISOVUE-370) INJECTION 76%
INTRAVENOUS | Status: AC
  Filled 2016-12-10: qty 100

## 2016-12-10 MED ORDER — LIDOCAINE HCL (PF) 1 % IJ SOLN
INTRAMUSCULAR | Status: DC | PRN
  Administered 2016-12-10: 8 mL via INTRADERMAL

## 2016-12-10 MED ORDER — MORPHINE SULFATE (PF) 10 MG/ML IV SOLN
INTRAVENOUS | Status: AC
  Filled 2016-12-10: qty 1

## 2016-12-10 MED ORDER — MIDAZOLAM HCL 2 MG/2ML IJ SOLN
INTRAMUSCULAR | Status: DC | PRN
  Administered 2016-12-10 (×2): 1 mg via INTRAVENOUS

## 2016-12-10 MED ORDER — IOPAMIDOL (ISOVUE-370) INJECTION 76%
INTRAVENOUS | Status: DC | PRN
  Administered 2016-12-10: 170 mL via INTRA_ARTERIAL

## 2016-12-10 MED ORDER — NITROGLYCERIN IN D5W 200-5 MCG/ML-% IV SOLN
INTRAVENOUS | Status: AC
  Filled 2016-12-10: qty 250

## 2016-12-10 MED ORDER — SODIUM CHLORIDE 0.9 % IV SOLN: 250.0000 mL | INTRAVENOUS | Status: DC | PRN

## 2016-12-10 MED ORDER — HEPARIN (PORCINE) IN NACL 2-0.9 UNIT/ML-% IJ SOLN
INTRAMUSCULAR | Status: DC | PRN
Start: 2016-12-10 — End: 2016-12-10
  Administered 2016-12-10: 1000 mL

## 2016-12-10 MED ORDER — SODIUM CHLORIDE 0.9% FLUSH: 3.0000 mL | Freq: Two times a day (BID) | INTRAVENOUS | Status: DC

## 2016-12-10 MED ORDER — ACETAMINOPHEN 325 MG PO TABS: 650.0000 mg | ORAL_TABLET | ORAL | Status: DC | PRN

## 2016-12-10 MED ORDER — MIDAZOLAM HCL 2 MG/2ML IJ SOLN
INTRAMUSCULAR | Status: AC
  Filled 2016-12-10: qty 2

## 2016-12-10 MED ORDER — FAMOTIDINE 20 MG PO TABS
20.0000 mg | ORAL_TABLET | Freq: Two times a day (BID) | ORAL | Status: DC
  Administered 2016-12-10 – 2016-12-11 (×3): 20 mg via ORAL
  Filled 2016-12-10 (×3): qty 1

## 2016-12-10 MED ORDER — SODIUM CHLORIDE 0.9% FLUSH: 3.0000 mL | INTRAVENOUS | Status: DC | PRN

## 2016-12-10 MED ORDER — ASPIRIN 81 MG PO CHEW
81.0000 mg | CHEWABLE_TABLET | ORAL | Status: AC
  Administered 2016-12-10: 81 mg via ORAL

## 2016-12-10 MED ORDER — SODIUM CHLORIDE 0.9 % WEIGHT BASED INFUSION
3.0000 mL/kg/h | INTRAVENOUS | Status: DC
  Administered 2016-12-10: 3 mL/kg/h via INTRAVENOUS

## 2016-12-10 MED ORDER — CLOPIDOGREL BISULFATE 300 MG PO TABS
ORAL_TABLET | ORAL | Status: DC | PRN
  Administered 2016-12-10: 600 mg via ORAL

## 2016-12-10 MED ORDER — ONDANSETRON HCL 4 MG/2ML IJ SOLN: 4.0000 mg | Freq: Four times a day (QID) | INTRAMUSCULAR | Status: DC | PRN

## 2016-12-10 MED ORDER — LIDOCAINE HCL (PF) 1 % IJ SOLN
INTRAMUSCULAR | Status: AC
  Filled 2016-12-10: qty 30

## 2016-12-10 MED ORDER — CLOPIDOGREL BISULFATE 75 MG PO TABS
75.0000 mg | ORAL_TABLET | Freq: Every day | ORAL | Status: DC
  Administered 2016-12-11: 75 mg via ORAL
  Filled 2016-12-10: qty 1

## 2016-12-10 MED ORDER — HEPARIN SODIUM (PORCINE) 1000 UNIT/ML IJ SOLN
INTRAMUSCULAR | Status: DC | PRN
  Administered 2016-12-10: 7000 [IU] via INTRAVENOUS
  Administered 2016-12-10: 2000 [IU] via INTRAVENOUS

## 2016-12-10 MED ORDER — TIROFIBAN HCL IN NACL 5-0.9 MG/100ML-% IV SOLN
0.1500 ug/kg/min | INTRAVENOUS | Status: DC
  Filled 2016-12-10: qty 100

## 2016-12-10 MED ORDER — FENTANYL CITRATE (PF) 100 MCG/2ML IJ SOLN
INTRAMUSCULAR | Status: AC
  Filled 2016-12-10: qty 2

## 2016-12-10 MED ORDER — MORPHINE SULFATE (PF) 2 MG/ML IV SOLN: 2.0000 mg | INTRAVENOUS | Status: DC | PRN

## 2016-12-10 MED ORDER — LABETALOL HCL 5 MG/ML IV SOLN: 10.0000 mg | INTRAVENOUS | Status: DC | PRN

## 2016-12-10 MED ORDER — LISINOPRIL 10 MG PO TABS
10.0000 mg | ORAL_TABLET | Freq: Every day | ORAL | Status: DC
  Administered 2016-12-11: 10 mg via ORAL
  Filled 2016-12-10: qty 1

## 2016-12-10 MED ORDER — ANGIOPLASTY BOOK
Freq: Once | Status: AC
  Administered 2016-12-10: 21:00:00
  Filled 2016-12-10: qty 1

## 2016-12-10 MED ORDER — NITROGLYCERIN 0.4 MG SL SUBL
SUBLINGUAL_TABLET | SUBLINGUAL | Status: AC
  Filled 2016-12-10: qty 1

## 2016-12-10 MED ORDER — HYDRALAZINE HCL 20 MG/ML IJ SOLN: 5.0000 mg | INTRAMUSCULAR | Status: DC | PRN

## 2016-12-10 MED ORDER — NITROGLYCERIN 0.3 MG SL SUBL
SUBLINGUAL_TABLET | SUBLINGUAL | Status: DC | PRN
  Administered 2016-12-10: 0.4 mg via SUBLINGUAL

## 2016-12-10 MED ORDER — VERAPAMIL HCL 2.5 MG/ML IV SOLN
INTRAVENOUS | Status: AC
  Filled 2016-12-10: qty 2

## 2016-12-10 MED ORDER — ASPIRIN 81 MG PO CHEW
81.0000 mg | CHEWABLE_TABLET | Freq: Every day | ORAL | Status: DC
  Administered 2016-12-11: 10:00:00 81 mg via ORAL
  Filled 2016-12-10: qty 1

## 2016-12-10 MED ORDER — FENTANYL CITRATE (PF) 100 MCG/2ML IJ SOLN
INTRAMUSCULAR | Status: DC | PRN
  Administered 2016-12-10 (×2): 50 ug via INTRAVENOUS

## 2016-12-10 MED ORDER — OXYCODONE-ACETAMINOPHEN 5-325 MG PO TABS: 1.0000 | ORAL_TABLET | ORAL | Status: DC | PRN

## 2016-12-10 MED ORDER — NITROGLYCERIN IN D5W 200-5 MCG/ML-% IV SOLN
INTRAVENOUS | Status: DC | PRN
  Administered 2016-12-10: 10 ug/min via INTRAVENOUS

## 2016-12-10 MED ORDER — NITROGLYCERIN 0.4 MG SL SUBL: 0.4000 mg | SUBLINGUAL_TABLET | SUBLINGUAL | Status: DC | PRN

## 2016-12-10 MED ORDER — CLOPIDOGREL BISULFATE 300 MG PO TABS
ORAL_TABLET | ORAL | Status: AC
  Filled 2016-12-10: qty 1

## 2016-12-10 MED ORDER — TIROFIBAN HCL IN NACL 5-0.9 MG/100ML-% IV SOLN
INTRAVENOUS | Status: DC | PRN
  Administered 2016-12-10: 0.15 ug/kg/min via INTRAVENOUS

## 2016-12-10 MED ORDER — TIROFIBAN HCL IN NACL 5-0.9 MG/100ML-% IV SOLN
0.1500 ug/kg/min | INTRAVENOUS | Status: DC
  Administered 2016-12-10: 0.15 ug/kg/min via INTRAVENOUS
  Filled 2016-12-10: qty 100

## 2016-12-10 MED ORDER — ATROPINE SULFATE 1 MG/10ML IJ SOSY
PREFILLED_SYRINGE | INTRAMUSCULAR | Status: AC
  Filled 2016-12-10: qty 10

## 2016-12-10 MED ORDER — HEPARIN SODIUM (PORCINE) 1000 UNIT/ML IJ SOLN
INTRAMUSCULAR | Status: AC
  Filled 2016-12-10: qty 1

## 2016-12-10 SURGICAL SUPPLY — 24 items
BALLN MOZEC 2.50X14 (BALLOONS) ×3
BALLN ~~LOC~~ EMERGE MR 4.0X20 (BALLOONS) ×3
BALLOON MOZEC 2.50X14 (BALLOONS) ×1 IMPLANT
BALLOON ~~LOC~~ EMERGE MR 4.0X20 (BALLOONS) ×1 IMPLANT
BALLOON ~~LOC~~ MOZEC 4.5X15 (BALLOONS) ×2 IMPLANT
CATH INFINITI 5FR MULTPACK ANG (CATHETERS) ×3 IMPLANT
CATH OPTICROSS 40MHZ (CATHETERS) ×3 IMPLANT
CATH VISTA GUIDE 6FR JR4 (CATHETERS) ×3 IMPLANT
GLIDESHEATH SLEND SS 6F .021 (SHEATH) IMPLANT
GUIDEWIRE INQWIRE 1.5J.035X260 (WIRE) IMPLANT
INQWIRE 1.5J .035X260CM (WIRE)
KIT ENCORE 26 ADVANTAGE (KITS) ×3 IMPLANT
KIT HEART LEFT (KITS) ×3 IMPLANT
PACK CARDIAC CATHETERIZATION (CUSTOM PROCEDURE TRAY) ×3 IMPLANT
SET INTRODUCER MICROPUNCT 5F (INTRODUCER) ×3 IMPLANT
SHEATH PINNACLE 5F 10CM (SHEATH) ×3 IMPLANT
SHEATH PINNACLE 6F 10CM (SHEATH) ×3 IMPLANT
SLED PULL BACK IVUS (MISCELLANEOUS) ×3 IMPLANT
STENT XIENCE ALPINE RX 3.5X28 (Permanent Stent) ×3 IMPLANT
SYR MEDRAD MARK V 150ML (SYRINGE) ×3 IMPLANT
TRANSDUCER W/STOPCOCK (MISCELLANEOUS) ×3 IMPLANT
TUBING CIL FLEX 10 FLL-RA (TUBING) ×3 IMPLANT
WIRE EMERALD 3MM-J .035X150CM (WIRE) ×3 IMPLANT
WIRE RUNTHROUGH .014X180CM (WIRE) ×3 IMPLANT

## 2016-12-10 NOTE — H&P (View-Only) (Signed)
Electrophysiology Office Note Date: 12/06/2016  ID:  [name], DOB 08/18/1956, MRN [id number]  PCP: Lorenda Ishihara, MD Electrophysiologist: Mahki Spikes  CC: atrial fibrillation follow up and evaluation for chest pain  [name] is a 61 y.o. male seen today for AF follow up and evaluation of chest pain.  He has been followed by Azalee Course, PA most recently.  He ran out of his blood pressure medications around New Year's Eve and was seen in the ER for hypertensive urgency. He has since been taking his medications but continues to have labile blood pressures. He also has intermittent chest pain that occurs at rest as well as exertion.  He has radiation of his pain to both shoulders but left greater than right. His pain occurs primarily at night. He does not have other GERD symptoms.  He also has daily palpitations that last for minutes at a time.  He has a strong family history of atrial fibrillation with his father dying of MI at age 69 and both grandfathers with CAD   He denies dyspnea, PND, orthopnea, nausea, vomiting, dizziness, syncope, edema, weight gain, or early satiety.  Past Medical History:  Diagnosis Date  . Carotid artery occlusion    Right Carotid Bruit  . Chest pain 2003   neg evaluation  . HTN (hypertension)   . Hyperlipemia   . Leg pain    a. ABIs (12/10):  Normal  . Paroxysmal atrial fibrillation (HCC)   . Subclavian steal syndrome Aug. 2015   Occult Right   Past Surgical History:  Procedure Laterality Date  . COLONOSCOPY  10/2007   negative results  . COLONOSCOPY W/ POLYPECTOMY  2003  . CYSTECTOMY     removed from back  . epidural steroids     in the back x3  . JOINT REPLACEMENT Right February 29, 1976   KNEE  . REPAIR PATELLAR TENDON     right  . ROTATOR CUFF REPAIR      Current Outpatient Prescriptions  Medication Sig Dispense Refill  . lisinopril (PRINIVIL,ZESTRIL) 10 MG tablet Take 1 tablet (10 mg total) by mouth daily. 90 tablet 3  .  metoprolol tartrate (LOPRESSOR) 25 MG tablet Take 1 tablet (25 mg total) by mouth 2 (two) times daily. 180 tablet 3  . simvastatin (ZOCOR) 10 MG tablet Take 1 tablet (10 mg total) by mouth daily. 90 tablet 3  . aspirin EC 325 MG tablet Take 325 mg by mouth daily.    [name] aspirin-acetaminophen-caffeine (EXCEDRIN MIGRAINE) 250-250-65 MG tablet Take 1-3 tablets by mouth daily as needed for headache.    . Cholecalciferol (VITAMIN D3) 1000 units CAPS Take 1,000 Units by mouth daily.    . nitroGLYCERIN (NITROSTAT) 0.4 MG SL tablet Place 1 tablet (0.4 mg total) under the tongue every 5 (five) minutes as needed for chest pain. 25 tablet 3  . ranitidine (ZANTAC) 150 MG tablet Take 150 mg by mouth daily as needed for heartburn.     No current facility-administered medications for this visit.     Allergies:   Tetanus toxoid   Social History: Social History   Social History  . Marital status: Divorced    Spouse name: N/A  . Number of children: N/A  . Years of education: N/A   Occupational History  . 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    [employer]   Social History Main Topics  . Smoking status: Current Every Day Smoker    Packs/day: 0.50  Years: 20.00    Types: Cigarettes  . Smokeless tobacco: Never Used  . Alcohol use Yes  . Drug use: No  . Sexual activity: Not on file   Other Topics Concern  . Not on file   Social History Narrative  . No narrative on file    Family History: Family History  Problem Relation Age of Onset  . Heart disease Father     After age 85  . Heart attack Father   . Cancer Brother   . Stroke Brother     Review of Systems: All other systems reviewed and are otherwise negative except as noted above.   Physical Exam: VS:  BP 140/80   Pulse 65   Ht 5\' 8"  (1.727 m)   Wt 180 lb (81.6 kg)   SpO2 98%   BMI 27.37 kg/m  , BMI Body mass index is 27.37 kg/m. Wt Readings from Last 3 Encounters:  12/05/16 180 lb (81.6 kg)  11/01/15 183 lb 6.4 oz (83.2 kg)  10/24/15  182 lb (82.6 kg)    GEN- The patient is well appearing, alert and oriented x 3 today.   HEENT: normocephalic, atraumatic; sclera clear, conjunctiva pink; hearing intact; oropharynx clear; neck supple  Lungs- Clear to ausculation bilaterally, normal work of breathing.  No wheezes, rales, rhonchi Heart- Regular rate and rhythm  GI- soft, non-tender, non-distended, bowel sounds present  Extremities- no clubbing, cyanosis, or edema  MS- no significant deformity or atrophy Skin- warm and dry, no rash or lesion  Psych- euthymic mood, full affect Neuro- strength and sensation are intact   EKG:  EKG is ordered today. The ekg ordered today shows sinus rhythm without acute ST changes  Recent Labs: 12/05/2016: ALT 13; BUN 13; Creatinine, Ser 0.86; Platelets 258; Potassium 4.4; Sodium 143    Other studies Reviewed: Additional studies/ records that were reviewed today include: Hao Meng's office notes   Assessment and Plan: 1.  Paroxysmal atrial fibrillation The patient continues to have daily palpitations but nothing sustained CHADS2VASC is 1 Continue ASA for now  2.  Atypical chest pain The patient has persistent chest pain with atypical and typical features. He has undergone previous work up with echo, ETT, and myoview. With family history, ongoing tobacco abuse, hypertension, and hyperlipidemia, I think that catheterization would be best at this point to definitely assess for CAD Risks, benefits reviewed with the patient today who wishes to proceed SL NTG Rx given ER precautions reviewed   3.  Hypertension Improved now back on medications He continues to have labile blood pressures Continue current therapy for now  4.  Hyperlipidemia LFT's, lipids today Continue statin  5.  Tobacco abuse He his down to 1/4 pack per day Complete cessation advised   EP issues are stable. Will refer to Dr End to follow for general cardiology going forward. See EP as needed.    Current medicines  are reviewed at length with the patient today.   The patient does not have concerns regarding his medicines.  The following changes were made today:  SL NTG prn  Labs/ tests ordered today include: lipids, LFT's, BMET, CBC, INR Orders Placed This Encounter  Procedures  . Basic metabolic panel  . CBC w/Diff  . INR/PT  . Hepatic function panel  . Lipid panel  . EKG 12-Lead     Disposition:   Follow up with Dr End after cath   Signed, Hillis Range, MD   [facility] [address] [place] [address] [phone] (office) [phone] (fax

## 2016-12-10 NOTE — Care Management Note (Addendum)
Case Management Note  Patient Details  Name: Hollie BeachRobert T Krohn MRN: [id number] Date of Birth: 07/29/1956  Subjective/Objective:  S/p coronary stent interventon, will be on plavix/asa,  Patient has no insurance , may need medication ast, NCM will cont to follow for dc needs.    1/30 0930 Letha Capeeborah Desmon Hitchner RN, BSN - NCM assisted patient with medications with Match letter, he states he would also like to go to [facility] for hospital follow up apt.  PTA indep, he has transportation at dc. He has a scheduled hospital follow up with [facility] 2/2 at 9:45.                  Action/Plan:   Expected Discharge Date:                  Expected Discharge Plan:  Home/Self Care  In-House Referral:     Discharge planning Services  CM Consult, Medication Assistance, MATCH Program  Post Acute Care Choice:    Choice offered to:     DME Arranged:    DME Agency:     HH Arranged:    HH Agency:     Status of Service:  In process, will continue to follow  If discussed at Long Length of Stay Meetings, dates discussed:    Additional Comments:  Leone Havenaylor, Jadee Golebiewski Clinton, RN 12/10/2016, 10:38 AM

## 2016-12-10 NOTE — Brief Op Note (Signed)
Brief Cardiac Catheterization Report (Full Report to Follow)  Date: 12/10/2016 Time: 9:32 AM  PATIENT:  [name]  61 y.o. male  PRE-OPERATIVE DIAGNOSIS:  Stable angina  POST-OPERATIVE DIAGNOSIS:  Same  PROCEDURE:  Procedure(s): Left Heart Cath and Coronary Angiography (N/A) Coronary Stent Intervention (N/A) Intravascular Ultrasound/IVUS (N/A)  SURGEON:  Surgeon(s) and Role:    * Yvonne Kendallhristopher Kanyon Seibold, MD - Primary  FINDINGS: 1. Relatively small, diffusely diseased left coronary artery with subtotal occlusion of apical LAD. 2. Large RCA with 95% midvessel stenosis and filling of the distal PDA via left to right collaterals. 3. Normal left ventricular filling pressure. 4. Normal left ventricular contraction. 5. Successful IVUS-guided PCI to mid RCA with placement of Xience Alpine 3.5 x 28 mm drug-eluting stent post-dilated with 4.5 mm [place] balloon.  Patient had pain following stent deployment, improving with NTG infusion and morphine.  RECOMMENDATIONS: 1. Admit for overnight observation. 2. DAPT with aspirin and clopidogrel for at least 6 months, ideally longer. 3. Continue tirofiban infusion for 6 hours. 4. Increase statin therapy to atorvastatin 80 mg daily. 5. Wean NTG infusion as tolerated.  Yvonne Kendallhristopher Kriste Broman, MD [facility] Pager: [phone]

## 2016-12-10 NOTE — Interval H&P Note (Signed)
History and Physical Interval Note:  12/10/2016 7:40 AM  [name]  has presented today for cardiac catheterization, with the diagnosis of chest pain. The various methods of treatment have been discussed with the patient and family. After consideration of risks, benefits and other options for treatment, the patient has consented to  Procedure(s): Left Heart Cath and Coronary Angiography (N/A) as a surgical intervention .  The patient's history has been reviewed, patient examined, no change in status, stable for surgery.  I have reviewed the patient's chart and labs.  Questions were answered to the patient's satisfaction.    Cath Lab Visit (complete for each Cath Lab visit)  Clinical Evaluation Leading to the Procedure:   ACS: No.  Non-ACS:    Anginal Classification: CCS IV  Anti-ischemic medical therapy: Minimal Therapy (1 class of medications)  Non-Invasive Test Results: No non-invasive testing performed  Prior CABG: No previous CABG   [name]

## 2016-12-10 NOTE — Progress Notes (Signed)
Site area: right groin  Site Prior to Removal:  Level 0  Pressure Applied For 20 MINUTES    Minutes Beginning at 1320  Manual:   Yes.    Patient Status During Pull:  AAO X 4  Post Pull Groin Site:  Level 0  Post Pull Instructions Given:  Yes.    Post Pull Pulses Present:  Yes.    Dressing Applied:  Yes.    Comments:  Tolerated procedure well

## 2016-12-11 ENCOUNTER — Encounter (HOSPITAL_COMMUNITY): Payer: Self-pay | Admitting: Internal Medicine

## 2016-12-11 ENCOUNTER — Telehealth: Payer: Self-pay | Admitting: Internal Medicine

## 2016-12-11 DIAGNOSIS — I208 Other forms of angina pectoris: Secondary | ICD-10-CM

## 2016-12-11 LAB — BASIC METABOLIC PANEL
Anion gap: 5 (ref 5–15)
BUN: 8 mg/dL (ref 6–20)
CHLORIDE: 112 mmol/L — AB (ref 101–111)
CO2: 20 mmol/L — AB (ref 22–32)
CREATININE: 0.7 mg/dL (ref 0.61–1.24)
Calcium: 8.1 mg/dL — ABNORMAL LOW (ref 8.9–10.3)
GFR calc Af Amer: >60 mL/min (ref >60)
GFR calc non Af Amer: >60 mL/min (ref >60)
GLUCOSE: 100 mg/dL — AB (ref 65–99)
Potassium: 3.6 mmol/L (ref 3.5–5.1)
SODIUM: 137 mmol/L (ref 135–145)

## 2016-12-11 LAB — CBC
HEMATOCRIT: 36 % — AB (ref 39.0–52.0)
Hemoglobin: 12.5 g/dL — ABNORMAL LOW (ref 13.0–17.0)
MCH: 29.6 pg (ref 26.0–34.0)
MCHC: 34.7 g/dL (ref 30.0–36.0)
MCV: 85.3 fL (ref 78.0–100.0)
PLATELETS: 161 10*3/uL (ref 150–400)
RBC: 4.22 MIL/uL (ref 4.22–5.81)
RDW: 13.4 % (ref 11.5–15.5)
WBC: 8.4 10*3/uL (ref 4.0–10.5)

## 2016-12-11 MED ORDER — CLOPIDOGREL BISULFATE 75 MG PO TABS: 75.0000 mg | ORAL_TABLET | Freq: Every day | ORAL | 11 refills | Status: DC

## 2016-12-11 MED ORDER — ATORVASTATIN CALCIUM 40 MG PO TABS: 40.0000 mg | ORAL_TABLET | Freq: Every day | ORAL | 6 refills | Status: DC

## 2016-12-11 MED ORDER — ASPIRIN 81 MG PO CHEW: 81.0000 mg | CHEWABLE_TABLET | Freq: Every day | ORAL | Status: DC

## 2016-12-11 NOTE — Progress Notes (Signed)
Progress Note  Patient Name: STELLA ENCARNACION Date of Encounter: 12/11/2016  Primary Cardiologist: Dr. Johney Frame  Subjective   Feeling well this morning. No further chest pain.   Inpatient Medications    Scheduled Meds: . aspirin  81 mg Oral Daily  . atorvastatin  40 mg Oral q1800  . clopidogrel  75 mg Oral Q breakfast  . enoxaparin (LOVENOX) injection  40 mg Subcutaneous Q24H  . famotidine  20 mg Oral BID  . lisinopril  10 mg Oral Daily  . metoprolol tartrate  25 mg Oral BID  . sodium chloride flush  3 mL Intravenous Q12H   Continuous Infusions:  PRN Meds: sodium chloride, acetaminophen, morphine injection, nitroGLYCERIN, ondansetron (ZOFRAN) IV, oxyCODONE-acetaminophen, sodium chloride flush   Vital Signs    Vitals:   12/10/16 2030 12/10/16 2100 12/11/16 0614 12/11/16 0751  BP: 138/69 136/69 137/63 129/82  Pulse:   79 70  Resp: 19 18 13    Temp:   98 F (36.7 C) 98.7 F (37.1 C)  TempSrc:   Oral Oral  SpO2:   99% 98%  Weight:   171 lb 15.3 oz (78 kg)   Height:        Intake/Output Summary (Last 24 hours) at 12/11/16 0829 Last data filed at 12/11/16 0752  Gross per 24 hour  Intake          1212.76 ml  Output             1400 ml  Net          -187.24 ml   Filed Weights   12/10/16 0535 12/11/16 0614  Weight: 170 lb (77.1 kg) 171 lb 15.3 oz (78 kg)    Telemetry    SR with PACs - Personally Reviewed  ECG    SR with TWI in leads III, aVF - Personally Reviewed  Physical Exam   GEN: No acute distress. Pleasant WM Neck: No JVD Cardiac: RRR, no murmurs, rubs, or gallops.  Respiratory: Clear to auscultation bilaterally. GI: Soft, nontender, non-distended  MS: No edema; No deformity. Right groin without hematoma, mild bruising. Neuro:  Nonfocal  Psych: Normal affect   Labs    Chemistry Recent Labs Lab 12/05/16 1053 12/10/16 1129 12/11/16 0159  NA 143  --  137  K 4.4  --  3.6  CL 106  --  112*  CO2 22  --  20*  GLUCOSE 101*  --  100*  BUN 13   --  8  CREATININE 0.86 0.80 0.70  CALCIUM 9.4  --  8.1*  PROT 6.4  --   --   ALBUMIN 4.3  --   --   AST 19  --   --   ALT 13  --   --   ALKPHOS 59  --   --   BILITOT 0.5  --   --   GFRNONAA 95 >60 >60  GFRAA 110 >60 >60  ANIONGAP  --   --  5     Hematology Recent Labs Lab 12/05/16 1053 12/10/16 1129 12/11/16 0159  WBC 12.5* 7.3 8.4  RBC 5.07 4.53 4.22  HGB  --  13.5 12.5*  HCT 43.6 39.0 36.0*  MCV 86 86.1 85.3  MCH 29.6 29.8 29.6  MCHC 34.4 34.6 34.7  RDW 13.7 13.6 13.4  PLT 258 179 161    Cardiac EnzymesNo results for input(s): TROPONINI in the last 168 hours. No results for input(s): TROPIPOC in the last 168 hours.  BNPNo results for input(s): BNP, PROBNP in the last 168 hours.   DDimer No results for input(s): DDIMER in the last 168 hours.   Radiology    No results found.  Cardiac Studies   LHC: 12/10/16  Conclusions: 1. Significant 2 vessel coronary artery disease, including 95% midvessel stenosis of very large RCA, as well as chronic total occlusion of small apical LAD. 2. Mild diffuse disease involving LAD and LCx. 3. Normal left ventricular filling pressure. 4. Normal left ventricular contraction. 5. Successful IVUS-guided PCI of mid RCA with placement of Xience Alpine 3.5 x 28 mm drug-eluting stent (post-dilated with 4.5 mm non-compliant balloon) with 0% residual stenosis and TIMI-3 flow.  Recommendations: 1. Admit for overnight observation, given significant pain during and immediately after intervention. 2. Continue nitroglycerin infusion, to be weaned off as chest pain allows. 3. Continue tirofiban infusion for 6 hours. 4. Continue dual antiplatelet therapy with aspirin and clopidogrel for at least 6 months, ideally longer. 5. Aggressive secondary prevention, including escalation of statin therapy to atorvastatin 80 mg daily.    Patient Profile     61 y.o. male with PMH of PAF, HTN, HLD, and carotid artery disease who presented for cath after  reports of chest pain.   Assessment & Plan    1. CAD: Underwent LHC yesterday with Dr. Okey DupreEnd showing 2 vessel disease, 95% RCA treated with DES, and CTO of small apical LAD, normal filling pressures, and normal EF.  -- labs stable this morning. No further episodes of chest pain. Plan for DAPT with ASA and Plavix for at least 6 months, longer if tolerated.  -- continue BB, statin added this admission  2. PAF: Followed by Dr.  Johney FrameAllred. None noted on telemetry. ChadsVasc 1, remains on ASA per EP recommendations.   3. HTN: Controlled.   Signed, Laverda PageLindsay Kendryck Lacroix, NP  12/11/2016, 8:29 AM

## 2016-12-11 NOTE — Telephone Encounter (Signed)
TCM fu appt 12-18-16 with Herma CarsonMichelle Lenze

## 2016-12-11 NOTE — Progress Notes (Signed)
CARDIAC REHAB PHASE I   PRE:  Rate/Rhythm: 83 SR    BP: sitting 124/82    SaO2:   MODE:  Ambulation: 1000 ft   POST:  Rate/Rhythm: 92 SR    BP: sitting 145/79     SaO2:   Tolerated well. Motivated to make change. Ed completed. Understands antiplatelet, smoking cessation (quit Friday), risk factor modification. Will refer to [facility] CRPII.  [id number]   Martin MassonRandi Kristan Reta Shields CES, ACSM 12/11/2016 9:24 AM

## 2016-12-11 NOTE — Discharge Summary (Signed)
Discharge Summary    Patient ID: [name],  MRN: [id number], DOB/AGE: 05/05/1956 61 y.o.  Admit date: 12/10/2016 Discharge date: 12/11/2016  Primary Care Provider: Lorenda Ishihara Primary Cardiologist: Dr. Allred/ Dr. End  Discharge Diagnoses    Principal Problem:   Chest pain Active Problems:   Stable angina (HCC)   Unstable angina (HCC)   Allergies Allergies  Allergen Reactions  . Tetanus Toxoid Other (See Comments)    Convulsions and fever    Diagnostic Studies/Procedures    LHC: 12/10/16  Conclusions: 1. Significant 2 vessel coronary artery disease, including 95% midvessel stenosis of very large RCA, as well as chronic total occlusion of small apical LAD. 2. Mild diffuse disease involving LAD and LCx. 3. Normal left ventricular filling pressure. 4. Normal left ventricular contraction. 5. Successful IVUS-guided PCI of mid RCA with placement of Xience Alpine 3.5 x 28 mm drug-eluting stent (post-dilated with 4.5 mm non-compliant balloon) with 0% residual stenosis and TIMI-3 flow.  Recommendations: 1. Admit for overnight observation, given significant pain during and immediately after intervention. 2. Continue nitroglycerin infusion, to be weaned off as chest pain allows. 3. Continue tirofiban infusion for 6 hours. 4. Continue dual antiplatelet therapy with aspirin and clopidogrel for at least 6 months, ideally longer. 5. Aggressive secondary prevention, including escalation of statin therapy to atorvastatin 80 mg daily.   _____________   History of Present Illness     61 yo male with PMH of PAF, HTN, HLD, tobacco use who presented to the office with reports of exertional chest pain. Given his risk factors and strong family history, decision was made to under go cardiac catheterization.   Hospital Course     Consultants: None  He presented for outpatient cath with Dr. Okey Dupre on 12/10/16 showing showing 2 vessel disease, 95% RCA treated with DES, and  CTO of small apical LAD, normal filling pressures, and normal EF. Labs were stable post procedure. PLan for DAPT with ASA/Plavix for at least 6 months, longer if tolerated. He worked with cardiac rehab without further episodes of chest pain. Telemetry showed no PAF during admission. ChadsVasc is 1, was on 325 ASA prior to admission. This was reduced to 81mg  with the addition of plavix.   He was seen and assessed by Dr. Rennis Golden and determined stable for discharge home. He will remain on BB and ACEi started prior to admission. High dose statin was added this admission.  _____________  Discharge Vitals Blood pressure 129/82, pulse 70, temperature 98.7 F (37.1 C), temperature source Oral, resp. rate 13, height 5\' 8"  (1.727 m), weight 171 lb 15.3 oz (78 kg), SpO2 98 %.  Filed Weights   12/10/16 0535 12/11/16 0614  Weight: 170 lb (77.1 kg) 171 lb 15.3 oz (78 kg)    Labs & Radiologic Studies    CBC  Recent Labs  12/10/16 1129 12/11/16 0159  WBC 7.3 8.4  HGB 13.5 12.5*  HCT 39.0 36.0*  MCV 86.1 85.3  PLT 179 161   Basic Metabolic Panel  Recent Labs  12/10/16 1129 12/11/16 0159  NA  --  137  K  --  3.6  CL  --  112*  CO2  --  20*  GLUCOSE  --  100*  BUN  --  8  CREATININE 0.80 0.70  CALCIUM  --  8.1*   Liver Function Tests No results for input(s): AST, ALT, ALKPHOS, BILITOT, PROT, ALBUMIN in the last 72 hours. No results for input(s): LIPASE, AMYLASE in  the last 72 hours. Cardiac Enzymes No results for input(s): CKTOTAL, CKMB, CKMBINDEX, TROPONINI in the last 72 hours. BNP Invalid input(s): POCBNP D-Dimer No results for input(s): DDIMER in the last 72 hours. Hemoglobin A1C No results for input(s): HGBA1C in the last 72 hours. Fasting Lipid Panel No results for input(s): CHOL, HDL, LDLCALC, TRIG, CHOLHDL, LDLDIRECT in the last 72 hours. Thyroid Function Tests No results for input(s): TSH, T4TOTAL, T3FREE, THYROIDAB in the last 72 hours.  Invalid input(s):  FREET3 _____________  No results found. Disposition   Pt is being discharged home today in good condition.  Follow-up Plans & Appointments    Follow-up Information    Jacolyn ReedyMichele Lenze, PA-C Follow up on 12/18/2016.   Specialty:  Cardiology Why:  sat 9:45am for your follow up appt.  Contact information: [address] [place] [address] [phone]          Discharge Instructions    Call MD for:  redness, tenderness, or signs of infection (pain, swelling, redness, odor or green/yellow discharge around incision site)    Complete by:  As directed    Diet - low sodium heart healthy    Complete by:  As directed    Discharge instructions    Complete by:  As directed    Groin Site Care Refer to this sheet in the next few weeks. These instructions provide you with information on caring for yourself after your procedure. Your caregiver may also give you more specific instructions. Your treatment has been planned according to current medical practices, but problems sometimes occur. Call your caregiver if you have any problems or questions after your procedure. HOME CARE INSTRUCTIONS You may shower 24 hours after the procedure. Remove the bandage (dressing) and gently wash the site with plain soap and water. Gently pat the site dry.  Do not apply powder or lotion to the site.  Do not sit in a bathtub, swimming pool, or whirlpool for 5 to 7 days.  No bending, squatting, or lifting anything over 10 pounds (4.5 kg) as directed by your caregiver.  Inspect the site at least twice daily.  Do not drive home if you are discharged the same day of the procedure. Have someone else drive you.  You may drive 24 hours after the procedure unless otherwise instructed by your caregiver.  What to expect: Any bruising will usually fade within 1 to 2 weeks.  Blood that collects in the tissue (hematoma) may be painful to the touch. It should usually decrease in size and tenderness within 1 to  2 weeks.  SEEK IMMEDIATE MEDICAL CARE IF: You have unusual pain at the groin site or down the affected leg.  You have redness, warmth, swelling, or pain at the groin site.  You have drainage (other than a small amount of blood on the dressing).  You have chills.  You have a fever or persistent symptoms for more than 72 hours.  You have a fever and your symptoms suddenly get worse.  Your leg becomes pale, cool, tingly, or numb.  You have heavy bleeding from the site. Hold pressure on the site. .   Increase activity slowly    Complete by:  As directed       Discharge Medications   Current Discharge Medication List    START taking these medications   Details  aspirin 81 MG chewable tablet Chew 1 tablet (81 mg total) by mouth daily. Qty: 30 tablet    atorvastatin (LIPITOR) 40  MG tablet Take 1 tablet (40 mg total) by mouth daily at 6 PM. Qty: 30 tablet, Refills: 6    clopidogrel (PLAVIX) 75 MG tablet Take 1 tablet (75 mg total) by mouth daily with breakfast. Qty: 30 tablet, Refills: 11      CONTINUE these medications which have NOT CHANGED   Details  Cholecalciferol (VITAMIN D3) 1000 units CAPS Take 1,000 Units by mouth daily.    lisinopril (PRINIVIL,ZESTRIL) 10 MG tablet Take 1 tablet (10 mg total) by mouth daily. Qty: 90 tablet, Refills: 3   Associated Diagnoses: Essential hypertension; Hyperlipidemia, unspecified hyperlipidemia type    metoprolol tartrate (LOPRESSOR) 25 MG tablet Take 1 tablet (25 mg total) by mouth 2 (two) times daily. Qty: 180 tablet, Refills: 3   Associated Diagnoses: Essential hypertension; Hyperlipidemia, unspecified hyperlipidemia type    nitroGLYCERIN (NITROSTAT) 0.4 MG SL tablet Place 1 tablet (0.4 mg total) under the tongue every 5 (five) minutes as needed for chest pain. Qty: 25 tablet, Refills: 3    ranitidine (ZANTAC) 150 MG tablet Take 150 mg by mouth daily as needed for heartburn.    oseltamivir (TAMIFLU) 75 MG capsule Take 1 capsule (75 mg  total) by mouth every 12 (twelve) hours. Qty: 10 capsule, Refills: 0      STOP taking these medications     aspirin EC 325 MG tablet      aspirin-acetaminophen-caffeine (EXCEDRIN MIGRAINE) 250-250-65 MG tablet      simvastatin (ZOCOR) 10 MG tablet          Aspirin prescribed at discharge?  Yes High Intensity Statin Prescribed? (Lipitor 40-80mg  or Crestor 20-40mg ): Yes Beta Blocker Prescribed? Yes For EF <40%, was ACEI/ARB Prescribed? Yes ADP Receptor Inhibitor Prescribed? (i.e. Plavix etc.-Includes Medically Managed Patients): Yes For EF <40%, Aldosterone Inhibitor Prescribed? No: EF normal Was EF assessed during THIS hospitalization? Yes Was Cardiac Rehab II ordered? (Included Medically managed Patients): Yes   Outstanding Labs/Studies   FLP and LFTs in 6 weeks.   Duration of Discharge Encounter   Greater than 30 minutes including physician time.  Signed, Daishaun Ayre NP-C 12/11/2016, 9:23 AM

## 2016-12-12 ENCOUNTER — Ambulatory Visit: Payer: Self-pay | Admitting: Internal Medicine

## 2016-12-12 MED FILL — Nitroglycerin SL Tab 0.4 MG: SUBLINGUAL | Qty: 1 | Status: AC

## 2016-12-12 MED FILL — Verapamil HCl IV Soln 2.5 MG/ML: INTRAVENOUS | Qty: 2 | Status: AC

## 2016-12-12 MED FILL — Morphine Sulfate Inj 10 MG/ML: INTRAMUSCULAR | Qty: 1 | Status: AC

## 2016-12-12 NOTE — Telephone Encounter (Signed)
[facility].   Patient understands to follow up with Jacolyn ReedyMichele Lenze on 12/18/16 at 9:[facility] am [facility].   Patient understands discharge instructions? Yes Patient understands medications and regimen? Yes Patient understands to bring all medications to this visit? Yes  States he is feeling good.  Bandage has been removed. Groin site looks flat, small amount bruising, no redness or drainage. He is aware to call office with any questions or concerns he may have prior to his appointment.

## 2016-12-14 ENCOUNTER — Encounter: Payer: Self-pay | Admitting: Family Medicine

## 2016-12-14 ENCOUNTER — Ambulatory Visit: Payer: Self-pay | Attending: Family Medicine | Admitting: Family Medicine

## 2016-12-14 VITALS — BP 122/73 | HR 57 | Temp 98.2°F | Ht 68.0 in | Wt 182.0 lb

## 2016-12-14 DIAGNOSIS — M79604 Pain in right leg: Secondary | ICD-10-CM | POA: Insufficient documentation

## 2016-12-14 DIAGNOSIS — Z720 Tobacco use: Secondary | ICD-10-CM | POA: Insufficient documentation

## 2016-12-14 DIAGNOSIS — Z7902 Long term (current) use of antithrombotics/antiplatelets: Secondary | ICD-10-CM | POA: Insufficient documentation

## 2016-12-14 DIAGNOSIS — S3792XA Contusion of unspecified urinary and pelvic organ, initial encounter: Secondary | ICD-10-CM | POA: Insufficient documentation

## 2016-12-14 DIAGNOSIS — Z7982 Long term (current) use of aspirin: Secondary | ICD-10-CM | POA: Insufficient documentation

## 2016-12-14 DIAGNOSIS — I480 Paroxysmal atrial fibrillation: Secondary | ICD-10-CM

## 2016-12-14 DIAGNOSIS — Z79899 Other long term (current) drug therapy: Secondary | ICD-10-CM | POA: Insufficient documentation

## 2016-12-14 DIAGNOSIS — IMO0001 Reserved for inherently not codable concepts without codable children: Secondary | ICD-10-CM

## 2016-12-14 DIAGNOSIS — I2510 Atherosclerotic heart disease of native coronary artery without angina pectoris: Secondary | ICD-10-CM

## 2016-12-14 DIAGNOSIS — Z9889 Other specified postprocedural states: Secondary | ICD-10-CM | POA: Insufficient documentation

## 2016-12-14 DIAGNOSIS — X58XXXA Exposure to other specified factors, initial encounter: Secondary | ICD-10-CM | POA: Insufficient documentation

## 2016-12-14 DIAGNOSIS — I10 Essential (primary) hypertension: Secondary | ICD-10-CM

## 2016-12-14 DIAGNOSIS — S37892A Contusion of other urinary and pelvic organ, initial encounter: Secondary | ICD-10-CM

## 2016-12-14 NOTE — Patient Instructions (Signed)
Contusion A contusion is a deep bruise. Contusions are the result of a blunt injury to tissues and muscle fibers under the skin. The injury causes bleeding under the skin. The skin overlying the contusion may turn blue, purple, or yellow. Minor injuries will give you a painless contusion, but more severe contusions may stay painful and swollen for a few weeks. What are the causes? This condition is usually caused by a blow, trauma, or direct force to an area of the body. What are the signs or symptoms? Symptoms of this condition include:  Swelling of the injured area.  Pain and tenderness in the injured area.  Discoloration. The area may have redness and then turn blue, purple, or yellow. How is this diagnosed? This condition is diagnosed based on a physical exam and medical history. An X-ray, CT scan, or MRI may be needed to determine if there are any associated injuries, such as broken bones (fractures). How is this treated? Specific treatment for this condition depends on what area of the body was injured. In general, the best treatment for a contusion is resting, icing, applying pressure to (compression), and elevating the injured area. This is often called the RICE strategy. Over-the-counter anti-inflammatory medicines may also be recommended for pain control. Follow these instructions at home:  Rest the injured area.  If directed, apply ice to the injured area:  Put ice in a plastic bag.  Place a towel between your skin and the bag.  Leave the ice on for 20 minutes, 2-3 times per day.  If directed, apply light compression to the injured area using an elastic bandage. Make sure the bandage is not wrapped too tightly. Remove and reapply the bandage as directed by your health care provider.  If possible, raise (elevate) the injured area above the level of your heart while you are sitting or lying down.  Take over-the-counter and prescription medicines only as told by your health  care provider. Contact a health care provider if:  Your symptoms do not improve after several days of treatment.  Your symptoms get worse.  You have difficulty moving the injured area. Get help right away if:  You have severe pain.  You have numbness in a hand or foot.  Your hand or foot turns pale or cold. This information is not intended to replace advice given to you by your health care provider. Make sure you discuss any questions you have with your health care provider. Document Released: 08/08/2005 Document Revised: 03/08/2016 Document Reviewed: 03/16/2015 Elsevier Interactive Patient Education  2017 [employer]  

## 2016-12-14 NOTE — Progress Notes (Signed)
Subjective:  Patient ID: Hollie Beach, male    DOB: [birth year]  Age: 61 y.o. MRN: [id number]  CC: Hospitalization Follow-up (stent placement); Leg Pain (right); and Hypertension   HPI TIMM BONENBERGER presents To establish care. Medical history is significant for Parroxysmal A. fib, tobacco abuse, hypertension referred for cardiac cath after he presented to cardiology office with chest pain.  He underwent a successful IVUS-guided PCI of mid RCA with placement of Xience Alpine 3.5 x 28 mm drug-eluting stent (post-dilated with 4.5 mm non-compliant balloon) with 0% residual stenosis and TIMI-3 flow with plans for follow-up antiplatelet therapy with aspirin and Plavix for 6 months or longer if tolerated.  Today he denies chest pains or shortness of breath but complains of bruising at the site of the cardiac cath and associated leg pain when he applies pressure or bears weight on his right leg but is refusing pain medication.  Past Medical History:  Diagnosis Date  . Carotid artery occlusion    Right Carotid Bruit  . Chest pain 2003   neg evaluation  . Coronary artery disease    a. 12/10/16 PCI with DES--> RCA, normal EF  . History of kidney stones   . HTN (hypertension)   . Hyperlipemia   . Leg pain    a. ABIs (12/10):  Normal  . Paroxysmal atrial fibrillation (HCC)   . Subclavian steal syndrome Aug. 2015   Occult Right    Past Surgical History:  Procedure Laterality Date  . CARDIAC CATHETERIZATION N/A 12/10/2016   Procedure: Left Heart Cath and Coronary Angiography;  Surgeon: Yvonne Kendall, MD;  Location: [facility] INVASIVE CV LAB;  Service: Cardiovascular;  Laterality: N/A;  . CARDIAC CATHETERIZATION N/A 12/10/2016   Procedure: Coronary Stent Intervention;  Surgeon: Yvonne Kendall, MD;  Location: [facility] INVASIVE CV LAB;  Service: Cardiovascular;  Laterality: N/A;  . CARDIAC CATHETERIZATION N/A 12/10/2016   Procedure: Intravascular Ultrasound/IVUS;  Surgeon: Yvonne Kendall, MD;   Location: [facility] INVASIVE CV LAB;  Service: Cardiovascular;  Laterality: N/A;  . COLONOSCOPY  10/2007   negative results  . COLONOSCOPY W/ POLYPECTOMY  2003  . CORONARY ANGIOPLASTY WITH STENT PLACEMENT  12/10/2016  . CYSTECTOMY     removed from back  . epidural steroids     in the back x3  . JOINT REPLACEMENT Right February 29, 1976   KNEE  . REPAIR PATELLAR TENDON Right   . SHOULDER ARTHROSCOPY WITH OPEN ROTATOR CUFF REPAIR Right     Allergies  Allergen Reactions  . Tetanus Toxoid Other (See Comments)    Convulsions and fever     Outpatient Medications Prior to Visit  Medication Sig Dispense Refill  . aspirin 81 MG chewable tablet Chew 1 tablet (81 mg total) by mouth daily. 30 tablet   . atorvastatin (LIPITOR) 40 MG tablet Take 1 tablet (40 mg total) by mouth daily at 6 PM. 30 tablet 6  . Cholecalciferol (VITAMIN D3) 1000 units CAPS Take 1,000 Units by mouth daily.    . clopidogrel (PLAVIX) 75 MG tablet Take 1 tablet (75 mg total) by mouth daily with breakfast. 30 tablet 11  . lisinopril (PRINIVIL,ZESTRIL) 10 MG tablet Take 1 tablet (10 mg total) by mouth daily. 90 tablet 3  . metoprolol tartrate (LOPRESSOR) 25 MG tablet Take 1 tablet (25 mg total) by mouth 2 (two) times daily. 180 tablet 3  . nitroGLYCERIN (NITROSTAT) 0.4 MG SL tablet Place 1 tablet (0.4 mg total) under the tongue every 5 (five) minutes as needed for  chest pain. (Patient not taking: Reported on 12/14/2016) 25 tablet 3  . ranitidine (ZANTAC) 150 MG tablet Take 150 mg by mouth daily as needed for heartburn.     No facility-administered medications prior to visit.     ROS Review of Systems  Constitutional: Negative for activity change and appetite change.  HENT: Negative for sinus pressure and sore throat.   Eyes: Negative for visual disturbance.  Respiratory: Negative for cough, chest tightness and shortness of breath.   Cardiovascular: Negative for chest pain and leg swelling.  Gastrointestinal: Negative for  abdominal distention, abdominal pain, constipation and diarrhea.  Endocrine: Negative.   Genitourinary: Negative for dysuria.  Musculoskeletal:       See hpi  Skin:       See hpi  Allergic/Immunologic: Negative.   Neurological: Negative for weakness, light-headedness and numbness.  Psychiatric/Behavioral: Negative for dysphoric mood and suicidal ideas.    Objective:  BP 122/73 (BP Location: Right Arm, Patient Position: Sitting, Cuff Size: Small)   Pulse (!) 57   Temp 98.2 F (36.8 C) (Oral)   Ht 5\' 8"  (1.727 m)   Wt 182 lb (82.6 kg)   SpO2 100%   BMI 27.67 kg/m   BP/Weight 12/14/2016 12/11/2016 12/10/2016  Systolic BP 122 129 -  Diastolic BP 73 82 -  Wt. (Lbs) 182 171.96 -  BMI 27.67 - 26.15      Physical Exam  Constitutional: He is oriented to person, place, and time. He appears well-developed and well-nourished.  Cardiovascular: Normal rate, normal heart sounds and intact distal pulses.   No murmur heard. Pulmonary/Chest: Effort normal and breath sounds normal. He has no wheezes. He has no rales. He exhibits no tenderness.  Abdominal: Soft. Bowel sounds are normal. He exhibits no distension and no mass. There is no tenderness.  Musculoskeletal: Normal range of motion.  Neurological: He is alert and oriented to person, place, and time.  Skin:  Large bruise on right side of inguinal region and anterior thigh with associated edema.     Assessment & Plan:   1. Paroxysmal atrial fibrillation (HCC) Currently in sinus rhythm  2. Essential hypertension Controlled  3. Coronary artery disease involving native coronary artery of native heart without angina pectoris Status post IVUS-guided PCI of mid RCA with placement of Xience Alpine 3.5 x 28 mm drug-eluting stent  Continue Plavix Keep appointment with cardiology  4. Contusion of other genitourinary structure, initial encounter Advised to apply ice and to notify the clinic in the event that contusion progressively  worsens   No orders of the defined types were placed in this encounter.   Follow-up: Return in about 3 months (around 03/13/2017) for follow up of chronic medical condition.   Jaclyn ShaggyEnobong Amao MD

## 2016-12-18 ENCOUNTER — Ambulatory Visit (INDEPENDENT_AMBULATORY_CARE_PROVIDER_SITE_OTHER): Payer: Self-pay | Admitting: Physician Assistant

## 2016-12-18 ENCOUNTER — Encounter: Payer: Self-pay | Admitting: Physician Assistant

## 2016-12-18 VITALS — BP 104/52 | HR 84 | Ht 68.0 in | Wt 181.0 lb

## 2016-12-18 DIAGNOSIS — I2510 Atherosclerotic heart disease of native coronary artery without angina pectoris: Secondary | ICD-10-CM

## 2016-12-18 DIAGNOSIS — S3012XA Contusion of groin, initial encounter: Secondary | ICD-10-CM | POA: Insufficient documentation

## 2016-12-18 DIAGNOSIS — S301XXA Contusion of abdominal wall, initial encounter: Secondary | ICD-10-CM

## 2016-12-18 DIAGNOSIS — I10 Essential (primary) hypertension: Secondary | ICD-10-CM

## 2016-12-18 DIAGNOSIS — I480 Paroxysmal atrial fibrillation: Secondary | ICD-10-CM

## 2016-12-18 DIAGNOSIS — F17200 Nicotine dependence, unspecified, uncomplicated: Secondary | ICD-10-CM

## 2016-12-18 NOTE — Progress Notes (Signed)
Cardiology Office Note    Date:  12/18/2016   ID:  Martin BeachRobert T Evetts, DOB 07/23/1956, MRN [id number]  PCP:  Lorenda Ishiharaupashree Varadarajan, MD  Cardiologist: Dr.End EPS: Dr. Johney FrameAllred  Chief Complaint  Patient presents with  . Follow-up    History of Present Illness:  [name] is a 61 y.o. male with PMH of PAF, HTN, HLD, tobacco use who presented to the office with reports of exertional chest pain.Cardiac catheterization was done 12/10/16 and he underwent DES to the RCA and CTO a small apical LAD, normal filling pressures and normal LVEF. He maintained normal sinus rhythm, CHADSVASC was 1 but is now 2 for HTN and CAD.  Patient comes in today for follow-up. He has had no further chest tightness or pressure. He is having some anxiety when he is watching TV at night and thinks he is having palpitations like A. fib that last about 10 minutes but then goes away. He also had a lot of trouble with his groin at the cath site. He had some pain and swelling and bruising. He quit smoking and wants to start an exercise program.       Past Medical History:  Diagnosis Date  . Carotid artery occlusion    Right Carotid Bruit  . Chest pain 2003   neg evaluation  . Coronary artery disease    a. 12/10/16 PCI with DES--> RCA, normal EF  . History of kidney stones   . HTN (hypertension)   . Hyperlipemia   . Leg pain    a. ABIs (12/10):  Normal  . Paroxysmal atrial fibrillation (HCC)   . Subclavian steal syndrome Aug. 2015   Occult Right    Past Surgical History:  Procedure Laterality Date  . CARDIAC CATHETERIZATION N/A 12/10/2016   Procedure: Left Heart Cath and Coronary Angiography;  Surgeon: Yvonne Kendallhristopher End, MD;  [facility];  Service: Cardiovascular;  Laterality: N/A;  . CARDIAC CATHETERIZATION N/A 12/10/2016   Procedure: Coronary Stent Intervention;  Surgeon: Yvonne Kendallhristopher End, MD;  Location: [facility] CV [facility];  Service: Cardiovascular;  Laterality: N/A;  . CARDIAC  CATHETERIZATION N/A 12/10/2016   Procedure: Intravascular Ultrasound/IVUS;  Surgeon: Yvonne Kendallhristopher End, MD;  Location: [facility]AS[facility] CV LAB;  Service: Cardiovascular;  Laterality: N/A;  . COLONOSCOPY  10/2007   negative results  . COLONOSCOPY W/ POLYPECTOMY  2003  . CORONARY ANGIOPLASTY WITH STENT PLACEMENT  12/10/2016  . CYSTECTOMY     removed from back  . epidural steroids     in the back x3  . JOINT REPLACEMENT Right February 29, 1976   KNEE  . REPAIR PATELLAR TENDON Right   . SHOULDER ARTHROSCOPY WITH OPEN ROTATOR CUFF REPAIR Right     Current Medications: Outpatient Medications Prior to Visit  Medication Sig Dispense Refill  . aspirin 81 MG chewable tablet Chew 1 tablet (81 mg total) by mouth daily. 30 tablet   . atorvastatin (LIPITOR) 40 MG tablet Take 1 tablet (40 mg total) by mouth daily at 6 PM. 30 tablet 6  . Cholecalciferol (VITAMIN D3) 1000 units CAPS Take 1,000 Units by mouth daily.    . clopidogrel (PLAVIX) 75 MG tablet Take 1 tablet (75 mg total) by mouth daily with breakfast. 30 tablet 11  . lisinopril (PRINIVIL,ZESTRIL) 10 MG tablet Take 1 tablet (10 mg total) by mouth daily. 90 tablet 3  . metoprolol tartrate (LOPRESSOR) 25 MG tablet Take 1 tablet (25 mg total) by mouth 2 (two) times daily. 180 tablet 3  .  nitroGLYCERIN (NITROSTAT) 0.4 MG SL tablet Place 1 tablet (0.4 mg total) under the tongue every 5 (five) minutes as needed for chest pain. 25 tablet 3  . ranitidine (ZANTAC) 150 MG tablet Take 150 mg by mouth daily as needed for heartburn.     No facility-administered medications prior to visit.      Allergies:   Tetanus toxoid   Social History   Social History  . Marital status: Divorced    Spouse name: N/A  . Number of children: N/A  . Years of education: N/A   Occupational History  . 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    [employer]   Social History Main Topics  . Smoking status: Former Smoker    Packs/day: 0.50    Years: 27.00    Types: Cigarettes    Quit date:  12/07/2016  . Smokeless tobacco: Never Used     Comment: one week ago  . Alcohol use Yes     Comment: 12/10/2016 "if I have a beer once/month that's alot"  . Drug use: No  . Sexual activity: Not Currently   Other Topics Concern  . None   Social History Narrative  . None     Family History:  The patient's family history includes Cancer in his brother; Heart attack in his father; Heart disease in his father; Stroke in his brother.   ROS:   Please see the history of present illness.    Review of Systems  Constitution: Negative.  HENT: Negative.   Cardiovascular: Positive for dyspnea on exertion and palpitations.  Respiratory: Positive for cough.   Endocrine: Negative.   Hematologic/Lymphatic: Bruises/bleeds easily.  Musculoskeletal: Negative.        Groin pain at cath site  Gastrointestinal: Negative.   Genitourinary: Negative.   Neurological: Negative.    All other systems reviewed and are negative.   PHYSICAL EXAM:   VS:  BP (!) 104/52   Pulse 84   Ht 5\' 8"  (1.727 m)   Wt 181 lb (82.1 kg)   SpO2 97%   BMI 27.52 kg/m   Physical Exam  GEN: Well nourished, well developed, in no acute distress  Neck:Without carotid bruits right greater than left no JVD, or masses Cardiac:RRR; positive S4 no murmurs, rubs Respiratory:  Decreased breath sounds but clear GI: soft, nontender, nondistended, + BS Ext: Right groin with significant hematoma that is pretty soft but he does have a widened pulse and right femoral bruit. Good distal pulses bilaterally Psych: euthymic mood, full affect  Wt Readings from Last 3 Encounters:  12/18/16 181 lb (82.1 kg)  12/14/16 182 lb (82.6 kg)  12/11/16 171 lb 15.3 oz (78 kg)      Studies/Labs Reviewed:   EKG:  EKG is not ordered today.   Recent Labs: 12/05/2016: ALT 13 12/11/2016: BUN 8; Creatinine, Ser 0.70; Hemoglobin 12.5; Platelets 161; Potassium 3.6; Sodium 137   Lipid Panel    Component Value Date/Time   CHOL 154 12/05/2016 1053    TRIG 259 (H) 12/05/2016 1053   HDL 41 12/05/2016 1053   CHOLHDL 3.8 12/05/2016 1053   CHOLHDL 5.1 CALC 10/04/2008 0847   VLDL 28 10/04/2008 0847   LDLCALC 61 12/05/2016 1053   LDLDIRECT 120.9 10/04/2008 0847    Additional studies/ records that were reviewed today include:   LHC: 12/10/16   Conclusions: 1. Significant 2 vessel coronary artery disease, including 95% midvessel stenosis of very large RCA, as well as chronic total occlusion of small apical LAD. 2.  Mild diffuse disease involving LAD and LCx. 3. Normal left ventricular filling pressure. 4. Normal left ventricular contraction. 5. Successful IVUS-guided PCI of mid RCA with placement of Xience Alpine 3.5 x 28 mm drug-eluting stent (post-dilated with 4.5 mm non-compliant balloon) with 0% residual stenosis and TIMI-3 flow.   Recommendations: 1. Admit for overnight observation, given significant pain during and immediately after intervention. 2. Continue nitroglycerin infusion, to be weaned off as chest pain allows. 3. Continue tirofiban infusion for 6 hours. 4. Continue dual antiplatelet therapy with aspirin and clopidogrel for at least 6 months, ideally longer. 5. Aggressive secondary prevention, including escalation of statin therapy to atorvastatin 80 mg daily.       ASSESSMENT:    1. Coronary artery disease involving native coronary artery of native heart without angina pectoris   2. Essential hypertension   3. Paroxysmal atrial fibrillation (HCC)   4. Tobacco use disorder   5. Hematoma of groin, initial encounter      PLAN:  In order of problems listed above:  CAD status post recent DES to the RCA and chronic total occlusion of a small LAD. Continue aspirin and Plavix, Lipitor and metoprolol. Follow-up with Dr.End in 4-6 weeks.  Essential hypertension blood pressure is on the low side. Continue current dose of lisinopril and metoprolol  PAF with some palpitations.CHADSVASC was 1 but is now 2 for HTN, CAD. Will  discuss with Dr. Earlie Server. Johney Frame about starting NOAC.  Tobacco use disorder patient quit smoking  Hematoma the right groin and cath site with widened pulse and bruit. We'll order duplex to rule out pseudoaneurysm.  Carotid bruits needs follow-up Dopplers    Medication Adjustments/Labs and Tests Ordered: Current medicines are reviewed at length with the patient today.  Concerns regarding medicines are outlined above.  Medication changes, Labs and Tests ordered today are listed in the Patient Instructions below. There are no Patient Instructions on file for this visit.   Signed, Jacolyn Reedy, PA-C  12/18/2016 10:16 AM    [facility] [address] [place], [place], [place]  [address] Phone: [phone]; Fax: [phone]

## 2016-12-18 NOTE — Patient Instructions (Signed)
Medication Instructions:  Your physician recommends that you continue on your current medications as directed. Please refer to the Current Medication list given to you today.   Labwork: None  Testing/Procedures: Marcelino DusterMichelle recommends you have an ultrasound of your RIGHT GROIN.  Follow-Up: Your physician recommends that you schedule a follow-up appointment with Dr. Myra GianottiBrabham.  Your physician recommends that you schedule a follow-up appointment in: 4-6 weeks with Dr. Okey DupreEnd.   Any Other Special Instructions Will Be Listed Below (If Applicable).     If you need a refill on your cardiac medications before your next appointment, please call your pharmacy.

## 2016-12-18 NOTE — Progress Notes (Signed)
If there is question of pseudoaneurysm, I would hold off on putting him on triple therapy at this time.  You may want to check with Dr. Johney FrameAllred, who follows his a-fib.  Maybe we could try clopidogrel and a NOAC after 1 month of DAPT, if there is no evidence of vascular complication.

## 2016-12-19 ENCOUNTER — Ambulatory Visit (HOSPITAL_COMMUNITY)
Admission: RE | Admit: 2016-12-19 | Discharge: 2016-12-19 | Disposition: A | Discharge status: Home / Self Care | Payer: Self-pay | Source: Ambulatory Visit | Attending: Cardiovascular Disease | Admitting: Cardiovascular Disease

## 2016-12-19 ENCOUNTER — Telehealth: Payer: Self-pay | Admitting: Internal Medicine

## 2016-12-19 ENCOUNTER — Other Ambulatory Visit: Payer: Self-pay | Admitting: Cardiovascular Disease

## 2016-12-19 DIAGNOSIS — T81719A Complication of unspecified artery following a procedure, not elsewhere classified, initial encounter: Principal | ICD-10-CM

## 2016-12-19 DIAGNOSIS — I10 Essential (primary) hypertension: Secondary | ICD-10-CM | POA: Insufficient documentation

## 2016-12-19 DIAGNOSIS — S301XXA Contusion of abdominal wall, initial encounter: Secondary | ICD-10-CM

## 2016-12-19 DIAGNOSIS — X58XXXA Exposure to other specified factors, initial encounter: Secondary | ICD-10-CM | POA: Insufficient documentation

## 2016-12-19 DIAGNOSIS — E785 Hyperlipidemia, unspecified: Secondary | ICD-10-CM | POA: Insufficient documentation

## 2016-12-19 DIAGNOSIS — IMO0001 Reserved for inherently not codable concepts without codable children: Secondary | ICD-10-CM

## 2016-12-19 DIAGNOSIS — I728 Aneurysm of other specified arteries: Principal | ICD-10-CM

## 2016-12-19 DIAGNOSIS — Z720 Tobacco use: Secondary | ICD-10-CM | POA: Insufficient documentation

## 2016-12-19 NOTE — Telephone Encounter (Signed)
I spoke with [name] regarding the results of his right groin ultrasound demonstrating a pseudoaneurysm. Patient was evaluated by Dr. Allyson SabalBerry at the time of the ultrasound, which demonstrated only a small area of flow with most of the pseudoaneurysm being thrombosed. It was recommended that repeat ultrasound be performed in 6 weeks. Patient notes mild soreness in the right groin, which has been stable. He reports resolution of his chest pain as well as improved shortness of breath. He happily notes that he has stopped smoking.  I agree with Dr. Hazle CocaBerry's recommendations of repeat ultrasound in 6 weeks to reevaluate the pseudoaneurysm. If it persists on follow-up, thrombin injection versus ultrasound compression would need to be considered. Given the presence of the pseudoaneurysm and ongoing dual antiplatelet therapy, we have agreed to hold off on starting warfarin. After confirmation that the pseudoaneurysm has resolved, I would favor starting warfarin with continuation of clopidogrel and discontinuation of aspirin.  [name], MD [facility] Pager: [phone]

## 2016-12-21 NOTE — Telephone Encounter (Signed)
Thanks for your follow up!!! I really appreciate it!!

## 2016-12-24 ENCOUNTER — Encounter (HOSPITAL_COMMUNITY): Payer: Self-pay

## 2016-12-26 NOTE — Progress Notes (Deleted)
Follow-up Outpatient Visit Date: 12/27/2016  Primary Care Provider: Lorenda Ishihara, MD [address] [place] [address]  Chief Complaint: Follow-up chest pain and right groin pseudoaneurysm  HPI:  [name] is a 61 y.o. year-old male with history of CAD s/p PCI to , mid RCA in 11/2016, paroxysmal atrial fibrillation, right subclavian stenosis, possible TIA, hyperlipidemia, and tobacco use, who presents for follow-up of chest pain following recent PCI. He had experienced progressive shortness of breath and chest pain leading up to [facility] on 12/10/16 with occasional chest pain present even at rest. He was found to have high-grade mid RCA stenosis, as well as chronic total occlusion of apical LAD. He underwent successful PCI to the mid RCA. His hospital course was uncomplicated. However, on follow-up last week he was noted to have right groin soreness at the arteriotomy site with positive bruit. 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 The patient was evaluated by Dr. Allyson Sabal at the time of the ultrasound, with recommendation of repeat ultrasound in 6 weeks to evaluate for complete thrombosis of the pseudoaneurysm.  --------------------------------------------------------------------------------------------------  Cardiovascular History & Procedures: Cardiovascular Problems:  Coronary artery disease  Right subclavian artery stenosis  Possible TIA  Paroxysmal atrial fibrillation  Risk Factors:  Known CAD and PVD, hypertension, hyperlipidemia, male gender, and age > 82  Cath/PCI:  LHC/PCI (12/10/16): LMCA normal. LAD with mild diffuse disease, as well as chronic total occlusion of small apical lAD. Mild diffuse LCx disease. Very large, dominant RCA with 95% midvessel stenosis. Successul IVUS-guided PCI to the mid RCA with placement of Xience Alpine 3.5 x 28 mm DES post-dilated with 4.5 mm Angels balloon. Normal LV filling pressure  and LV contraction (EF 55-65%).  CV Surgery:  None  EP Procedures and Devices:  None  Non-Invasive Evaluation(s):  TTE (11/10/15): Normal LV size and function with LVEF 55-60%. Normal diastolic function. Mild MR. Normal RV size and function.  Bilateral carotid Doppler: Mild bilateral carotid artery disease (<40%). Suspected right subclavian artery occlusive disease.  Recent CV Pertinent Labs: Lab Results  Component Value Date   CHOL 154 12/05/2016   HDL 41 12/05/2016   LDLCALC 61 12/05/2016   LDLDIRECT 120.9 10/04/2008   TRIG 259 (H) 12/05/2016   CHOLHDL 3.8 12/05/2016   CHOLHDL 5.1 CALC 10/04/2008   INR 1.0 12/05/2016   K 3.6 12/11/2016   BUN 8 12/11/2016   BUN 13 12/05/2016   CREATININE 0.70 12/11/2016   CREATININE 0.88 10/21/2015    Past medical and surgical history were reviewed and updated in EPIC.  Outpatient Encounter Prescriptions as of 12/27/2016  Medication Sig  . aspirin 81 MG chewable tablet Chew 1 tablet (81 mg total) by mouth daily.  [name] atorvastatin (LIPITOR) 40 MG tablet Take 1 tablet (40 mg total) by mouth daily at 6 PM.  . Cholecalciferol (VITAMIN D3) 1000 units CAPS Take 1,000 Units by mouth daily.  . clopidogrel (PLAVIX) 75 MG tablet Take 1 tablet (75 mg total) by mouth daily with breakfast.  . lisinopril (PRINIVIL,ZESTRIL) 10 MG tablet Take 1 tablet (10 mg total) by mouth daily.  . metoprolol tartrate (LOPRESSOR) 25 MG tablet Take 1 tablet (25 mg total) by mouth 2 (two) times daily.  . nitroGLYCERIN (NITROSTAT) 0.4 MG SL tablet Place 1 tablet (0.4 mg total) under the tongue every 5 (five) minutes as needed for chest pain.  . ranitidine (ZANTAC) 150 MG tablet Take 150 mg by mouth daily as needed  for heartburn.   No facility-administered encounter medications on file as of 12/27/2016.     Allergies: Tetanus toxoid  Social History   Social History  . Marital status: Divorced    Spouse name: N/A  . Number of children: N/A  . Years of education:  N/A   Occupational History  . 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    [employer]   Social History Main Topics  . Smoking status: Former Smoker    Packs/day: 0.50    Years: 27.00    Types: Cigarettes    Quit date: 12/07/2016  . Smokeless tobacco: Never Used     Comment: one week ago  . Alcohol use Yes     Comment: 12/10/2016 "if I have a beer once/month that's alot"  . Drug use: No  . Sexual activity: Not Currently   Other Topics Concern  . Not on file   Social History Narrative  . No narrative on file    Family History  Problem Relation Age of Onset  . Heart disease Father     After age 60  . Heart attack Father   . Cancer Brother   . Stroke Brother     Review of Systems: A 12-system review of systems was performed and was negative except as noted in the HPI.  --------------------------------------------------------------------------------------------------  Physical Exam: There were no vitals taken for this visit.  General:  *** HEENT: No conjunctival pallor or scleral icterus.  Moist mucous membranes.  OP clear. Neck: Supple without lymphadenopathy, thyromegaly, JVD, or HJR.  No carotid bruit. Lungs: Normal work of breathing.  Clear to auscultation bilaterally without wheezes or crackles. Heart: Regular rate and rhythm without murmurs, rubs, or gallops.  Non-displaced PMI. Abd: Bowel sounds present.  Soft, NT/ND without hepatosplenomegaly Ext: No lower extremity edema.  Radial, PT, and DP pulses are 2+ bilaterally. Skin: warm and dry without rash  EKG:  ***  Lab Results  Component Value Date   WBC 8.4 12/11/2016   HGB 12.5 (L) 12/11/2016   HCT 36.0 (L) 12/11/2016   MCV 85.3 12/11/2016   PLT 161 12/11/2016    Lab Results  Component Value Date   NA 137 12/11/2016   K 3.6 12/11/2016   CL 112 (H) 12/11/2016   CO2 20 (L) 12/11/2016   BUN 8 12/11/2016   CREATININE 0.70 12/11/2016   GLUCOSE 100 (H) 12/11/2016   ALT 13 12/05/2016    Lab Results  Component Value  Date   CHOL 154 12/05/2016   HDL 41 12/05/2016   LDLCALC 61 12/05/2016   LDLDIRECT 120.9 10/04/2008   TRIG 259 (H) 12/05/2016   CHOLHDL 3.8 12/05/2016    --------------------------------------------------------------------------------------------------  ASSESSMENT AND PLAN: [name]***  Veanna Dower, MD 12/26/2016 7:56 PM

## 2016-12-27 ENCOUNTER — Ambulatory Visit: Payer: Self-pay | Admitting: Internal Medicine

## 2017-01-03 ENCOUNTER — Encounter: Payer: Self-pay | Admitting: Internal Medicine

## 2017-01-03 ENCOUNTER — Ambulatory Visit (INDEPENDENT_AMBULATORY_CARE_PROVIDER_SITE_OTHER): Payer: Self-pay | Admitting: Internal Medicine

## 2017-01-03 VITALS — BP 98/68 | HR 72 | Ht 68.0 in | Wt 182.6 lb

## 2017-01-03 DIAGNOSIS — IMO0002 Reserved for concepts with insufficient information to code with codable children: Secondary | ICD-10-CM | POA: Insufficient documentation

## 2017-01-03 DIAGNOSIS — I480 Paroxysmal atrial fibrillation: Secondary | ICD-10-CM

## 2017-01-03 DIAGNOSIS — I10 Essential (primary) hypertension: Secondary | ICD-10-CM

## 2017-01-03 DIAGNOSIS — I25119 Atherosclerotic heart disease of native coronary artery with unspecified angina pectoris: Secondary | ICD-10-CM

## 2017-01-03 DIAGNOSIS — I728 Aneurysm of other specified arteries: Secondary | ICD-10-CM

## 2017-01-03 DIAGNOSIS — IMO0001 Reserved for inherently not codable concepts without codable children: Secondary | ICD-10-CM

## 2017-01-03 DIAGNOSIS — T81719D Complication of unspecified artery following a procedure, not elsewhere classified, subsequent encounter: Secondary | ICD-10-CM

## 2017-01-03 DIAGNOSIS — E782 Mixed hyperlipidemia: Secondary | ICD-10-CM

## 2017-01-03 NOTE — Progress Notes (Signed)
Follow-up Outpatient Visit Date: 01/03/2017  Primary Care Provider: Lorenda Ishihara, MD [address] [place] [address]  Chief Complaint: Follow-up coronary artery disease and right groin pseudoaneurysm  HPI:  [name] is a 61 y.o. year-old male with history of paroxysmal atrial fibrillation, coronary artery disease status post PCI to the mid RCA in 11/2016 complicated by right groin pseudoaneurysm, right subclavian artery stenosis, hypertension, and hyperlipidemia, who presents for follow-up of CAD and right groin pseudoaneurysm after recent PCI. Patient reports feeling well. He is noted to episodes of pinpoint substernal chest pain lasting only a few seconds. The pain is different than the substernal chest pressure radiating to the left arm that he experienced prior to PCI. His breathing is better. He does not have significant dyspnea but has developed a cough with clear sputum production that he attributes to stopping smoking. He denies orthopnea, PND, leg edema, and lightheadedness. He notes occasional palpitations that he describes as skipped beats. Episodes typically last one to 2 minutes but can last up to 10 minutes. They are nowhere as severe as when he first developed atrial fibrillation in 2002.  [name] has been compliant with medications, including dual antiplatelet therapy with aspirin and clopidogrel. He has been checking his blood pressure at home on a regular basis and notes that is typically normal. However, last night it was somewhat elevated at 144/88.  --------------------------------------------------------------------------------------------------  Cardiovascular History & Procedures: Cardiovascular Problems:  Coronary artery disease status post PCI to mid RCA 11/2016  Paroxysmal atrial fibrillation  Right subclavian artery stenosis  Risk Factors:  Known coronary artery disease and peripheral vascular disease, hypertension,  hyperlipidemia, male gender, and age > 38  Cath/PCI:  LHC/PCI (12/10/16): Significant two-vessel CAD including 95% mid RCA stenosis and chronic total occlusion of small apical LAD. Mild diffuse disease involving LAD and LCx also evident. Left ventricular filling pressure and contraction normal. Successful IVUS guided PCI to mid RCA with placement of a Xience Alpine 3.5 x 28 mm drug-eluting stent postdilated to 4.5 mm.  CV Surgery:  None  EP Procedures and Devices:  None  Non-Invasive Evaluation(s):  Right groin ultrasound (12/19/16): Pseudoaneurysm arising from the right mid common femoral artery measuring 2.5 x 2.1 x 1.1 cm. Pseudoaneurysm is largely thrombosed of some arterial flow is seen. Patent right iliac, CFA, and proximal SFA/PFA.  Transthoracic echocardiogram (11/10/15): Normal LV size and function (EF 55-60%). Mild MR. Normal RV size and function.  Recent CV Pertinent Labs: Lab Results  Component Value Date   CHOL 154 12/05/2016   HDL 41 12/05/2016   LDLCALC 61 12/05/2016   LDLDIRECT 120.9 10/04/2008   TRIG 259 (H) 12/05/2016   CHOLHDL 3.8 12/05/2016   CHOLHDL 5.1 CALC 10/04/2008   INR 1.0 12/05/2016   K 3.6 12/11/2016   BUN 8 12/11/2016   BUN 13 12/05/2016   CREATININE 0.70 12/11/2016   CREATININE 0.88 10/21/2015    Past medical and surgical history were reviewed and updated in EPIC.  Outpatient Encounter Prescriptions as of 01/03/2017  Medication Sig  . aspirin 81 MG chewable tablet Chew 1 tablet (81 mg total) by mouth daily.  [name] atorvastatin (LIPITOR) 40 MG tablet Take 1 tablet (40 mg total) by mouth daily at 6 PM.  . Cholecalciferol (VITAMIN D3) 1000 units CAPS Take 1,000 Units by mouth daily.  . clopidogrel (PLAVIX) 75 MG tablet Take 1 tablet (75 mg total) by mouth daily with breakfast.  . lisinopril (PRINIVIL,ZESTRIL) 10 MG tablet Take 1  tablet (10 mg total) by mouth daily.  . metoprolol tartrate (LOPRESSOR) 25 MG tablet Take 1 tablet (25 mg total) by mouth 2  (two) times daily.  . nitroGLYCERIN (NITROSTAT) 0.4 MG SL tablet Place 1 tablet (0.4 mg total) under the tongue every 5 (five) minutes as needed for chest pain.  . ranitidine (ZANTAC) 150 MG tablet Take 150 mg by mouth daily as needed for heartburn.   No facility-administered encounter medications on file as of 01/03/2017.     Allergies: Tetanus toxoid  Social History   Social History  . Marital status: Divorced    Spouse name: N/A  . Number of children: N/A  . Years of education: N/A   Occupational History  . 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    [employer]   Social History Main Topics  . Smoking status: Former Smoker    Packs/day: 0.50    Years: 27.00    Types: Cigarettes    Quit date: 12/07/2016  . Smokeless tobacco: Never Used     Comment: one week ago  . Alcohol use Yes     Comment: 12/10/2016 "if I have a beer once/month that's alot"  . Drug use: No  . Sexual activity: Not Currently   Other Topics Concern  . Not on file   Social History Narrative  . No narrative on file    Family History  Problem Relation Age of Onset  . Heart disease Father     After age 47  . Heart attack Father   . Cancer Brother   . Stroke Brother     Review of Systems: A 12-system review of systems was performed and was negative except as noted in the HPI.  --------------------------------------------------------------------------------------------------  Physical Exam: BP 94/70 (BP Location: Left Arm, Patient Position: Sitting, Cuff Size: Normal)   Pulse 72   Ht 5\' 8"  (1.727 m)   Wt 182 lb 9.6 oz (82.8 kg)   SpO2 96%   BMI 27.76 kg/m   General:  Well-developed, well-nourished man seated comfortably in the exam room. HEENT: No conjunctival pallor or scleral icterus.  Moist mucous membranes.  OP clear. Neck: Supple without lymphadenopathy, thyromegaly, JVD, or HJR. Lungs: Normal work of breathing.  Clear to auscultation bilaterally without wheezes or crackles. Heart: Regular rate and  rhythm without murmurs, rubs, or gallops.  Non-displaced PMI. Abd: Bowel sounds present.  Soft, NT/ND without hepatosplenomegaly Ext: No lower extremity edema.  Radial, PT, and DP pulses are 2+ bilaterally. Right femoral pulse is 2+ without hematoma or other palpable mass. No bruit. Skin: warm and dry without rash  EKG:  Normal sinus rhythm with nonspecific T-wave changes and low voltage. No significant change from prior tracing on 12/11/78 (I have personally reviewed both tracings).  Lab Results  Component Value Date   WBC 8.4 12/11/2016   HGB 12.5 (L) 12/11/2016   HCT 36.0 (L) 12/11/2016   MCV 85.3 12/11/2016   PLT 161 12/11/2016    Lab Results  Component Value Date   NA 137 12/11/2016   K 3.6 12/11/2016   CL 112 (H) 12/11/2016   CO2 20 (L) 12/11/2016   BUN 8 12/11/2016   CREATININE 0.70 12/11/2016   GLUCOSE 100 (H) 12/11/2016   ALT 13 12/05/2016    Lab Results  Component Value Date   CHOL 154 12/05/2016   HDL 41 12/05/2016   LDLCALC 61 12/05/2016   LDLDIRECT 120.9 10/04/2008   TRIG 259 (H) 12/05/2016   CHOLHDL 3.8 12/05/2016    --------------------------------------------------------------------------------------------------  ASSESSMENT AND PLAN: Coronary artery disease with atypical chest pain Patient overall is doing quite well following PCI to the mid RCA last month. His 2 episodes of very brief chest pain are atypical and different than the angina he experienced prior to PCI. He is tolerating dual antiplatelet therapy well. We will try complete a year of clopidogrel plus either aspirin or other anticoagulation given his a-fib (see details below). We will readdress referral to cardiac rehabilitation once groin ultrasound has been completed to confirm resolution of pseudoaneurysm. He should continue metoprolol and atorvastatin.  Right groin pseudoaneurysm This was discovered on recent ultrasound. Today, exam is unremarkable without mass or bruit. Femoral and pedal  pulses are normal. I suspect that the pseudoaneurysm has now thrombosed. We will proceed with repeat ultrasound ordered for next month.  Paroxysmal atrial fibrillation Patient is in sinus rhythm today. He notes occasional palpitations. His CHADSVASC score is 2, given hypertension and coronary artery disease. We discussed addition of anticoagulation and we will readdress this after completion of the aforementioned groin ultrasound.  Hypertension Blood pressure is on the low side today, though the patient is asymptomatic. His home readings have been higher. We will therefore not make any changes today. If his cough persists, we may need to consider switching lisinopril to an ARB.  Hyperlipidemia Patient is tolerating high intensity statin therapy well. LDL last month was 61. We will not make any changes at this time.  Follow-up: Return to clinic in 3 months  Yvonne Kendallhristopher Latresa Gasser, MD 01/03/2017 9:01 AM

## 2017-01-03 NOTE — Patient Instructions (Signed)
Medication Instructions:  Your physician recommends that you continue on your current medications as directed. Please refer to the Current Medication list given to you today.   Labwork: None   Testing/Procedures: Keep the appointment you already have scheduled for the ultrasound of your right groin. [facility].  Follow-Up: Your physician recommends that you schedule a follow-up appointment in: 3 months with Dr End.        If you need a refill on your cardiac medications before your next appointment, please call your pharmacy.

## 2017-01-30 ENCOUNTER — Ambulatory Visit (HOSPITAL_COMMUNITY)
Admission: RE | Admit: 2017-01-30 | Discharge: 2017-01-30 | Disposition: A | Discharge status: Home / Self Care | Payer: Self-pay | Source: Ambulatory Visit | Attending: Cardiovascular Disease | Admitting: Cardiovascular Disease

## 2017-01-30 DIAGNOSIS — I10 Essential (primary) hypertension: Secondary | ICD-10-CM | POA: Insufficient documentation

## 2017-01-30 DIAGNOSIS — Z87891 Personal history of nicotine dependence: Secondary | ICD-10-CM | POA: Insufficient documentation

## 2017-01-30 DIAGNOSIS — E785 Hyperlipidemia, unspecified: Secondary | ICD-10-CM | POA: Insufficient documentation

## 2017-01-30 DIAGNOSIS — I728 Aneurysm of other specified arteries: Secondary | ICD-10-CM

## 2017-01-30 DIAGNOSIS — T81719A Complication of unspecified artery following a procedure, not elsewhere classified, initial encounter: Secondary | ICD-10-CM

## 2017-01-30 DIAGNOSIS — I739 Peripheral vascular disease, unspecified: Secondary | ICD-10-CM | POA: Insufficient documentation

## 2017-01-30 DIAGNOSIS — I2510 Atherosclerotic heart disease of native coronary artery without angina pectoris: Secondary | ICD-10-CM | POA: Insufficient documentation

## 2017-01-30 DIAGNOSIS — IMO0001 Reserved for inherently not codable concepts without codable children: Secondary | ICD-10-CM

## 2017-01-30 DIAGNOSIS — Y848 Other medical procedures as the cause of abnormal reaction of the patient, or of later complication, without mention of misadventure at the time of the procedure: Secondary | ICD-10-CM | POA: Insufficient documentation

## 2017-02-01 ENCOUNTER — Telehealth: Payer: Self-pay | Admitting: Internal Medicine

## 2017-02-01 MED ORDER — WARFARIN SODIUM 5 MG PO TABS: 5.0000 mg | ORAL_TABLET | Freq: Every day | ORAL | 0 refills | Status: DC

## 2017-02-01 NOTE — Telephone Encounter (Signed)
°  Follow Up   Pt returning call regarding most recent test results. Please call.

## 2017-02-01 NOTE — Telephone Encounter (Signed)
I spoke with the patient re: results of ultrasound showing resolution of pseudoaneurysm. We will refer him to cardiac rehab. We also discussed anticoagulation and have agreed to starting warfarin 5 mg daily with f/u in the anticoagulation clinic next week. He is hesitant to try a NOAC due to lack of insurance and financial constraints. Once his INR is therapeutic, we can stop aspirin and continue warfarin + clopidogrel to complete 12 months of therapy following PCI.  Yvonne Kendallhristopher Darriona Dehaas, MD [facility] Pager: [phone]

## 2017-02-01 NOTE — Telephone Encounter (Signed)
[facility] appt scheduled for 02/06/17, pt aware.  I spoke with Carlette at Cardiac Rehab, they will contact pt to schedule orientation.

## 2017-02-01 NOTE — Telephone Encounter (Signed)
Pt states he  is in the process of applying [facility], he will let me know when he hears decision from [facility]  about financial assistance.

## 2017-02-01 NOTE — Telephone Encounter (Signed)
Left message on patient's voice mail to [facility] to discuss results of recent right groin ultrasound demonstrating resolution of pseudoaneurysm. We will discuss starting anticoagulation as well as referral to cardiac rehabilitation.  Yvonne Kendallhristopher Linley Moskal, MD [facility] Pager: [phone]

## 2017-02-06 ENCOUNTER — Ambulatory Visit (INDEPENDENT_AMBULATORY_CARE_PROVIDER_SITE_OTHER): Payer: Self-pay | Admitting: *Deleted

## 2017-02-06 DIAGNOSIS — I480 Paroxysmal atrial fibrillation: Secondary | ICD-10-CM

## 2017-02-06 DIAGNOSIS — Z5181 Encounter for therapeutic drug level monitoring: Secondary | ICD-10-CM

## 2017-02-06 LAB — POCT INR: INR: 1.5

## 2017-02-06 NOTE — Patient Instructions (Addendum)

## 2017-02-14 ENCOUNTER — Ambulatory Visit (INDEPENDENT_AMBULATORY_CARE_PROVIDER_SITE_OTHER): Payer: Self-pay | Admitting: *Deleted

## 2017-02-14 DIAGNOSIS — I480 Paroxysmal atrial fibrillation: Secondary | ICD-10-CM

## 2017-02-14 DIAGNOSIS — Z5181 Encounter for therapeutic drug level monitoring: Secondary | ICD-10-CM

## 2017-02-14 LAB — PROTIME-INR
INR: 6.9 — AB (ref 0.8–1.2)
Prothrombin Time: 68 s — ABNORMAL HIGH (ref 9.1–12.0)

## 2017-02-14 LAB — POCT INR: INR: 7.5

## 2017-02-18 ENCOUNTER — Ambulatory Visit (INDEPENDENT_AMBULATORY_CARE_PROVIDER_SITE_OTHER): Payer: Self-pay | Admitting: *Deleted

## 2017-02-18 DIAGNOSIS — Z5181 Encounter for therapeutic drug level monitoring: Secondary | ICD-10-CM

## 2017-02-18 DIAGNOSIS — I480 Paroxysmal atrial fibrillation: Secondary | ICD-10-CM

## 2017-02-18 LAB — POCT INR: INR: 1.2

## 2017-02-26 ENCOUNTER — Ambulatory Visit (INDEPENDENT_AMBULATORY_CARE_PROVIDER_SITE_OTHER): Payer: Self-pay | Admitting: *Deleted

## 2017-02-26 DIAGNOSIS — Z5181 Encounter for therapeutic drug level monitoring: Secondary | ICD-10-CM

## 2017-02-26 DIAGNOSIS — I480 Paroxysmal atrial fibrillation: Secondary | ICD-10-CM

## 2017-02-26 LAB — POCT INR: INR: 2.3

## 2017-03-06 ENCOUNTER — Ambulatory Visit (INDEPENDENT_AMBULATORY_CARE_PROVIDER_SITE_OTHER): Payer: Self-pay | Admitting: *Deleted

## 2017-03-06 DIAGNOSIS — I480 Paroxysmal atrial fibrillation: Secondary | ICD-10-CM

## 2017-03-06 DIAGNOSIS — Z5181 Encounter for therapeutic drug level monitoring: Secondary | ICD-10-CM

## 2017-03-06 LAB — POCT INR: INR: 1.8

## 2017-03-11 ENCOUNTER — Other Ambulatory Visit: Payer: Self-pay | Admitting: *Deleted

## 2017-03-11 MED ORDER — WARFARIN SODIUM 5 MG PO TABS: 5.0000 mg | ORAL_TABLET | Freq: Every day | ORAL | 2 refills | Status: DC

## 2017-03-12 ENCOUNTER — Ambulatory Visit (INDEPENDENT_AMBULATORY_CARE_PROVIDER_SITE_OTHER): Payer: Self-pay | Admitting: *Deleted

## 2017-03-12 DIAGNOSIS — Z5181 Encounter for therapeutic drug level monitoring: Secondary | ICD-10-CM

## 2017-03-12 DIAGNOSIS — I480 Paroxysmal atrial fibrillation: Secondary | ICD-10-CM

## 2017-03-12 LAB — POCT INR: INR: 2.2

## 2017-03-20 ENCOUNTER — Ambulatory Visit (INDEPENDENT_AMBULATORY_CARE_PROVIDER_SITE_OTHER): Payer: Self-pay | Admitting: Pharmacist

## 2017-03-20 DIAGNOSIS — I480 Paroxysmal atrial fibrillation: Secondary | ICD-10-CM

## 2017-03-20 DIAGNOSIS — Z5181 Encounter for therapeutic drug level monitoring: Secondary | ICD-10-CM

## 2017-03-20 LAB — POCT INR: INR: 2.6

## 2017-03-22 ENCOUNTER — Telehealth: Payer: Self-pay | Admitting: Internal Medicine

## 2017-03-22 NOTE — Telephone Encounter (Signed)
LM to follow up

## 2017-03-22 NOTE — Telephone Encounter (Signed)
New message   Pt verbalized that he is getting bruises and wants a call

## 2017-03-25 NOTE — Telephone Encounter (Signed)
LMOM again. 

## 2017-03-26 NOTE — Telephone Encounter (Signed)
Have LMOM x3. Will await pt's call back, otherwise plan to f/u with INR appt tomorrow as scheduled.

## 2017-03-27 ENCOUNTER — Ambulatory Visit (INDEPENDENT_AMBULATORY_CARE_PROVIDER_SITE_OTHER): Payer: Self-pay | Admitting: *Deleted

## 2017-03-27 DIAGNOSIS — I480 Paroxysmal atrial fibrillation: Secondary | ICD-10-CM

## 2017-03-27 DIAGNOSIS — Z5181 Encounter for therapeutic drug level monitoring: Secondary | ICD-10-CM

## 2017-03-27 LAB — POCT INR: INR: 2.2

## 2017-04-05 ENCOUNTER — Ambulatory Visit (INDEPENDENT_AMBULATORY_CARE_PROVIDER_SITE_OTHER): Payer: Self-pay | Admitting: *Deleted

## 2017-04-05 ENCOUNTER — Encounter: Payer: Self-pay | Admitting: Internal Medicine

## 2017-04-05 ENCOUNTER — Ambulatory Visit (INDEPENDENT_AMBULATORY_CARE_PROVIDER_SITE_OTHER): Payer: Self-pay | Admitting: Internal Medicine

## 2017-04-05 VITALS — BP 118/74 | HR 72 | Ht 68.0 in | Wt 179.0 lb

## 2017-04-05 DIAGNOSIS — I2510 Atherosclerotic heart disease of native coronary artery without angina pectoris: Secondary | ICD-10-CM

## 2017-04-05 DIAGNOSIS — I480 Paroxysmal atrial fibrillation: Secondary | ICD-10-CM

## 2017-04-05 DIAGNOSIS — H9312 Tinnitus, left ear: Secondary | ICD-10-CM

## 2017-04-05 DIAGNOSIS — E785 Hyperlipidemia, unspecified: Secondary | ICD-10-CM

## 2017-04-05 DIAGNOSIS — I729 Aneurysm of unspecified site: Secondary | ICD-10-CM

## 2017-04-05 DIAGNOSIS — Z5181 Encounter for therapeutic drug level monitoring: Secondary | ICD-10-CM

## 2017-04-05 DIAGNOSIS — I10 Essential (primary) hypertension: Secondary | ICD-10-CM

## 2017-04-05 LAB — POCT INR: INR: 2.9

## 2017-04-05 MED ORDER — METOPROLOL TARTRATE 25 MG PO TABS: 12.5000 mg | ORAL_TABLET | Freq: Two times a day (BID) | ORAL | 3 refills | Status: DC

## 2017-04-05 NOTE — Progress Notes (Signed)
Follow-up Outpatient Visit Date: 04/05/2017  Primary Care Provider: Lorenda Ishihara, MD [address] [place] [address]  Chief Complaint: Follow-up coronary artery disease and paroxysmal atrial fibrillation  HPI:  [name] is a 61 y.o. year-old male with history of paroxysmal atrial fibrillation, coronary artery disease status post PCI to the mid RCA in 11/2016 complicated by right groin pseudoaneurysm, right subclavian artery stenosis, hypertension, and hyperlipidemia, who presents for follow-up of coronary artery disease and paroxysmal atrial fibrillation. I last saw him in 12/2016, at which time he was recovering well from his preceding PCI complicated by right groin pseudoaneurysm. Today, [name] is doing well. He has not had any chest pian, shortness of breath, lightheadedness, orthopnea, or PND. He has rare palpitations lasting only a few seconds, which are not what he experienced in the past with his PAF. He is currently on warfarin, clopidogrel and aspirin. He initially notes some epistaxis and easy bruising when he began the warfarin, though this seems to be getting better. Of note, his INR was quite supratherapeutic in early April. He also feels as though the warfarin has given him acne. He is currently taking metoprolol 25 mg every morning (rather than BID as prescribed).  --------------------------------------------------------------------------------------------------  Cardiovascular History & Procedures: Cardiovascular Problems:  Coronary artery disease status post PCI to mid RCA 11/2016  Paroxysmal atrial fibrillation  Right subclavian artery stenosis  Risk Factors:  Known coronary artery disease and peripheral vascular disease, hypertension, hyperlipidemia, male gender, and age > 49  Cath/PCI:  LHC/PCI (12/10/16): Significant two-vessel CAD including 95% mid RCA stenosis and chronic total occlusion of small apical LAD. Mild diffuse disease  involving LAD and LCx also evident. Left ventricular filling pressure and contraction normal. Successful IVUS guided PCI to mid RCA with placement of a Xience Alpine 3.5 x 28 mm drug-eluting stent postdilated to 4.5 mm.  CV Surgery:  None  EP Procedures and Devices:  None  Non-Invasive Evaluation(s):  Right groin ultrasound (12/19/16): Pseudoaneurysm arising from the right mid common femoral artery measuring 2.5 x 2.1 x 1.1 cm. Pseudoaneurysm is largely thrombosed of some arterial flow is seen. Patent right iliac, CFA, and proximal SFA/PFA.  Transthoracic echocardiogram (11/10/15): Normal LV size and function (EF 55-60%). Mild MR. Normal RV size and function.  Recent CV Pertinent Labs: Lab Results  Component Value Date   CHOL 154 12/05/2016   HDL 41 12/05/2016   LDLCALC 61 12/05/2016   LDLDIRECT 120.9 10/04/2008   TRIG 259 (H) 12/05/2016   CHOLHDL 3.8 12/05/2016   CHOLHDL 5.1 CALC 10/04/2008   INR 2.2 03/27/2017   INR 6.9 (>) 02/14/2017   K 3.6 12/11/2016   BUN 8 12/11/2016   BUN 13 12/05/2016   CREATININE 0.70 12/11/2016   CREATININE 0.88 10/21/2015    Past medical and surgical history were reviewed and updated in EPIC.  Outpatient Encounter Prescriptions as of 04/05/2017  Medication Sig  . aspirin 81 MG chewable tablet Chew 1 tablet (81 mg total) by mouth daily.  [name] atorvastatin (LIPITOR) 40 MG tablet Take 1 tablet (40 mg total) by mouth daily at 6 PM.  . Cholecalciferol (VITAMIN D3) 1000 units CAPS Take 1,000 Units by mouth daily.  . clopidogrel (PLAVIX) 75 MG tablet Take 1 tablet (75 mg total) by mouth daily with breakfast.  . lisinopril (PRINIVIL,ZESTRIL) 10 MG tablet Take 1 tablet (10 mg total) by mouth daily.  . metoprolol tartrate (LOPRESSOR) 25 MG tablet Take 1 tablet (25 mg total) by mouth  2 (two) times daily.  . nitroGLYCERIN (NITROSTAT) 0.4 MG SL tablet Place 1 tablet (0.4 mg total) under the tongue every 5 (five) minutes as needed for chest pain.  . ranitidine  (ZANTAC) 150 MG tablet Take 150 mg by mouth daily as needed for heartburn.  . warfarin (COUMADIN) 5 MG tablet Take 1 tablet (5 mg total) by mouth daily.   No facility-administered encounter medications on file as of 04/05/2017.     Allergies: Tetanus toxoid  Social History   Social History  . Marital status: Divorced    Spouse name: N/A  . Number of children: N/A  . Years of education: N/A   Occupational History  . 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    [employer]   Social History Main Topics  . Smoking status: Former Smoker    Packs/day: 0.50    Years: 27.00    Types: Cigarettes    Quit date: 12/07/2016  . Smokeless tobacco: Never Used     Comment: one week ago  . Alcohol use Yes     Comment: 12/10/2016 "if I have a beer once/month that's alot"  . Drug use: No  . Sexual activity: Not Currently   Other Topics Concern  . Not on file   Social History Narrative  . No narrative on file    Family History  Problem Relation Age of Onset  . Heart disease Father        After age 60  . Heart attack Father   . Cancer Brother   . Stroke Brother     Review of Systems: [name] has noted ringing in the left ear and intermittent pulsatile sounds over the last week. No trauma to the head or neck. Otherwise, a 12-system review of systems was performed and was negative except as noted in the HPI.  --------------------------------------------------------------------------------------------------  Physical Exam: BP 118/74   Pulse 72   Ht 5\' 8"  (1.727 m)   Wt 179 lb (81.2 kg)   SpO2 98%   BMI 27.22 kg/m   General:  Well-developed, well-nourished man, seated comfortably on the exam table. HEENT: No conjunctival pallor or scleral icterus.  Moist mucous membranes.  OP clear. Neck: Supple without lymphadenopathy, thyromegaly, JVD, or HJR.  No carotid bruit. Lungs: Normal work of breathing.  Clear to auscultation bilaterally without wheezes or crackles. Heart: Regular rate and rhythm  without murmurs, rubs, or gallops.  Non-displaced PMI. Abd: Bowel sounds present.  Soft, NT/ND without hepatosplenomegaly Ext: No lower extremity edema.  Radial, PT, and DP pulses are 2+ bilaterally. Skin: Warm and dry without rash.  EKG:  Normal sinus rhythm without significant abnormalities.  Lab Results  Component Value Date   WBC 8.4 12/11/2016   HGB 12.5 (L) 12/11/2016   HCT 36.0 (L) 12/11/2016   MCV 85.3 12/11/2016   PLT 161 12/11/2016    Lab Results  Component Value Date   NA 137 12/11/2016   K 3.6 12/11/2016   CL 112 (H) 12/11/2016   CO2 20 (L) 12/11/2016   BUN 8 12/11/2016   CREATININE 0.70 12/11/2016   GLUCOSE 100 (H) 12/11/2016   ALT 13 12/05/2016    Lab Results  Component Value Date   CHOL 154 12/05/2016   HDL 41 12/05/2016   LDLCALC 61 12/05/2016   LDLDIRECT 120.9 10/04/2008   TRIG 259 (H) 12/05/2016   CHOLHDL 3.8 12/05/2016    --------------------------------------------------------------------------------------------------  ASSESSMENT AND PLAN: Coronary artery disease without angina No further chest pain since our last visit.  Mr. Notz has recovered well from PCI to the mid RCA in 11/2016. We wil continue his current medications for secondary prevention. Given that he is now on warfarin with a therapeutic INR, we will stop aspirin and continue warfarin and clopidogrel from 12 months from the time of stent implantation.  Right groin pseudoaneurysm No pain or swelling at the site. Repeat Duplex study showed spontaneous closure of the pseudoaneurysm. No further interventions planned.  Paroxysmal atrial fibrillation No symptoms to suggest recurrence. EKG today demonstrates NSR. We will switch metoprolol to 12.5 mg BID (as he has been taking 25 mg daily). In the setting of CHADSVASC score of 2 (HTN and CAD), Mr. Lazar will need to remain on indefinite anticoagulation (currently on warfarin followed by our anticoagulation clinic).  Hypertension Blood  pressure is well-controlled today. As above, we will switch metoprolol to 12.5 mg BID.  Hyperlipidemia LDL at goal in 11/2016. Continue with high-intensity statin.  Tinnitus New over the last week. Mr. Vanpatten describes both a ringing and intermittent pulsatile sound. Exam is unremarkable today. We will stop aspirin, as above. If symptoms persist, we will need to consider carotid Doppler study and/or referral to ENT for further evaluation.  Follow-up: Return to clinic in 6 months  Yvonne Kendall, MD 04/05/2017 8:52 AM

## 2017-04-05 NOTE — Patient Instructions (Addendum)
Medication Instructions:  Your physician has recommended you make the following change in your medication:  1.  STOP the Aspirin 2.  TAKE the Metoprolol 25 mg 1/2 tablet twice a day   Labwork: None ordered  Testing/Procedures: None ordered  Follow-Up: Your physician wants you to follow-up in: 6 MONTHS WITH DR. END   You will receive a reminder letter in the mail two months in advance. If you don't receive a letter, please call our office to schedule the follow-up appointment.     Any Other Special Instructions Will Be Listed Below (If Applicable).     If you need a refill on your cardiac medications before your next appointment, please call your pharmacy.

## 2017-04-19 ENCOUNTER — Ambulatory Visit (INDEPENDENT_AMBULATORY_CARE_PROVIDER_SITE_OTHER): Payer: Self-pay | Admitting: Pharmacist

## 2017-04-19 DIAGNOSIS — I480 Paroxysmal atrial fibrillation: Secondary | ICD-10-CM

## 2017-04-19 DIAGNOSIS — Z5181 Encounter for therapeutic drug level monitoring: Secondary | ICD-10-CM

## 2017-04-19 LAB — POCT INR: INR: 2.5

## 2017-05-17 ENCOUNTER — Ambulatory Visit (INDEPENDENT_AMBULATORY_CARE_PROVIDER_SITE_OTHER): Payer: Self-pay | Admitting: *Deleted

## 2017-05-17 DIAGNOSIS — Z5181 Encounter for therapeutic drug level monitoring: Secondary | ICD-10-CM

## 2017-05-17 DIAGNOSIS — I480 Paroxysmal atrial fibrillation: Secondary | ICD-10-CM

## 2017-05-17 LAB — POCT INR: INR: 3.7

## 2017-06-04 ENCOUNTER — Ambulatory Visit (INDEPENDENT_AMBULATORY_CARE_PROVIDER_SITE_OTHER): Payer: Self-pay | Admitting: Pharmacist

## 2017-06-04 DIAGNOSIS — Z5181 Encounter for therapeutic drug level monitoring: Secondary | ICD-10-CM

## 2017-06-04 DIAGNOSIS — I480 Paroxysmal atrial fibrillation: Secondary | ICD-10-CM

## 2017-06-04 LAB — POCT INR: INR: 4.2

## 2017-06-27 ENCOUNTER — Other Ambulatory Visit: Payer: Self-pay | Admitting: Internal Medicine

## 2017-06-28 NOTE — Telephone Encounter (Signed)
Refill Request.  

## 2017-07-13 ENCOUNTER — Other Ambulatory Visit: Payer: Self-pay | Admitting: Cardiology

## 2017-07-23 ENCOUNTER — Other Ambulatory Visit: Payer: Self-pay

## 2017-07-23 MED ORDER — WARFARIN SODIUM 5 MG PO TABS: 5.0000 mg | ORAL_TABLET | Freq: Every day | ORAL | 0 refills | Status: DC

## 2017-07-23 NOTE — Telephone Encounter (Signed)
Pt walked into clinic states he has been out of Warfarin x approx 1 month and overdue for follow-up.  Sent in a 1 month supply of Warfarin to [facility] in [place] and made pt follow-up appt for Tuesday 07/30/17 at 8:15am.  Advised pt he must keep appt for any future refills.

## 2017-07-30 ENCOUNTER — Ambulatory Visit (INDEPENDENT_AMBULATORY_CARE_PROVIDER_SITE_OTHER): Payer: Self-pay

## 2017-07-30 DIAGNOSIS — Z5181 Encounter for therapeutic drug level monitoring: Secondary | ICD-10-CM

## 2017-07-30 DIAGNOSIS — I480 Paroxysmal atrial fibrillation: Secondary | ICD-10-CM

## 2017-07-30 LAB — POCT INR: INR: 1.8

## 2017-07-30 MED ORDER — WARFARIN SODIUM 5 MG PO TABS: ORAL_TABLET | ORAL | 3 refills | Status: DC

## 2017-08-16 ENCOUNTER — Ambulatory Visit (INDEPENDENT_AMBULATORY_CARE_PROVIDER_SITE_OTHER): Payer: Self-pay

## 2017-08-16 DIAGNOSIS — I480 Paroxysmal atrial fibrillation: Secondary | ICD-10-CM

## 2017-08-16 DIAGNOSIS — Z5181 Encounter for therapeutic drug level monitoring: Secondary | ICD-10-CM

## 2017-08-16 LAB — POCT INR: INR: 3.8

## 2017-08-30 ENCOUNTER — Ambulatory Visit (INDEPENDENT_AMBULATORY_CARE_PROVIDER_SITE_OTHER): Payer: Self-pay | Admitting: *Deleted

## 2017-08-30 DIAGNOSIS — I480 Paroxysmal atrial fibrillation: Secondary | ICD-10-CM

## 2017-08-30 DIAGNOSIS — Z5181 Encounter for therapeutic drug level monitoring: Secondary | ICD-10-CM

## 2017-08-30 LAB — POCT INR: INR: 3.1

## 2017-09-13 ENCOUNTER — Ambulatory Visit (INDEPENDENT_AMBULATORY_CARE_PROVIDER_SITE_OTHER): Payer: Self-pay | Admitting: *Deleted

## 2017-09-13 DIAGNOSIS — I480 Paroxysmal atrial fibrillation: Secondary | ICD-10-CM

## 2017-09-13 DIAGNOSIS — Z5181 Encounter for therapeutic drug level monitoring: Secondary | ICD-10-CM

## 2017-09-13 LAB — POCT INR: INR: 1.6

## 2017-09-27 ENCOUNTER — Ambulatory Visit (INDEPENDENT_AMBULATORY_CARE_PROVIDER_SITE_OTHER): Payer: Self-pay | Admitting: *Deleted

## 2017-09-27 DIAGNOSIS — I480 Paroxysmal atrial fibrillation: Secondary | ICD-10-CM

## 2017-09-27 DIAGNOSIS — Z5181 Encounter for therapeutic drug level monitoring: Secondary | ICD-10-CM

## 2017-09-27 LAB — POCT INR: INR: 2.2

## 2017-09-27 NOTE — Patient Instructions (Signed)
Continue taking 1 tablet daily except 1/2 tablet on Tuesdays, Thursdays, and Saturdays. Recheck in 3 weeks. Main # [phone] [facility]# [phone]

## 2017-10-11 ENCOUNTER — Encounter: Payer: Self-pay | Admitting: Gastroenterology

## 2017-10-18 ENCOUNTER — Ambulatory Visit (INDEPENDENT_AMBULATORY_CARE_PROVIDER_SITE_OTHER): Payer: Self-pay | Admitting: *Deleted

## 2017-10-18 DIAGNOSIS — Z5181 Encounter for therapeutic drug level monitoring: Secondary | ICD-10-CM

## 2017-10-18 DIAGNOSIS — I480 Paroxysmal atrial fibrillation: Secondary | ICD-10-CM

## 2017-10-18 LAB — POCT INR: INR: 2

## 2017-10-18 NOTE — Patient Instructions (Signed)
Continue taking 1 tablet daily except 1/2 tablet on Tuesdays, Thursdays, and Saturdays. Recheck in 4 weeks. Main # [phone] [facility]# [phone]

## 2017-11-02 ENCOUNTER — Other Ambulatory Visit: Payer: Self-pay | Admitting: Internal Medicine

## 2017-11-02 DIAGNOSIS — E785 Hyperlipidemia, unspecified: Secondary | ICD-10-CM

## 2017-11-02 DIAGNOSIS — I10 Essential (primary) hypertension: Secondary | ICD-10-CM

## 2017-11-15 ENCOUNTER — Ambulatory Visit (INDEPENDENT_AMBULATORY_CARE_PROVIDER_SITE_OTHER): Payer: Self-pay | Admitting: *Deleted

## 2017-11-15 DIAGNOSIS — I480 Paroxysmal atrial fibrillation: Secondary | ICD-10-CM

## 2017-11-15 DIAGNOSIS — Z5181 Encounter for therapeutic drug level monitoring: Secondary | ICD-10-CM

## 2017-11-15 LAB — POCT INR: INR: 1.5

## 2017-11-15 NOTE — Patient Instructions (Signed)
Description   Today take 1.5 tablets and tomorrow take 1 tablet then continue taking 1 tablet daily except 1/2 tablet on Tuesdays, Thursdays, and Saturdays. Recheck in 1 week. Main # [phone] [facility]# [phone]

## 2017-11-22 ENCOUNTER — Ambulatory Visit (INDEPENDENT_AMBULATORY_CARE_PROVIDER_SITE_OTHER): Payer: Self-pay | Admitting: Pharmacist

## 2017-11-22 DIAGNOSIS — Z5181 Encounter for therapeutic drug level monitoring: Secondary | ICD-10-CM

## 2017-11-22 DIAGNOSIS — I480 Paroxysmal atrial fibrillation: Secondary | ICD-10-CM

## 2017-11-22 LAB — POCT INR: INR: 2.4

## 2017-11-22 NOTE — Patient Instructions (Signed)
Description   Continue taking 1 tablet daily except 1/2 tablet on Tuesdays, Thursdays, and Saturdays. Recheck in 3 weeks. Main # [phone] [facility]# [phone]

## 2017-12-13 ENCOUNTER — Ambulatory Visit (INDEPENDENT_AMBULATORY_CARE_PROVIDER_SITE_OTHER): Payer: Self-pay | Admitting: Pharmacist

## 2017-12-13 DIAGNOSIS — Z5181 Encounter for therapeutic drug level monitoring: Secondary | ICD-10-CM

## 2017-12-13 DIAGNOSIS — I480 Paroxysmal atrial fibrillation: Secondary | ICD-10-CM

## 2017-12-13 LAB — POCT INR: INR: 2.1

## 2017-12-13 NOTE — Patient Instructions (Signed)
Description   Continue taking 1 tablet daily except 1/2 tablet on Tuesdays, Thursdays, and Saturdays. Recheck in 4 weeks. Main # [phone] [facility]# [phone]

## 2017-12-23 ENCOUNTER — Ambulatory Visit (INDEPENDENT_AMBULATORY_CARE_PROVIDER_SITE_OTHER): Payer: Self-pay | Admitting: Pharmacist

## 2017-12-23 DIAGNOSIS — I480 Paroxysmal atrial fibrillation: Secondary | ICD-10-CM

## 2017-12-23 DIAGNOSIS — E785 Hyperlipidemia, unspecified: Secondary | ICD-10-CM

## 2017-12-23 DIAGNOSIS — I10 Essential (primary) hypertension: Secondary | ICD-10-CM

## 2017-12-23 DIAGNOSIS — Z5181 Encounter for therapeutic drug level monitoring: Secondary | ICD-10-CM

## 2017-12-23 LAB — POCT INR: INR: 2.2

## 2017-12-23 MED ORDER — METOPROLOL TARTRATE 25 MG PO TABS: 12.5000 mg | ORAL_TABLET | Freq: Two times a day (BID) | ORAL | 3 refills | Status: DC

## 2017-12-23 NOTE — Patient Instructions (Signed)
Description   Today take 1.5 tablets then continue taking 1 tablet daily except 1/2 tablet on Tuesdays, Thursdays, and Saturdays. Recheck in 4 weeks. Main # [phone] [facility]# [phone]

## 2018-01-09 ENCOUNTER — Other Ambulatory Visit: Payer: Self-pay | Admitting: Internal Medicine

## 2018-01-09 ENCOUNTER — Other Ambulatory Visit: Payer: Self-pay | Admitting: Cardiology

## 2018-01-10 ENCOUNTER — Ambulatory Visit (INDEPENDENT_AMBULATORY_CARE_PROVIDER_SITE_OTHER): Payer: Self-pay | Admitting: *Deleted

## 2018-01-10 DIAGNOSIS — I480 Paroxysmal atrial fibrillation: Secondary | ICD-10-CM

## 2018-01-10 DIAGNOSIS — Z5181 Encounter for therapeutic drug level monitoring: Secondary | ICD-10-CM

## 2018-01-10 LAB — POCT INR: INR: 3

## 2018-01-10 NOTE — Patient Instructions (Addendum)
Description   Today March 1st take only 1/2 tablet (2.5mg )  then continue taking 1 tablet daily except 1/2 tablet on Tuesdays, Thursdays, and Saturdays. Recheck in 1 week as he is on Minocycline  100 mg daily times 30 days  with refills  and that can elevate INR . Main # [phone] [facility]# [phone]

## 2018-01-10 NOTE — Telephone Encounter (Signed)
Refill Request.  

## 2018-01-17 ENCOUNTER — Ambulatory Visit (INDEPENDENT_AMBULATORY_CARE_PROVIDER_SITE_OTHER): Payer: Self-pay | Admitting: *Deleted

## 2018-01-17 DIAGNOSIS — I480 Paroxysmal atrial fibrillation: Secondary | ICD-10-CM

## 2018-01-17 DIAGNOSIS — Z5181 Encounter for therapeutic drug level monitoring: Secondary | ICD-10-CM

## 2018-01-17 LAB — POCT INR: INR: 2.2

## 2018-01-17 NOTE — Patient Instructions (Addendum)
Description   Continue taking 1 tablet daily except 1/2 tablet on Tuesdays, Thursdays, and Saturdays. Recheck in 3 weeks per pt request.  Main # [phone] [facility]# [phone]

## 2018-01-30 ENCOUNTER — Other Ambulatory Visit: Payer: Self-pay | Admitting: Internal Medicine

## 2018-01-30 DIAGNOSIS — E785 Hyperlipidemia, unspecified: Secondary | ICD-10-CM

## 2018-01-30 DIAGNOSIS — I10 Essential (primary) hypertension: Secondary | ICD-10-CM

## 2018-01-31 ENCOUNTER — Other Ambulatory Visit: Payer: Self-pay

## 2018-01-31 DIAGNOSIS — I25110 Atherosclerotic heart disease of native coronary artery with unstable angina pectoris: Secondary | ICD-10-CM

## 2018-01-31 NOTE — Telephone Encounter (Signed)
Okay to change authorizing provider to Dr End? Please advise. Thanks, MI

## 2018-01-31 NOTE — Telephone Encounter (Signed)
It is fine to refill this in my name. However, he needs labs first. Please have him get a CMP, CBC, and lipid panel. Thanks.  Yvonne Kendallhristopher Jyles Sontag, MD [facility] Pager: [phone]

## 2018-01-31 NOTE — Telephone Encounter (Signed)
Please review for refill. Thanks!  

## 2018-01-31 NOTE — Telephone Encounter (Signed)
Is it okay to refill patient's Lisinopril and make you authorized ordering provider as he has seen you in the past..  I can forward to scheduling and get an appt made since the 6 month f/u was not scheduled.. Thank you

## 2018-02-04 NOTE — Telephone Encounter (Signed)
lpmtcb 3/26 md

## 2018-02-05 ENCOUNTER — Other Ambulatory Visit: Payer: Self-pay | Admitting: Internal Medicine

## 2018-02-05 DIAGNOSIS — E785 Hyperlipidemia, unspecified: Secondary | ICD-10-CM

## 2018-02-05 DIAGNOSIS — I10 Essential (primary) hypertension: Secondary | ICD-10-CM

## 2018-02-05 MED ORDER — LISINOPRIL 10 MG PO TABS: 10.0000 mg | ORAL_TABLET | Freq: Every day | ORAL | 1 refills | Status: DC

## 2018-02-06 ENCOUNTER — Other Ambulatory Visit: Payer: Self-pay

## 2018-02-07 ENCOUNTER — Other Ambulatory Visit: Payer: Self-pay | Admitting: *Deleted

## 2018-02-07 ENCOUNTER — Ambulatory Visit (INDEPENDENT_AMBULATORY_CARE_PROVIDER_SITE_OTHER): Payer: Self-pay | Admitting: *Deleted

## 2018-02-07 DIAGNOSIS — I480 Paroxysmal atrial fibrillation: Secondary | ICD-10-CM

## 2018-02-07 DIAGNOSIS — Z5181 Encounter for therapeutic drug level monitoring: Secondary | ICD-10-CM

## 2018-02-07 DIAGNOSIS — I25110 Atherosclerotic heart disease of native coronary artery with unstable angina pectoris: Secondary | ICD-10-CM

## 2018-02-07 LAB — CBC
HEMOGLOBIN: 14.5 g/dL (ref 13.0–17.7)
Hematocrit: 43.3 % (ref 37.5–51.0)
MCH: 29.6 pg (ref 26.6–33.0)
MCHC: 33.5 g/dL (ref 31.5–35.7)
MCV: 88 fL (ref 79–97)
Platelets: 264 10*3/uL (ref 150–379)
RBC: 4.9 x10E6/uL (ref 4.14–5.80)
RDW: 13.8 % (ref 12.3–15.4)
WBC: 11.2 10*3/uL — AB (ref 3.4–10.8)

## 2018-02-07 LAB — LIPID PANEL
CHOLESTEROL TOTAL: 106 mg/dL (ref 100–199)
Chol/HDL Ratio: 2.5 ratio (ref 0.0–5.0)
HDL: 42 mg/dL (ref >39)
LDL Calculated: 41 mg/dL (ref 0–99)
Triglycerides: 114 mg/dL (ref 0–149)
VLDL CHOLESTEROL CAL: 23 mg/dL (ref 5–40)

## 2018-02-07 LAB — COMPREHENSIVE METABOLIC PANEL
A/G RATIO: 2 (ref 1.2–2.2)
ALBUMIN: 4.3 g/dL (ref 3.6–4.8)
ALT: 16 IU/L (ref 0–44)
AST: 22 IU/L (ref 0–40)
Alkaline Phosphatase: 81 IU/L (ref 39–117)
BUN / CREAT RATIO: 13 (ref 10–24)
BUN: 11 mg/dL (ref 8–27)
Bilirubin Total: 0.5 mg/dL (ref 0.0–1.2)
CALCIUM: 9.2 mg/dL (ref 8.6–10.2)
CO2: 24 mmol/L (ref 20–29)
Chloride: 104 mmol/L (ref 96–106)
Creatinine, Ser: 0.82 mg/dL (ref 0.76–1.27)
GFR, EST AFRICAN AMERICAN: 111 mL/min/{1.73_m2} (ref >59)
GFR, EST NON AFRICAN AMERICAN: 96 mL/min/{1.73_m2} (ref >59)
Globulin, Total: 2.1 g/dL (ref 1.5–4.5)
Glucose: 102 mg/dL — ABNORMAL HIGH (ref 65–99)
Potassium: 4 mmol/L (ref 3.5–5.2)
Sodium: 139 mmol/L (ref 134–144)
TOTAL PROTEIN: 6.4 g/dL (ref 6.0–8.5)

## 2018-02-07 LAB — POCT INR: INR: 2.5

## 2018-02-07 NOTE — Patient Instructions (Signed)
Description   Continue taking 1 tablet daily except 1/2 tablet on Tuesdays, Thursdays, and Saturdays. Recheck in 4 weeks .  Main # [phone] [facility]# [phone]

## 2018-02-13 ENCOUNTER — Other Ambulatory Visit: Payer: Self-pay

## 2018-02-13 DIAGNOSIS — E785 Hyperlipidemia, unspecified: Secondary | ICD-10-CM

## 2018-02-13 DIAGNOSIS — I10 Essential (primary) hypertension: Secondary | ICD-10-CM

## 2018-02-13 MED ORDER — METOPROLOL TARTRATE 25 MG PO TABS: 12.5000 mg | ORAL_TABLET | Freq: Two times a day (BID) | ORAL | 3 refills | Status: DC

## 2018-02-14 ENCOUNTER — Other Ambulatory Visit: Payer: Self-pay | Admitting: Internal Medicine

## 2018-02-14 DIAGNOSIS — E785 Hyperlipidemia, unspecified: Secondary | ICD-10-CM

## 2018-02-14 DIAGNOSIS — I10 Essential (primary) hypertension: Secondary | ICD-10-CM

## 2018-02-15 ENCOUNTER — Other Ambulatory Visit: Payer: Self-pay | Admitting: Internal Medicine

## 2018-02-17 ENCOUNTER — Other Ambulatory Visit: Payer: Self-pay | Admitting: Internal Medicine

## 2018-02-17 DIAGNOSIS — E785 Hyperlipidemia, unspecified: Secondary | ICD-10-CM

## 2018-02-17 DIAGNOSIS — I10 Essential (primary) hypertension: Secondary | ICD-10-CM

## 2018-02-17 MED ORDER — METOPROLOL TARTRATE 25 MG PO TABS: 12.5000 mg | ORAL_TABLET | Freq: Two times a day (BID) | ORAL | 0 refills | Status: DC

## 2018-02-17 NOTE — Telephone Encounter (Signed)
Please review for refill. Thanks!  

## 2018-03-02 NOTE — Progress Notes (Signed)
Cardiology Office Note    Date:  03/03/2018   ID:  Martin BeachRobert T Lesch, DOB 04/28/1956, MRN [id number]  PCP:  Lorenda IshiharaVaradarajan, Rupashree, MD  Cardiologist:  Dr. Okey DupreEnd   Chief Complaint: 12 Months follow up  History of Present Illness:   Martin BeachRobert T Shields is a 62 y.o. male CAD, PAF, Carotid artery disease, HTN, HLD and subclavian steal syndrome presents for follow up.   Hx of Coronary artery disease status post PCI to the mid RCA in 11/2016 complicated by right groin pseudoaneurysm. He was doing well on cardiac stand point when last seen by Dr. Okey DupreEnd 04/05/17.  Today for follow-up.  He was taking metoprolol 25 mg twice daily instead of 12.5mg  twice daily.  He find out during medication refill.  He then started taking 12.5mg  bid, however was noted chest discomfort and palpitation.  He is now taking 25 mg twice daily for 2 weeks without any recurrent symptoms.  He had one episode of angina about 2 months ago at rest that resolved with sublingual nitroglycerin x1 .  This was his first nitro after his PCI in 2018.  He did not require since then.  No regular exercise.  Denies shortness of breath, orthopnea PND, edema or melena.   Past Medical History:  Diagnosis Date  . Carotid artery occlusion    Right Carotid Bruit  . Chest pain 2003   neg evaluation  . Coronary artery disease    a. 12/10/16 PCI with DES--> RCA, normal EF  . History of kidney stones   . HTN (hypertension)   . Hyperlipemia   . Leg pain    a. ABIs (12/10):  Normal  . Paroxysmal atrial fibrillation (HCC)   . Subclavian steal syndrome Aug. 2015   Occult Right    Past Surgical History:  Procedure Laterality Date  . CARDIAC CATHETERIZATION N/A 12/10/2016   Procedure: Left Heart Cath and Coronary Angiography;  Surgeon: Yvonne Kendallhristopher End, MD;  [facility];  Service: Cardiovascular;  Laterality: N/A;  . CARDIAC CATHETERIZATION N/A 12/10/2016   Procedure: Coronary Stent Intervention;  Surgeon: Yvonne Kendallhristopher End, MD;   Location: [facility] CV [facility];  Service: Cardiovascular;  Laterality: N/A;  . CARDIAC CATHETERIZATION N/A 12/10/2016   Procedure: Intravascular Ultrasound/IVUS;  Surgeon: Yvonne Kendallhristopher End, MD;  Location: [facility] IN[facility]AS[facility] CV LAB;  Service: Cardiovascular;  Laterality: N/A;  . COLONOSCOPY  10/2007   negative results  . COLONOSCOPY W/ POLYPECTOMY  2003  . CORONARY ANGIOPLASTY WITH STENT PLACEMENT  12/10/2016  . CYSTECTOMY     removed from back  . epidural steroids     in the back x3  . JOINT REPLACEMENT Right February 29, 1976   KNEE  . REPAIR PATELLAR TENDON Right   . SHOULDER ARTHROSCOPY WITH OPEN ROTATOR CUFF REPAIR Right     Current Medications: Prior to Admission medications   Medication Sig Start Date End Date Taking? Authorizing Provider  atorvastatin (LIPITOR) 40 MG tablet TAKE 1 TABLET BY MOUTH ONCE DAILY AT 6:00PM 01/10/18   End, Cristal Deerhristopher, MD  Cholecalciferol (VITAMIN D3) 1000 units CAPS Take 1,000 Units by mouth daily.    [provider]  clopidogrel (PLAVIX) 75 MG tablet Take 1 tablet (75 mg total) by mouth daily. Please keep upcoming appt for future refills. Thank you 02/17/18   End, Cristal Deerhristopher, MD  lisinopril (PRINIVIL,ZESTRIL) 10 MG tablet TAKE 1 TABLET BY MOUTH ONCE DAILY.** NEEDS  AN  APPT  FOR  ADDITIONAL  REFILLS** 02/13/18   End, Cristal Deerhristopher, MD  lisinopril (PRINIVIL,ZESTRIL) 10 MG tablet Take 1 tablet (10 mg total) by mouth daily. Please make yearly appt with Dr. Okey Dupre for May before anymore refills. 1st attempt 02/05/18   End, Cristal Deer, MD  metoprolol tartrate (LOPRESSOR) 25 MG tablet Take 0.5 tablets (12.5 mg total) by mouth 2 (two) times daily. Please keep upcoming appt for future refills. Thank you 02/17/18   End, Cristal Deer, MD  nitroGLYCERIN (NITROSTAT) 0.4 MG SL tablet Place 1 tablet (0.4 mg total) under the tongue every 5 (five) minutes as needed for chest pain. 12/05/16 04/05/17  Allred, Fayrene Fearing, MD  ranitidine (ZANTAC) 150 MG tablet Take 150 mg by mouth daily as needed  for heartburn.    [provider]  warfarin (COUMADIN) 5 MG tablet TAKE AS DIRECTED BY THE COUMADIN CLINIC. 02/03/18   End, Cristal Deer, MD    Allergies:   Tetanus toxoid   Social History   Socioeconomic History  . Marital status: Divorced    Spouse name: Not on file  . Number of children: Not on file  . Years of education: Not on file  . Highest education level: Not on file  Occupational History  . Occupation: Investment banker, corporate: [employer]    Comment: Oceanographer  Social Needs  . Financial resource strain: Not on file  . Food insecurity:    Worry: Not on file    Inability: Not on file  . Transportation needs:    Medical: Not on file    Non-medical: Not on file  Tobacco Use  . Smoking status: Former Smoker    Packs/day: 0.50    Years: 27.00    Pack years: 13.50    Types: Cigarettes    Last attempt to quit: 12/07/2016    Years since quitting: 1.2  . Smokeless tobacco: Never Used  . Tobacco comment: one week ago  Substance and Sexual Activity  . Alcohol use: Yes    Comment: 12/10/2016 "if I have a beer once/month that's alot"  . Drug use: No  . Sexual activity: Not Currently  Lifestyle  . Physical activity:    Days per week: Not on file    Minutes per session: Not on file  . Stress: Not on file  Relationships  . Social connections:    Talks on phone: Not on file    Gets together: Not on file    Attends religious service: Not on file    Active member of club or organization: Not on file    Attends meetings of clubs or organizations: Not on file    Relationship status: Not on file  Other Topics Concern  . Not on file  Social History Narrative  . Not on file     Family History:  The patient's family history includes Cancer in his brother; Heart attack in his father; Heart disease in his father; Stroke in his brother.   ROS:   Please see the history of present illness.    ROS All other systems reviewed and are negative.   PHYSICAL EXAM:   VS:   BP 138/82   Pulse 62   Ht 5\' 8"  (1.727 m)   Wt 176 lb (79.8 kg)   BMI 26.76 kg/m    GEN: Well nourished, well developed, in no acute distress  HEENT: normal  Neck: no JVD, carotid bruits, or masses Cardiac: RRR; no murmurs, rubs, or gallops,no edema  Respiratory:  clear to auscultation bilaterally, normal work of breathing GI: soft, nontender, nondistended, + BS  MS: no deformity or atrophy  Skin: warm and dry, no rash Neuro:  Alert and Oriented x 3, Strength and sensation are intact Psych: euthymic mood, full affect  Wt Readings from Last 3 Encounters:  03/03/18 176 lb (79.8 kg)  04/05/17 179 lb (81.2 kg)  01/03/17 182 lb 9.6 oz (82.8 kg)      Studies/Labs Reviewed:   EKG:  EKG is ordered today.  The ekg ordered today demonstrates NSR with non specific TWI in inferior lead. No change.   Recent Labs: 02/07/2018: ALT 16; BUN 11; Creatinine, Ser 0.82; Hemoglobin 14.5; Platelets 264; Potassium 4.0; Sodium 139   Lipid Panel    Component Value Date/Time   CHOL 106 02/07/2018 0930   TRIG 114 02/07/2018 0930   HDL 42 02/07/2018 0930   CHOLHDL 2.5 02/07/2018 0930   CHOLHDL 5.1 CALC 10/04/2008 0847   VLDL 28 10/04/2008 0847   LDLCALC 41 02/07/2018 0930   LDLDIRECT 120.9 10/04/2008 0847    Additional studies/ records that were reviewed today include:   Echocardiogram:  11/10/15 Study Conclusions  - Left ventricle: The cavity size was normal. Systolic function was   normal. The estimated ejection fraction was in the range of 55%   to 60%. Wall motion was normal; there were no regional wall   motion abnormalities. Left ventricular diastolic function   parameters were normal. - Mitral valve: There was mild regurgitation. - Left atrium: The atrium was normal in size. - Right ventricle: Systolic function was normal. - Pulmonary arteries: Systolic pressure was within the normal   range.  Impressions:  - Normal study.  Coronary Stent Intervention  12/10/16    Intravascular Ultrasound/IVUS  Left Heart Cath and Coronary Angiography  Conclusion   Conclusions: 1. Significant 2 vessel coronary artery disease, including 95% midvessel stenosis of very large RCA, as well as chronic total occlusion of small apical LAD. 2. Mild diffuse disease involving LAD and LCx. 3. Normal left ventricular filling pressure. 4. Normal left ventricular contraction. 5. Successful IVUS-guided PCI of mid RCA with placement of Xience Alpine 3.5 x 28 mm drug-eluting stent (post-dilated with 4.5 mm non-compliant balloon) with 0% residual stenosis and TIMI-3 flow.  Recommendations: 1. Admit for overnight observation, given significant pain during and immediately after intervention. 2. Continue nitroglycerin infusion, to be weaned off as chest pain allows. 3. Continue tirofiban infusion for 6 hours. 4. Continue dual antiplatelet therapy with aspirin and clopidogrel for at least 6 months, ideally longer. 5. Aggressive secondary prevention, including escalation of statin therapy to atorvastatin 80 mg daily.     Carotid doppler 10/2015 Less than 40% stenosis bilaterally   ASSESSMENT & PLAN:    1. CAD - No recent agnina. Continue Plavix, statin and lopressor 25mg  BID. EKG without acute changes. Not on ASA due to need to anticoagulation.   2. HLD -02/07/2018: Cholesterol, Total 106; HDL 42; LDL Calculated 41; Triglycerides 114  - Continue Lipitor 40mg  qd.   3. PAF - Continue BB. Coumadin per pharmacy. No bleeding issue.   4. HTN - BP stable on current medication. No change made today.     Medication Adjustments/Labs and Tests Ordered: Current medicines are reviewed at length with the patient today.  Concerns regarding medicines are outlined above.  Medication changes, Labs and Tests ordered today are listed in the Patient Instructions below. Patient Instructions  Medication Instructions:  1. Your physician recommends that you continue on your current medications  as directed. Please refer to the Current Medication list given  to you today.   Labwork: NONE ORDERED TODAY  Testing/Procedures: NONE ORDERED TODAY  Follow-Up: Your physician wants you to follow-up in: 1 YEAR WITH DR. END  You will receive a reminder letter in the mail two months in advance. If you don't receive a letter, please call our office to schedule the follow-up appointment.   Any Other Special Instructions Will Be Listed Below (If Applicable).     If you need a refill on your cardiac medications before your next appointment, please call your pharmacy.      Lorelei Pont, [place]  03/03/2018 9:52 AM    [facility] [address] [place], [place], [place]  [address] Phone: [phone]; Fax: [phone]

## 2018-03-03 ENCOUNTER — Ambulatory Visit (INDEPENDENT_AMBULATORY_CARE_PROVIDER_SITE_OTHER): Payer: Self-pay | Admitting: Physician Assistant

## 2018-03-03 ENCOUNTER — Ambulatory Visit (INDEPENDENT_AMBULATORY_CARE_PROVIDER_SITE_OTHER): Payer: Self-pay | Admitting: *Deleted

## 2018-03-03 VITALS — BP 138/82 | HR 62 | Ht 68.0 in | Wt 176.0 lb

## 2018-03-03 DIAGNOSIS — I480 Paroxysmal atrial fibrillation: Secondary | ICD-10-CM

## 2018-03-03 DIAGNOSIS — Z5181 Encounter for therapeutic drug level monitoring: Secondary | ICD-10-CM

## 2018-03-03 DIAGNOSIS — E785 Hyperlipidemia, unspecified: Secondary | ICD-10-CM

## 2018-03-03 DIAGNOSIS — I2510 Atherosclerotic heart disease of native coronary artery without angina pectoris: Secondary | ICD-10-CM

## 2018-03-03 DIAGNOSIS — I10 Essential (primary) hypertension: Secondary | ICD-10-CM

## 2018-03-03 LAB — POCT INR: INR: 2.2

## 2018-03-03 MED ORDER — NITROGLYCERIN 0.4 MG SL SUBL: 0.4000 mg | SUBLINGUAL_TABLET | SUBLINGUAL | 3 refills | Status: DC | PRN

## 2018-03-03 NOTE — Patient Instructions (Signed)
Description   Continue taking 1 tablet daily except 1/2 tablet on Tuesdays, Thursdays, and Saturdays. Recheck in 6 weeks.  Main # [phone] [facility]# [phone]

## 2018-03-03 NOTE — Patient Instructions (Signed)
Medication Instructions:  1. Your physician recommends that you continue on your current medications as directed. Please refer to the Current Medication list given to you today.   Labwork: NONE ORDERED TODAY  Testing/Procedures: NONE ORDERED TODAY  Follow-Up: Your physician wants you to follow-up in: 1 YEAR WITH DR. END  You will receive a reminder letter in the mail two months in advance. If you don't receive a letter, please call our office to schedule the follow-up appointment.   Any Other Special Instructions Will Be Listed Below (If Applicable).     If you need a refill on your cardiac medications before your next appointment, please call your pharmacy.

## 2018-03-31 ENCOUNTER — Other Ambulatory Visit: Payer: Self-pay | Admitting: Internal Medicine

## 2018-03-31 DIAGNOSIS — E785 Hyperlipidemia, unspecified: Secondary | ICD-10-CM

## 2018-03-31 DIAGNOSIS — I10 Essential (primary) hypertension: Secondary | ICD-10-CM

## 2018-03-31 MED ORDER — LISINOPRIL 10 MG PO TABS: 10.0000 mg | ORAL_TABLET | Freq: Every day | ORAL | 3 refills | Status: DC

## 2018-03-31 MED ORDER — CLOPIDOGREL BISULFATE 75 MG PO TABS: 75.0000 mg | ORAL_TABLET | Freq: Every day | ORAL | 3 refills | Status: DC

## 2018-03-31 MED ORDER — METOPROLOL TARTRATE 25 MG PO TABS: 25.0000 mg | ORAL_TABLET | Freq: Two times a day (BID) | ORAL | 3 refills | Status: DC

## 2018-03-31 NOTE — Telephone Encounter (Signed)
Please review for refill. Thanks!  

## 2018-04-14 ENCOUNTER — Telehealth: Payer: Self-pay | Admitting: Internal Medicine

## 2018-04-14 ENCOUNTER — Ambulatory Visit (INDEPENDENT_AMBULATORY_CARE_PROVIDER_SITE_OTHER): Payer: Self-pay | Admitting: *Deleted

## 2018-04-14 DIAGNOSIS — I480 Paroxysmal atrial fibrillation: Secondary | ICD-10-CM

## 2018-04-14 DIAGNOSIS — Z5181 Encounter for therapeutic drug level monitoring: Secondary | ICD-10-CM

## 2018-04-14 LAB — POCT INR: INR: 2.9 (ref 2.0–3.0)

## 2018-04-14 NOTE — Telephone Encounter (Signed)
° °  [facility] Pre-operative Risk Assessment    Request for surgical clearance:  1. What type of surgery is being performed? 2 Tooth extractions  2. When is this surgery scheduled? Pending.Today if possible  3. What type of clearance is required (medical clearance vs. Pharmacy clearance to hold med vs. Both)? Warfarin   4. Are there any medications that need to be held prior to surgery and how long?Warfain  5. Practice name and name of physician performing surgery? Dr Arnell Sieving  6. What is your office phone number [phone]   7.   What is your office fax number  [phone]  8.   Anesthesia type (None, local, MAC, general) ? Local   [name] 04/14/2018, 8:19 AM  _________________________________________________________________   (provider comments below)

## 2018-04-14 NOTE — Telephone Encounter (Signed)
Spoke with Dr. Merton BorderArthur as pt in the chair now. He is comfortable doing procedure on therapeutic warfarin. He would however prefer that INR is checked prior to ensure pt is not supratherapeutic. Pt has appt for INR check tomorrow. He will be scheduled for extraction tomorrow after our appt. Dental office will send form to be completed by our office.   Pt then called back and states needs appt today. Have added him on at noon.

## 2018-04-14 NOTE — Patient Instructions (Signed)
Description   Continue taking 1 tablet daily except 1/2 tablet on Tuesdays, Thursdays, and Saturdays. Recheck in 6 weeks.  Main # [phone] [facility]# [phone]

## 2018-05-27 ENCOUNTER — Ambulatory Visit (INDEPENDENT_AMBULATORY_CARE_PROVIDER_SITE_OTHER): Payer: Self-pay | Admitting: Pharmacist

## 2018-05-27 DIAGNOSIS — Z5181 Encounter for therapeutic drug level monitoring: Secondary | ICD-10-CM

## 2018-05-27 DIAGNOSIS — I480 Paroxysmal atrial fibrillation: Secondary | ICD-10-CM

## 2018-05-27 LAB — POCT INR: INR: 3.5 — AB (ref 2.0–3.0)

## 2018-05-27 NOTE — Patient Instructions (Signed)
Description   Skip of warfarin today then continue taking 1 tablet daily except 1/2 tablet on Tuesdays, Thursdays, and Saturdays. Recheck in 3 weeks.  Main # [phone] [facility]# [phone]

## 2018-06-17 ENCOUNTER — Ambulatory Visit (INDEPENDENT_AMBULATORY_CARE_PROVIDER_SITE_OTHER): Payer: Self-pay

## 2018-06-17 DIAGNOSIS — I480 Paroxysmal atrial fibrillation: Secondary | ICD-10-CM

## 2018-06-17 DIAGNOSIS — Z5181 Encounter for therapeutic drug level monitoring: Secondary | ICD-10-CM

## 2018-06-17 LAB — POCT INR: INR: 1.9 — AB (ref 2.0–3.0)

## 2018-06-17 NOTE — Patient Instructions (Signed)
Description   Take 1 tablet today, then resume same dosage 1 tablet daily except 1/2 tablet on Tuesdays, Thursdays, and Saturdays. Recheck in 3 weeks.  Main # [phone] [facility]# [phone]

## 2018-07-04 ENCOUNTER — Other Ambulatory Visit: Payer: Self-pay | Admitting: Internal Medicine

## 2018-07-04 DIAGNOSIS — I10 Essential (primary) hypertension: Secondary | ICD-10-CM

## 2018-07-04 DIAGNOSIS — E785 Hyperlipidemia, unspecified: Secondary | ICD-10-CM

## 2018-07-04 NOTE — Telephone Encounter (Signed)
Please review for refill, Thanks !  

## 2018-07-04 NOTE — Telephone Encounter (Signed)
Order Providers   Prescribing Provider Encounter Provider  End, Cristal Deerhristopher, MD End, Cristal Deerhristopher, MD  Outpatient Medication Detail    Disp Refills Start End   lisinopril (PRINIVIL,ZESTRIL) 10 MG tablet 90 tablet 3 03/31/2018    Sig - Route: Take 1 tablet (10 mg total) by mouth daily. - Oral   Sent to pharmacy as: lisinopril (PRINIVIL,ZESTRIL) 10 MG tablet   E-Prescribing Status: Receipt confirmed by pharmacy (03/31/2018 3:12 PM EDT)   Associated Diagnoses   Essential hypertension     Hyperlipidemia, unspecified hyperlipidemia type     Pharmacy   [id number] - [place], [place] - [address]

## 2018-07-04 NOTE — Telephone Encounter (Signed)
Outpatient Medication Detail    Disp Refills Start End   clopidogrel (PLAVIX) 75 MG tablet 90 tablet 3 03/31/2018    Sig - Route: Take 1 tablet (75 mg total) by mouth daily. Please keep upcoming appt for future refills. Thank you - Oral   Sent to pharmacy as: clopidogrel (PLAVIX) 75 MG tablet   Notes to Pharmacy: Please consider 90 day supplies to promote better adherence   E-Prescribing Status: Receipt confirmed by pharmacy (03/31/2018 3:12 PM EDT)   Pharmacy   [id number] - [place], [place] - [address]

## 2018-07-08 ENCOUNTER — Ambulatory Visit (INDEPENDENT_AMBULATORY_CARE_PROVIDER_SITE_OTHER): Payer: Self-pay | Admitting: Pharmacist

## 2018-07-08 DIAGNOSIS — I480 Paroxysmal atrial fibrillation: Secondary | ICD-10-CM

## 2018-07-08 DIAGNOSIS — Z5181 Encounter for therapeutic drug level monitoring: Secondary | ICD-10-CM

## 2018-07-08 LAB — PROTIME-INR
INR: 9.4 (ref 0.8–1.2)
PROTHROMBIN TIME: 87.7 s — AB (ref 9.1–12.0)

## 2018-07-08 LAB — POCT INR: INR: 8 — AB (ref 2.0–3.0)

## 2018-07-08 NOTE — Patient Instructions (Signed)
Description   Do not take any warfarin until we call you with stat INR results. If you see any bleeding or bruising, please go to the emergency room. Main # [phone] [facility]# [phone]

## 2018-07-11 ENCOUNTER — Ambulatory Visit (INDEPENDENT_AMBULATORY_CARE_PROVIDER_SITE_OTHER): Payer: Self-pay | Admitting: Pharmacist

## 2018-07-11 DIAGNOSIS — I480 Paroxysmal atrial fibrillation: Secondary | ICD-10-CM

## 2018-07-11 DIAGNOSIS — Z5181 Encounter for therapeutic drug level monitoring: Secondary | ICD-10-CM

## 2018-07-11 LAB — POCT INR: INR: 2 (ref 2.0–3.0)

## 2018-07-11 NOTE — Patient Instructions (Signed)
Description   Restart warfarin with 1 tablet daily, except 1/2 tablet on Tuesdays, Thursdays, and Saturdays. EXCEPT while on fluconazole take only 1/2 tablet on Wednesdays.   Main # [phone] [facility]# [phone]

## 2018-07-18 ENCOUNTER — Ambulatory Visit (INDEPENDENT_AMBULATORY_CARE_PROVIDER_SITE_OTHER): Payer: Self-pay | Admitting: *Deleted

## 2018-07-18 DIAGNOSIS — Z5181 Encounter for therapeutic drug level monitoring: Secondary | ICD-10-CM

## 2018-07-18 DIAGNOSIS — I480 Paroxysmal atrial fibrillation: Secondary | ICD-10-CM

## 2018-07-18 LAB — POCT INR: INR: 2.1 (ref 2.0–3.0)

## 2018-07-18 NOTE — Patient Instructions (Signed)
Continue taking warfarin with 1 tablet daily, except 1/2 tablet on Tuesdays, Thursdays, and Saturdays. EXCEPT while on fluconazole take only 1/2 tablet on Wednesdays.  Main # [phone] [facility]# [phone]

## 2018-07-30 ENCOUNTER — Ambulatory Visit (INDEPENDENT_AMBULATORY_CARE_PROVIDER_SITE_OTHER): Payer: Self-pay

## 2018-07-30 DIAGNOSIS — Z5181 Encounter for therapeutic drug level monitoring: Secondary | ICD-10-CM

## 2018-07-30 DIAGNOSIS — I480 Paroxysmal atrial fibrillation: Secondary | ICD-10-CM

## 2018-07-30 LAB — POCT INR: INR: 2.6 (ref 2.0–3.0)

## 2018-07-30 NOTE — Patient Instructions (Signed)
Description   Continue taking warfarin with 1 tablet daily, except 1/2 tablet on Tuesdays, Thursdays, and Saturdays.  Recheck in 4 weeks.  Main # [phone] [facility]# [phone]

## 2018-09-03 ENCOUNTER — Ambulatory Visit (INDEPENDENT_AMBULATORY_CARE_PROVIDER_SITE_OTHER): Payer: Self-pay | Admitting: *Deleted

## 2018-09-03 DIAGNOSIS — I480 Paroxysmal atrial fibrillation: Secondary | ICD-10-CM

## 2018-09-03 DIAGNOSIS — Z5181 Encounter for therapeutic drug level monitoring: Secondary | ICD-10-CM

## 2018-09-03 LAB — POCT INR: INR: 1.9 — AB (ref 2.0–3.0)

## 2018-09-03 NOTE — Patient Instructions (Signed)
Today take 1 tablet, then start taking  1/2 tablet daily while on antibiotic.    Call us with any bleeding concerns #[phone].

## 2018-09-09 ENCOUNTER — Ambulatory Visit (INDEPENDENT_AMBULATORY_CARE_PROVIDER_SITE_OTHER): Payer: Self-pay | Admitting: *Deleted

## 2018-09-09 DIAGNOSIS — I480 Paroxysmal atrial fibrillation: Secondary | ICD-10-CM

## 2018-09-09 DIAGNOSIS — Z5181 Encounter for therapeutic drug level monitoring: Secondary | ICD-10-CM

## 2018-09-09 LAB — POCT INR: INR: 2.7 (ref 2.0–3.0)

## 2018-09-09 NOTE — Patient Instructions (Signed)
Description   Diflucan 100mg  QD for 7 days started on 09/03/18 then once a week for 3 weeks until 09/30/18. While antibiotic take 1/2 pill everyday except 1 pill on Mondays, Wednesdays and Fridays.            Normal dosage:  Continue taking warfarin with 1 tablet daily, except 1/2 tablet on Tuesdays, Thursdays, and Saturdays.  Recheck in 10 days.   Main # [phone] [facility]# [phone]

## 2018-09-19 ENCOUNTER — Ambulatory Visit (INDEPENDENT_AMBULATORY_CARE_PROVIDER_SITE_OTHER): Payer: Self-pay | Admitting: Pharmacist

## 2018-09-19 DIAGNOSIS — Z5181 Encounter for therapeutic drug level monitoring: Secondary | ICD-10-CM

## 2018-09-19 DIAGNOSIS — I480 Paroxysmal atrial fibrillation: Secondary | ICD-10-CM

## 2018-09-19 LAB — POCT INR: INR: 2.9 (ref 2.0–3.0)

## 2018-09-19 NOTE — Patient Instructions (Signed)
Description   Continue taking warfarin with 1 tablet daily, except 1/2 tablet on Tuesdays, Thursdays, and Saturdays.  Recheck in 4 weeks.  Main # [phone] [facility]# [phone]     

## 2018-11-28 ENCOUNTER — Ambulatory Visit (INDEPENDENT_AMBULATORY_CARE_PROVIDER_SITE_OTHER): Payer: Self-pay | Admitting: Pharmacist

## 2018-11-28 DIAGNOSIS — I480 Paroxysmal atrial fibrillation: Secondary | ICD-10-CM

## 2018-11-28 DIAGNOSIS — Z5181 Encounter for therapeutic drug level monitoring: Secondary | ICD-10-CM

## 2018-11-28 LAB — POCT INR: INR: 2.6 (ref 2.0–3.0)

## 2018-11-28 NOTE — Patient Instructions (Signed)
Continue taking warfarin with 1 tablet daily, except 1/2 tablet on Tuesdays, Thursdays, and Saturdays.  Recheck in 5 weeks.   Main # [phone] [facility]# [phone]

## 2019-01-01 ENCOUNTER — Telehealth: Payer: Self-pay | Admitting: *Deleted

## 2019-01-01 NOTE — Telephone Encounter (Signed)
Pt will start diflucan 100mg  qd for 1 week on 2/22/200 then 1 tab q week. Pt does not want to have INR checked on Monday but will come Tuesday. Pt instructed to take 1/2 tab of Coumadin on Sunday and Monday since med interacts. Pt aware of risks and states he will call if needed. Pt has been on med before & INR was supratherapeutic; please refer to 07/08/2018 visit.

## 2019-01-06 ENCOUNTER — Ambulatory Visit (INDEPENDENT_AMBULATORY_CARE_PROVIDER_SITE_OTHER): Payer: Self-pay | Admitting: Pharmacist

## 2019-01-06 DIAGNOSIS — Z5181 Encounter for therapeutic drug level monitoring: Secondary | ICD-10-CM

## 2019-01-06 DIAGNOSIS — I480 Paroxysmal atrial fibrillation: Secondary | ICD-10-CM

## 2019-01-06 LAB — POCT INR: INR: 2 (ref 2.0–3.0)

## 2019-01-06 NOTE — Patient Instructions (Signed)
Description   Resume taking warfarin with 1 tablet daily, except 1/2 tablet on Tuesdays, Thursdays, and Saturdays. Eat a serving of greens on each of the Saturdays that you take Diflucan.  Recheck in 6 weeks.   Main # [phone] [facility]# [phone]

## 2019-02-16 ENCOUNTER — Telehealth: Payer: Self-pay

## 2019-02-16 NOTE — Telephone Encounter (Signed)

## 2019-02-17 ENCOUNTER — Other Ambulatory Visit: Payer: Self-pay

## 2019-02-17 ENCOUNTER — Ambulatory Visit (INDEPENDENT_AMBULATORY_CARE_PROVIDER_SITE_OTHER): Payer: Self-pay | Admitting: Pharmacist

## 2019-02-17 DIAGNOSIS — I480 Paroxysmal atrial fibrillation: Secondary | ICD-10-CM

## 2019-02-17 DIAGNOSIS — Z5181 Encounter for therapeutic drug level monitoring: Secondary | ICD-10-CM

## 2019-02-17 LAB — POCT INR: INR: 2.5 (ref 2.0–3.0)

## 2019-02-23 ENCOUNTER — Telehealth: Payer: Self-pay

## 2019-02-23 NOTE — Telephone Encounter (Signed)
Virtual Visit Pre-Appointment Phone Call  Steps For Call:  1. Confirm consent - "In the setting of the current Covid19 crisis, you are scheduled for a (phone or video) visit with your provider on (date) at (time).  Just as we do with many in-office visits, in order for you to participate in this visit, we must obtain consent.  If you'd like, I can send this to your mychart (if signed up) or email for you to review.  Otherwise, I can obtain your verbal consent now.  All virtual visits are billed to your insurance company just like a normal visit would be.  By agreeing to a virtual visit, we'd like you to understand that the technology does not allow for your provider to perform an examination, and thus may limit your provider's ability to fully assess your condition.  Finally, though the technology is pretty good, we cannot assure that it will always work on either your or our end, and in the setting of a video visit, we may have to convert it to a phone-only visit.  In either situation, we cannot ensure that we have a secure connection.  Are you willing to proceed?"  2. Give patient instructions for WebEx download to smartphone as below if video visit  3. Advise patient to be prepared with any vital sign or heart rhythm information, their current medicines, and a piece of paper and pen handy for any instructions they may receive the day of their visit  4. Inform patient they will receive a phone call 15 minutes prior to their appointment time (may be from unknown caller ID) so they should be prepared to answer  5. Confirm that appointment type is correct in Epic appointment notes (video vs telephone)    TELEPHONE CALL NOTE  [name] has been deemed a candidate for a follow-up tele-health visit to limit community exposure during the Covid-19 pandemic. I spoke with the patient via phone to ensure availability of phone/video source, confirm preferred email & phone number, and discuss  instructions and expectations.  I reminded [name] to be prepared with any vital sign and/or heart rhythm information that could potentially be obtained via home monitoring, at the time of his visit. I reminded [name] to expect a phone call at the time of his visit if his visit.  Did the patient verbally acknowledge consent to treatment? yes  Clide Dales [name], CMA 02/23/2019 1:20 PM   DOWNLOADING THE WEBEX SOFTWARE TO SMARTPHONE  - If Apple, go to [employer] and type in WebEx in the search bar. Download [employer], the blue/green circle. The app is free but as with any other app downloads, their phone may require them to verify saved payment information or Apple password. The patient does NOT have to create an account.  - If Android, ask patient to go to [employer] and type in WebEx in the search bar. Download [employer], the blue/green circle. The app is free but as with any other app downloads, their phone may require them to verify saved payment information or Android password. The patient does NOT have to create an account.   CONSENT FOR TELE-HEALTH VISIT - PLEASE REVIEW  I hereby voluntarily request, consent and authorize [facility] and its employed or contracted physicians, physician assistants, nurse practitioners or other licensed health care professionals (the Practitioner), to provide me with telemedicine health care services (the "Services") as deemed necessary by the treating Practitioner.  I acknowledge and consent to receive the Services by the Practitioner via telemedicine. I understand that the telemedicine visit will involve communicating with the Practitioner through live audiovisual communication technology and the disclosure of certain medical information by electronic transmission. I acknowledge that I have been given the opportunity to request an in-person assessment or other available alternative prior to the telemedicine visit  and am voluntarily participating in the telemedicine visit.  I understand that I have the right to withhold or withdraw my consent to the use of telemedicine in the course of my care at any time, without affecting my right to future care or treatment, and that the Practitioner or I may terminate the telemedicine visit at any time. I understand that I have the right to inspect all information obtained and/or recorded in the course of the telemedicine visit and may receive copies of available information for a reasonable fee.  I understand that some of the potential risks of receiving the Services via telemedicine include:  [name] Delay or interruption in medical evaluation due to technological equipment failure or disruption; . Information transmitted may not be sufficient (e.g. poor resolution of images) to allow for appropriate medical decision making by the Practitioner; and/or  . In rare instances, security protocols could fail, causing a breach of personal health information.  Furthermore, I acknowledge that it is my responsibility to provide information about my medical history, conditions and care that is complete and accurate to the best of my ability. I acknowledge that Practitioner's advice, recommendations, and/or decision may be based on factors not within their control, such as incomplete or inaccurate data provided by me or distortions of diagnostic images or specimens that may result from electronic transmissions. I understand that the practice of medicine is not an exact science and that Practitioner makes no warranties or guarantees regarding treatment outcomes. I acknowledge that I will receive a copy of this consent concurrently upon execution via email to the email address I last provided but may also request a printed copy by calling the office of [facility].    I understand that my insurance will be billed for this visit.   I have read or had this consent read to me. . I understand  the contents of this consent, which adequately explains the benefits and risks of the Services being provided via telemedicine.  . I have been provided ample opportunity to ask questions regarding this consent and the Services and have had my questions answered to my satisfaction. . I give my informed consent for the services to be provided through the use of telemedicine in my medical care  By participating in this telemedicine visit I agree to the above.

## 2019-02-27 ENCOUNTER — Telehealth (INDEPENDENT_AMBULATORY_CARE_PROVIDER_SITE_OTHER): Payer: Self-pay | Admitting: Interventional Cardiology

## 2019-02-27 ENCOUNTER — Encounter: Payer: Self-pay | Admitting: Interventional Cardiology

## 2019-02-27 ENCOUNTER — Other Ambulatory Visit: Payer: Self-pay

## 2019-02-27 VITALS — Ht 68.0 in | Wt 158.0 lb

## 2019-02-27 DIAGNOSIS — I10 Essential (primary) hypertension: Secondary | ICD-10-CM

## 2019-02-27 DIAGNOSIS — I2510 Atherosclerotic heart disease of native coronary artery without angina pectoris: Secondary | ICD-10-CM

## 2019-02-27 DIAGNOSIS — I480 Paroxysmal atrial fibrillation: Secondary | ICD-10-CM

## 2019-02-27 DIAGNOSIS — E785 Hyperlipidemia, unspecified: Secondary | ICD-10-CM

## 2019-02-27 NOTE — Patient Instructions (Addendum)
Medication Instructions:  Your physician recommends that you continue on your current medications as directed. Please refer to the Current Medication list given to you today.  If you need a refill on your cardiac medications before your next appointment, please call your pharmacy.   Lab work: Your physician recommends that you return for a FASTING lipid profile, complete metabolic panel, and complete blood count on 06/17/19   If you have labs (blood work) drawn today and your tests are completely normal, you will receive your results only by: [name] MyChart Message (if you have MyChart) OR . A paper copy in the mail If you have any lab test that is abnormal or we need to change your treatment, we will call you to review the results.  Testing/Procedures: None ordered  Follow-Up: At [facility], you and your health needs are our priority.  As part of our continuing mission to provide you with exceptional heart care, we have created designated Provider Care Teams.  These Care Teams include your primary Cardiologist (physician) and Advanced Practice Providers (APPs -  Physician Assistants and Nurse Practitioners) who all work together to provide you with the care you need, when you need it. . You will need a follow up appointment in 1 year.  Please call our office 2 months in advance to schedule this appointment.  You may see Everette Rank, MD or one of the following Advanced Practice Providers on your designated Care Team:   . Robbie Lis, PA-C . Dayna Dunn, PA-C . Jacolyn Reedy, PA-C  Any Other Special Instructions Will Be Listed Below (If Applicable).  Your provider recommends that you maintain 150 minutes per week of moderate aerobic activity.   Heart-Healthy Eating Plan Heart-healthy meal planning includes:  Eating less unhealthy fats.  Eating more healthy fats.  Making other changes in your diet. Talk with your doctor or a diet specialist (dietitian) to create an eating plan  that is right for you. What is my plan? Your doctor may recommend an eating plan that includes:  Total fat: ______% or less of total calories a day.  Saturated fat: ______% or less of total calories a day.  Cholesterol: less than _________mg a day. What are tips for following this plan? Cooking Avoid frying your food. Try to bake, boil, grill, or broil it instead. You can also reduce fat by:  Removing the skin from poultry.  Removing all visible fats from meats.  Steaming vegetables in water or broth. Meal planning   At meals, divide your plate into four equal parts: ? Fill one-half of your plate with vegetables and green salads. ? Fill one-fourth of your plate with whole grains. ? Fill one-fourth of your plate with lean protein foods.  Eat 4-5 servings of vegetables per day. A serving of vegetables is: ? 1 cup of raw or cooked vegetables. ? 2 cups of raw leafy greens.  Eat 4-5 servings of fruit per day. A serving of fruit is: ? 1 medium whole fruit. ?  cup of dried fruit. ?  cup of fresh, frozen, or canned fruit. ?  cup of 100% fruit juice.  Eat more foods that have soluble fiber. These are apples, broccoli, carrots, beans, peas, and barley. Try to get 20-30 g of fiber per day.  Eat 4-5 servings of nuts, legumes, and seeds per week: ? 1 serving of dried beans or legumes equals  cup after being cooked. ? 1 serving of nuts is  cup. ? 1 serving of seeds equals  1 tablespoon. General information  Eat more home-cooked food. Eat less restaurant, buffet, and fast food.  Limit or avoid alcohol.  Limit foods that are high in starch and sugar.  Avoid fried foods.  Lose weight if you are overweight.  Keep track of how much salt (sodium) you eat. This is important if you have high blood pressure. Ask your doctor to tell you more about this.  Try to add vegetarian meals each week. Fats  Choose healthy fats. These include olive oil and canola oil, flaxseeds, walnuts,  almonds, and seeds.  Eat more omega-3 fats. These include salmon, mackerel, sardines, tuna, flaxseed oil, and ground flaxseeds. Try to eat fish at least 2 times each week.  Check food labels. Avoid foods with trans fats or high amounts of saturated fat.  Limit saturated fats. ? These are often found in animal products, such as meats, butter, and cream. ? These are also found in plant foods, such as palm oil, palm kernel oil, and coconut oil.  Avoid foods with partially hydrogenated oils in them. These have trans fats. Examples are stick margarine, some tub margarines, cookies, crackers, and other baked goods. What foods can I eat? Fruits All fresh, canned (in natural juice), or frozen fruits. Vegetables Fresh or frozen vegetables (raw, steamed, roasted, or grilled). Green salads. Grains Most grains. Choose whole wheat and whole grains most of the time. Rice and pasta, including brown rice and pastas made with whole wheat. Meats and other proteins Lean, well-trimmed beef, veal, pork, and lamb. Chicken and [place] without skin. All fish and shellfish. Wild duck, rabbit, pheasant, and venison. Egg whites or low-cholesterol egg substitutes. Dried beans, peas, lentils, and tofu. Seeds and most nuts. Dairy Low-fat or nonfat cheeses, including ricotta and mozzarella. Skim or 1% milk that is liquid, powdered, or evaporated. Buttermilk that is made with low-fat milk. Nonfat or low-fat yogurt. Fats and oils Non-hydrogenated (trans-free) margarines. Vegetable oils, including soybean, sesame, sunflower, olive, peanut, safflower, corn, canola, and cottonseed. Salad dressings or mayonnaise made with a vegetable oil. Beverages Mineral water. Coffee and tea. Diet carbonated beverages. Sweets and desserts Sherbet, gelatin, and fruit ice. Small amounts of dark chocolate. Limit all sweets and desserts. Seasonings and condiments All seasonings and condiments. The items listed above may not be a complete  list of foods and drinks you can eat. Contact a dietitian for more options. What foods should I avoid? Fruits Canned fruit in heavy syrup. Fruit in cream or butter sauce. Fried fruit. Limit coconut. Vegetables Vegetables cooked in cheese, cream, or butter sauce. Fried vegetables. Grains Breads that are made with saturated or trans fats, oils, or whole milk. Croissants. Sweet rolls. Donuts. High-fat crackers, such as cheese crackers. Meats and other proteins Fatty meats, such as hot dogs, ribs, sausage, bacon, rib-eye roast or steak. High-fat deli meats, such as salami and bologna. Caviar. Domestic duck and goose. Organ meats, such as liver. Dairy Cream, sour cream, cream cheese, and creamed cottage cheese. Whole-milk cheeses. Whole or 2% milk that is liquid, evaporated, or condensed. Whole buttermilk. Cream sauce or high-fat cheese sauce. Yogurt that is made from whole milk. Fats and oils Meat fat, or shortening. Cocoa butter, hydrogenated oils, palm oil, coconut oil, palm kernel oil. Solid fats and shortenings, including bacon fat, salt pork, lard, and butter. Nondairy cream substitutes. Salad dressings with cheese or sour cream. Beverages Regular sodas and juice drinks with added sugar. Sweets and desserts Frosting. Pudding. Cookies. Cakes. Pies. Milk chocolate or white chocolate. Buttered  syrups. Full-fat ice cream or ice cream drinks. The items listed above may not be a complete list of foods and drinks to avoid. Contact a dietitian for more information. Summary  Heart-healthy meal planning includes eating less unhealthy fats, eating more healthy fats, and making other changes in your diet.  Eat a balanced diet. This includes fruits and vegetables, low-fat or nonfat dairy, lean protein, nuts and legumes, whole grains, and heart-healthy oils and fats. This information is not intended to replace advice given to you by your health care provider. Make sure you discuss any questions you have  with your health care provider. Document Released: 04/29/2012 Document Revised: 12/06/2017 Document Reviewed: 12/06/2017 Elsevier Interactive Patient Education  2019 [employer].

## 2019-02-27 NOTE — Progress Notes (Signed)
Virtual Visit via Video Note   This visit type was conducted due to national recommendations for restrictions regarding the COVID-19 Pandemic (e.g. social distancing) in an effort to limit this patient's exposure and mitigate transmission in our community.  Due to his co-morbid illnesses, this patient is at least at moderate risk for complications without adequate follow up.  This format is felt to be most appropriate for this patient at this time.  All issues noted in this document were discussed and addressed.  A limited physical exam was performed with this format.  Please refer to the patient's chart for his consent to telehealth for [facility].   Evaluation Performed:  Follow-up visit  Date:  02/27/2019   ID:  [name], DOB 23-Dec-1955, MRN [id number]  Patient Location: Home Provider Location: Home  PCP:  Lorenda Ishihara, MD  Cardiologist:  No primary care provider on file. Jaidynn Balster Electrophysiologist:  None   Chief Complaint:  CAD  History of Present Illness:    [name] is a 63 y.o. male with  CAD, PAF, Carotid artery disease, HTN, HLD and subclavian steal syndrome presents for follow up.   Hx of Coronary artery disease status post PCI to the mid RCA in 11/2016 complicated by right groin pseudoaneurysm.  I  Cath in 2018 showed: 1. Significant 2 vessel coronary artery disease, including 95% midvessel stenosis of very large RCA, as well as chronic total occlusion of small apical LAD. 2. Mild diffuse disease involving LAD and LCx. 3. Normal left ventricular filling pressure. 4. Normal left ventricular contraction. 5. Successful IVUS-guided PCI of mid RCA with placement of Xience Alpine 3.5 x 28 mm drug-eluting stent (post-dilated with 4.5 mm non-compliant balloon) with 0% residual stenosis and TIMI-3 flow.  The patient does not have symptoms concerning for COVID-19 infection (fever, chills, cough, or new shortness of breath).   Denies : Chest pain.  Dizziness. Leg edema. Nitroglycerin use. Orthopnea. Palpitations. Paroxysmal nocturnal dyspnea. Shortness of breath. Syncope.   He does not exercise.  No arm weakness, despite right subclavian disease.     Past Medical History:  Diagnosis Date  . Carotid artery occlusion    Right Carotid Bruit  . Chest pain 2003   neg evaluation  . Coronary artery disease    a. 12/10/16 PCI with DES--> RCA, normal EF  . History of kidney stones   . HTN (hypertension)   . Hyperlipemia   . Leg pain    a. ABIs (12/10):  Normal  . Paroxysmal atrial fibrillation (HCC)   . Subclavian steal syndrome Aug. 2015   Occult Right   Past Surgical History:  Procedure Laterality Date  . CARDIAC CATHETERIZATION N/A 12/10/2016   Procedure: Left Heart Cath and Coronary Angiography;  Surgeon: Yvonne Kendall, MD;  [facility] INVASIVE CV LAB;  Service: Cardiovascular;  Laterality: N/A;  . CARDIAC CATHETERIZATION N/A 12/10/2016   Procedure: Coronary Stent Intervention;  Surgeon: Yvonne Kendall, MD;  Location: [facility] INVASIVE CV LAB;  Service: Cardiovascular;  Laterality: N/A;  . CARDIAC CATHETERIZATION N/A 12/10/2016   Procedure: Intravascular Ultrasound/IVUS;  Surgeon: Yvonne Kendall, MD;  Location: [facility] INVASIVE CV LAB;  Service: Cardiovascular;  Laterality: N/A;  . COLONOSCOPY  10/2007   negative results  . COLONOSCOPY W/ POLYPECTOMY  2003  . CORONARY ANGIOPLASTY WITH STENT PLACEMENT  12/10/2016  . CYSTECTOMY     removed from back  . epidural steroids     in the back x3  . JOINT REPLACEMENT Right February 29, 1976  KNEE  . REPAIR PATELLAR TENDON Right   . SHOULDER ARTHROSCOPY WITH OPEN ROTATOR CUFF REPAIR Right      Current Meds  Medication Sig  . atorvastatin (LIPITOR) 40 MG tablet TAKE 1 TABLET BY MOUTH ONCE DAILY AT 6:00PM  . clopidogrel (PLAVIX) 75 MG tablet Take 1 tablet (75 mg total) by mouth daily. Please keep upcoming appt for future refills. Thank you  . doxycycline (VIBRA-TABS) 100 MG tablet Take 100  mg by mouth daily.  [name]. lisinopril (PRINIVIL,ZESTRIL) 10 MG tablet Take 1 tablet (10 mg total) by mouth daily.  . metoprolol tartrate (LOPRESSOR) 25 MG tablet Take 1 tablet (25 mg total) by mouth 2 (two) times daily.  . nitroGLYCERIN (NITROSTAT) 0.4 MG SL tablet Place 1 tablet (0.4 mg total) under the tongue every 5 (five) minutes as needed for chest pain.  . ranitidine (ZANTAC) 150 MG tablet Take 150 mg by mouth daily as needed for heartburn.  . warfarin (COUMADIN) 5 MG tablet TAKE AS DIRECTED BY THE COUMADIN CLINIC     Allergies:   Tetanus toxoid   Social History   Tobacco Use  . Smoking status: Former Smoker    Packs/day: 0.50    Years: 27.00    Pack years: 13.50    Types: Cigarettes    Last attempt to quit: 12/07/2016    Years since quitting: 2.2  . Smokeless tobacco: Never Used  . Tobacco comment: one week ago  Substance Use Topics  . Alcohol use: Yes    Comment: 12/10/2016 "if I have a beer once/month that's alot"  . Drug use: No     Family Hx: The patient's family history includes Cancer in his brother; Heart attack in his father; Heart disease in his father; Stroke in his brother.    ROS:   Please see the history of present illness.    Occasional left hand weakness All other systems reviewed and are negative.   Prior CV studies:   The following studies were reviewed today:  Cath results reviewed  Labs/Other Tests and Data Reviewed:    EKG:  An ECG dated 4/19 was personally reviewed today and demonstrated:  NSR, no ST changes  Recent Labs: No results found for requested labs within last 8760 hours.   Recent Lipid Panel Lab Results  Component Value Date/Time   CHOL 106 02/07/2018 09:30 AM   TRIG 114 02/07/2018 09:30 AM   HDL 42 02/07/2018 09:30 AM   CHOLHDL 2.5 02/07/2018 09:30 AM   CHOLHDL 5.1 CALC 10/04/2008 08:47 AM   LDLCALC 41 02/07/2018 09:30 AM   LDLDIRECT 120.9 10/04/2008 08:47 AM    Wt Readings from Last 3 Encounters:  02/27/19 158 lb (71.7 kg)   03/03/18 176 lb (79.8 kg)  04/05/17 179 lb (81.2 kg)     Objective:    Vital Signs:  Ht 5\' 8"  (1.727 m)   Wt 158 lb (71.7 kg)   BMI 24.02 kg/m    VITAL SIGNS:  reviewed GEN:  no acute distress RESPIRATORY:  normal respiratory effort, symmetric expansion limied exam due to video format  ASSESSMENT & PLAN:    1. CAD: No angina.  Continue aggressive secondary prevention.  No aspirin.  Continue clopidogrel. 2. Hyperlipidemia: LDL 41 in 3/19.  Due for repeat labs, when coronavirus concerns decreased. 3. PAF: No palpitations.  No bleeding issues with warfarin.  4. HTN: Not checking at home regualrly.  When he does check, it was in the 121/78 range.  5. Use home gym.  I stressed the importance of a plant-based diet.  COVID-19 Education: The signs and symptoms of COVID-19 were discussed with the patient and how to seek care for testing (follow up with PCP or arrange E-visit).  The importance of social distancing was discussed today.  Time:   Today, I have spent 25 minutes with the patient with telehealth technology discussing the above problems.     Medication Adjustments/Labs and Tests Ordered: Current medicines are reviewed at length with the patient today.  Concerns regarding medicines are outlined above.   Tests Ordered: No orders of the defined types were placed in this encounter.   Medication Changes: No orders of the defined types were placed in this encounter.   Disposition:  Follow up in 1 year(s)  Signed, Lance Muss, MD  02/27/2019 11:11 AM    [facility]

## 2019-03-13 ENCOUNTER — Other Ambulatory Visit: Payer: Self-pay | Admitting: Internal Medicine

## 2019-03-13 NOTE — Telephone Encounter (Signed)
Refill Request.  

## 2019-04-01 ENCOUNTER — Other Ambulatory Visit: Payer: Self-pay | Admitting: Internal Medicine

## 2019-04-01 ENCOUNTER — Other Ambulatory Visit: Payer: Self-pay | Admitting: Physician Assistant

## 2019-04-01 DIAGNOSIS — I10 Essential (primary) hypertension: Secondary | ICD-10-CM

## 2019-04-01 DIAGNOSIS — E785 Hyperlipidemia, unspecified: Secondary | ICD-10-CM

## 2019-04-02 ENCOUNTER — Other Ambulatory Visit: Payer: Self-pay | Admitting: Internal Medicine

## 2019-04-02 NOTE — Telephone Encounter (Signed)
Please review for refill, Thanks !  

## 2019-04-02 NOTE — Telephone Encounter (Signed)
Please review for refill.  

## 2019-04-03 NOTE — Telephone Encounter (Signed)
Refill Request.  

## 2019-04-10 ENCOUNTER — Telehealth: Payer: Self-pay | Admitting: Pharmacist

## 2019-04-10 NOTE — Telephone Encounter (Signed)
1. COVID-19 Pre-Screening Questions:  . In the past 7 to 10 days have you had a cough,  shortness of breath, headache, congestion, fever (100 or greater) body aches, chills, sore throat, or sudden loss of taste or sense of smell? no . Have you been around anyone with known Covid 19. no . Have you been around anyone who is awaiting Covid 19 test results in the past 7 to 10 days? no . Have you been around anyone who has been exposed to Covid 19, or has mentioned symptoms of Covid 19 within the past 7 to 10 days? no   2. Pt advised of visitor restrictions (no visitors allowed except if needed to conduct the visit). Also advised to arrive at appointment time and wear a mask.   3. Patient aware of in-office visit   

## 2019-04-14 ENCOUNTER — Ambulatory Visit (INDEPENDENT_AMBULATORY_CARE_PROVIDER_SITE_OTHER): Payer: Self-pay

## 2019-04-14 ENCOUNTER — Other Ambulatory Visit: Payer: Self-pay

## 2019-04-14 DIAGNOSIS — Z5181 Encounter for therapeutic drug level monitoring: Secondary | ICD-10-CM

## 2019-04-14 DIAGNOSIS — I480 Paroxysmal atrial fibrillation: Secondary | ICD-10-CM

## 2019-04-14 LAB — POCT INR: INR: 2.5 (ref 2.0–3.0)

## 2019-04-14 NOTE — Patient Instructions (Signed)
Description   Continue taking 1 tablet daily, except 1/2 tablet on Tuesdays, Thursdays, and Saturdays. Recheck in 8 weeks.  Main # [phone] [facility]# [phone]    

## 2019-06-04 ENCOUNTER — Telehealth: Payer: Self-pay

## 2019-06-04 NOTE — Telephone Encounter (Signed)
Spoke with pt regarding covid-19 screening prior to appt. Pt stated he has not been in contact with anyone who may have covid-19 and has no symptoms. 

## 2019-06-09 ENCOUNTER — Ambulatory Visit (INDEPENDENT_AMBULATORY_CARE_PROVIDER_SITE_OTHER): Payer: Self-pay | Admitting: *Deleted

## 2019-06-09 ENCOUNTER — Other Ambulatory Visit: Payer: Self-pay

## 2019-06-09 DIAGNOSIS — I480 Paroxysmal atrial fibrillation: Secondary | ICD-10-CM

## 2019-06-09 DIAGNOSIS — Z5181 Encounter for therapeutic drug level monitoring: Secondary | ICD-10-CM

## 2019-06-09 LAB — POCT INR: INR: 2.6 (ref 2.0–3.0)

## 2019-06-09 NOTE — Patient Instructions (Signed)
Description   Continue taking 1 tablet daily, except 1/2 tablet on Tuesdays, Thursdays, and Saturdays. Recheck in 8 weeks.  Main # [phone] [facility]# [phone]

## 2019-06-17 ENCOUNTER — Other Ambulatory Visit: Payer: Self-pay

## 2019-06-30 ENCOUNTER — Other Ambulatory Visit: Payer: Self-pay | Admitting: Internal Medicine

## 2019-06-30 NOTE — Telephone Encounter (Signed)
Please review for refill. Thanks!  

## 2019-08-04 ENCOUNTER — Ambulatory Visit (INDEPENDENT_AMBULATORY_CARE_PROVIDER_SITE_OTHER): Payer: Self-pay | Admitting: *Deleted

## 2019-08-04 ENCOUNTER — Other Ambulatory Visit: Payer: Self-pay

## 2019-08-04 DIAGNOSIS — I480 Paroxysmal atrial fibrillation: Secondary | ICD-10-CM

## 2019-08-04 DIAGNOSIS — Z5181 Encounter for therapeutic drug level monitoring: Secondary | ICD-10-CM

## 2019-08-04 LAB — POCT INR: INR: 2.1 (ref 2.0–3.0)

## 2019-08-04 NOTE — Patient Instructions (Signed)
Description   Continue taking 1 tablet daily, except 1/2 tablet on Tuesdays, Thursdays, and Saturdays. Recheck in 8 weeks.  Main # [phone] [facility]# [phone]

## 2019-09-29 ENCOUNTER — Ambulatory Visit (INDEPENDENT_AMBULATORY_CARE_PROVIDER_SITE_OTHER): Payer: Self-pay | Admitting: *Deleted

## 2019-09-29 ENCOUNTER — Other Ambulatory Visit: Payer: Self-pay

## 2019-09-29 DIAGNOSIS — Z5181 Encounter for therapeutic drug level monitoring: Secondary | ICD-10-CM

## 2019-09-29 DIAGNOSIS — I480 Paroxysmal atrial fibrillation: Secondary | ICD-10-CM

## 2019-09-29 LAB — POCT INR: INR: 1.8 — AB (ref 2.0–3.0)

## 2019-09-29 NOTE — Patient Instructions (Signed)
Description   Take 1 tablet today, then continue taking 1 tablet daily, except 1/2 tablet on Tuesdays, Thursdays, and Saturdays. Recheck in 3 weeks.  Main # [phone] [facility]# [phone]

## 2019-10-17 ENCOUNTER — Other Ambulatory Visit: Payer: Self-pay | Admitting: Internal Medicine

## 2019-10-19 NOTE — Telephone Encounter (Signed)
Refill Request.  

## 2019-10-20 ENCOUNTER — Ambulatory Visit (INDEPENDENT_AMBULATORY_CARE_PROVIDER_SITE_OTHER): Payer: Self-pay | Admitting: *Deleted

## 2019-10-20 ENCOUNTER — Other Ambulatory Visit: Payer: Self-pay

## 2019-10-20 DIAGNOSIS — I2510 Atherosclerotic heart disease of native coronary artery without angina pectoris: Secondary | ICD-10-CM

## 2019-10-20 DIAGNOSIS — E785 Hyperlipidemia, unspecified: Secondary | ICD-10-CM

## 2019-10-20 DIAGNOSIS — I480 Paroxysmal atrial fibrillation: Secondary | ICD-10-CM

## 2019-10-20 DIAGNOSIS — Z5181 Encounter for therapeutic drug level monitoring: Secondary | ICD-10-CM

## 2019-10-20 DIAGNOSIS — I10 Essential (primary) hypertension: Secondary | ICD-10-CM

## 2019-10-20 LAB — COMPREHENSIVE METABOLIC PANEL
ALT: 11 IU/L (ref 0–44)
AST: 18 IU/L (ref 0–40)
Albumin/Globulin Ratio: 2.4 — ABNORMAL HIGH (ref 1.2–2.2)
Albumin: 4.6 g/dL (ref 3.8–4.8)
Alkaline Phosphatase: 80 IU/L (ref 39–117)
BUN/Creatinine Ratio: 15 (ref 10–24)
BUN: 12 mg/dL (ref 8–27)
Bilirubin Total: 0.3 mg/dL (ref 0.0–1.2)
CO2: 22 mmol/L (ref 20–29)
Calcium: 9.4 mg/dL (ref 8.6–10.2)
Chloride: 106 mmol/L (ref 96–106)
Creatinine, Ser: 0.79 mg/dL (ref 0.76–1.27)
GFR calc Af Amer: 111 mL/min/{1.73_m2} (ref >59)
GFR calc non Af Amer: 96 mL/min/{1.73_m2} (ref >59)
Globulin, Total: 1.9 g/dL (ref 1.5–4.5)
Glucose: 90 mg/dL (ref 65–99)
Potassium: 4.3 mmol/L (ref 3.5–5.2)
Sodium: 142 mmol/L (ref 134–144)
Total Protein: 6.5 g/dL (ref 6.0–8.5)

## 2019-10-20 LAB — CBC
Hematocrit: 44.4 % (ref 37.5–51.0)
Hemoglobin: 15.1 g/dL (ref 13.0–17.7)
MCH: 30.7 pg (ref 26.6–33.0)
MCHC: 34 g/dL (ref 31.5–35.7)
MCV: 90 fL (ref 79–97)
Platelets: 283 10*3/uL (ref 150–450)
RBC: 4.92 x10E6/uL (ref 4.14–5.80)
RDW: 12.9 % (ref 11.6–15.4)
WBC: 14.4 10*3/uL — ABNORMAL HIGH (ref 3.4–10.8)

## 2019-10-20 LAB — POCT INR: INR: 1.6 — AB (ref 2.0–3.0)

## 2019-10-20 LAB — LIPID PANEL
Chol/HDL Ratio: 2.5 ratio (ref 0.0–5.0)
Cholesterol, Total: 110 mg/dL (ref 100–199)
HDL: 44 mg/dL (ref >39)
LDL Chol Calc (NIH): 39 mg/dL (ref 0–99)
Triglycerides: 164 mg/dL — ABNORMAL HIGH (ref 0–149)
VLDL Cholesterol Cal: 27 mg/dL (ref 5–40)

## 2019-10-20 NOTE — Patient Instructions (Signed)
Description   Take 1 tablet today, then start taking 1 tablet daily except for 1/2 a tablet on Tuesday and Saturday. Recheck in 2 weeks.  Main # [phone] [facility]# [phone]

## 2019-10-21 ENCOUNTER — Other Ambulatory Visit: Payer: Self-pay

## 2019-11-03 ENCOUNTER — Other Ambulatory Visit: Payer: Self-pay

## 2019-11-03 ENCOUNTER — Ambulatory Visit (INDEPENDENT_AMBULATORY_CARE_PROVIDER_SITE_OTHER): Payer: Self-pay | Admitting: *Deleted

## 2019-11-03 DIAGNOSIS — Z5181 Encounter for therapeutic drug level monitoring: Secondary | ICD-10-CM

## 2019-11-03 DIAGNOSIS — I480 Paroxysmal atrial fibrillation: Secondary | ICD-10-CM

## 2019-11-03 LAB — POCT INR: INR: 2.3 (ref 2.0–3.0)

## 2019-11-03 NOTE — Patient Instructions (Signed)
Description   Continue taking 1 tablet daily except for 1/2 a tablet on Tuesday and Saturday. Recheck in 4 weeks.  Main # [phone] [facility]# [phone]

## 2019-12-01 ENCOUNTER — Other Ambulatory Visit: Payer: Self-pay

## 2019-12-01 ENCOUNTER — Ambulatory Visit (INDEPENDENT_AMBULATORY_CARE_PROVIDER_SITE_OTHER): Payer: Self-pay | Admitting: *Deleted

## 2019-12-01 DIAGNOSIS — Z5181 Encounter for therapeutic drug level monitoring: Secondary | ICD-10-CM

## 2019-12-01 DIAGNOSIS — I480 Paroxysmal atrial fibrillation: Secondary | ICD-10-CM

## 2019-12-01 LAB — POCT INR: INR: 3 (ref 2.0–3.0)

## 2019-12-01 NOTE — Patient Instructions (Signed)
Description   Continue taking 1 tablet daily except for 1/2 tablet on Tuesday and Saturday. Recheck in 4 weeks.  Main # [phone] [facility]# [phone]

## 2019-12-29 ENCOUNTER — Other Ambulatory Visit: Payer: Self-pay

## 2019-12-29 ENCOUNTER — Ambulatory Visit (INDEPENDENT_AMBULATORY_CARE_PROVIDER_SITE_OTHER): Payer: Self-pay | Admitting: Pharmacist

## 2019-12-29 DIAGNOSIS — Z5181 Encounter for therapeutic drug level monitoring: Secondary | ICD-10-CM

## 2019-12-29 DIAGNOSIS — I480 Paroxysmal atrial fibrillation: Secondary | ICD-10-CM

## 2019-12-29 LAB — POCT INR: INR: 2.5 (ref 2.0–3.0)

## 2019-12-29 NOTE — Patient Instructions (Signed)
Description   Continue taking 1 tablet daily except for 1/2 tablet on Tuesday and Saturday. Recheck in 8 weeks.  Main # [phone] [facility]# [phone]

## 2020-01-20 ENCOUNTER — Other Ambulatory Visit: Payer: Self-pay | Admitting: Internal Medicine

## 2020-01-21 NOTE — Telephone Encounter (Signed)
Please review for refill, Thanks !  

## 2020-02-23 ENCOUNTER — Other Ambulatory Visit: Payer: Self-pay

## 2020-02-23 ENCOUNTER — Ambulatory Visit (INDEPENDENT_AMBULATORY_CARE_PROVIDER_SITE_OTHER): Payer: Self-pay | Admitting: *Deleted

## 2020-02-23 DIAGNOSIS — Z5181 Encounter for therapeutic drug level monitoring: Secondary | ICD-10-CM

## 2020-02-23 DIAGNOSIS — I480 Paroxysmal atrial fibrillation: Secondary | ICD-10-CM

## 2020-02-23 LAB — POCT INR: INR: 2.7 (ref 2.0–3.0)

## 2020-02-23 NOTE — Patient Instructions (Signed)
Description   Continue taking 1 tablet daily except for 1/2 tablet on Tuesday and Saturday. Recheck in 8 weeks.  Main # [phone] [facility]# [phone]

## 2020-03-13 ENCOUNTER — Emergency Department (HOSPITAL_COMMUNITY)
Admission: EM | Admit: 2020-03-13 | Discharge: 2020-03-13 | Disposition: A | Discharge status: Home / Self Care | Payer: Self-pay | Attending: Emergency Medicine | Admitting: Emergency Medicine

## 2020-03-13 ENCOUNTER — Telehealth: Payer: Self-pay | Admitting: Student

## 2020-03-13 ENCOUNTER — Encounter (HOSPITAL_COMMUNITY): Payer: Self-pay | Admitting: Emergency Medicine

## 2020-03-13 DIAGNOSIS — Z96651 Presence of right artificial knee joint: Secondary | ICD-10-CM | POA: Insufficient documentation

## 2020-03-13 DIAGNOSIS — I2510 Atherosclerotic heart disease of native coronary artery without angina pectoris: Secondary | ICD-10-CM | POA: Insufficient documentation

## 2020-03-13 DIAGNOSIS — Z7902 Long term (current) use of antithrombotics/antiplatelets: Secondary | ICD-10-CM | POA: Insufficient documentation

## 2020-03-13 DIAGNOSIS — Z87891 Personal history of nicotine dependence: Secondary | ICD-10-CM | POA: Insufficient documentation

## 2020-03-13 DIAGNOSIS — I10 Essential (primary) hypertension: Secondary | ICD-10-CM | POA: Insufficient documentation

## 2020-03-13 DIAGNOSIS — Z7901 Long term (current) use of anticoagulants: Secondary | ICD-10-CM | POA: Insufficient documentation

## 2020-03-13 DIAGNOSIS — M79601 Pain in right arm: Secondary | ICD-10-CM | POA: Insufficient documentation

## 2020-03-13 DIAGNOSIS — Z79899 Other long term (current) drug therapy: Secondary | ICD-10-CM | POA: Insufficient documentation

## 2020-03-13 LAB — COMPREHENSIVE METABOLIC PANEL
ALT: 17 U/L (ref 0–44)
AST: 23 U/L (ref 15–41)
Albumin: 4 g/dL (ref 3.5–5.0)
Alkaline Phosphatase: 65 U/L (ref 38–126)
Anion gap: 8 (ref 5–15)
BUN: 13 mg/dL (ref 8–23)
CO2: 26 mmol/L (ref 22–32)
Calcium: 9.2 mg/dL (ref 8.9–10.3)
Chloride: 108 mmol/L (ref 98–111)
Creatinine, Ser: 1.07 mg/dL (ref 0.61–1.24)
GFR calc Af Amer: >60 mL/min (ref >60)
GFR calc non Af Amer: >60 mL/min (ref >60)
Glucose, Bld: 85 mg/dL (ref 70–99)
Potassium: 4.5 mmol/L (ref 3.5–5.1)
Sodium: 142 mmol/L (ref 135–145)
Total Bilirubin: 0.8 mg/dL (ref 0.3–1.2)
Total Protein: 6.6 g/dL (ref 6.5–8.1)

## 2020-03-13 LAB — CBC WITH DIFFERENTIAL/PLATELET
Abs Immature Granulocytes: 0.02 10*3/uL (ref 0.00–0.07)
Basophils Absolute: 0.1 10*3/uL (ref 0.0–0.1)
Basophils Relative: 1 %
Eosinophils Absolute: 0.3 10*3/uL (ref 0.0–0.5)
Eosinophils Relative: 2 %
HCT: 42.8 % (ref 39.0–52.0)
Hemoglobin: 14.4 g/dL (ref 13.0–17.0)
Immature Granulocytes: 0 %
Lymphocytes Relative: 29 %
Lymphs Abs: 3.5 10*3/uL (ref 0.7–4.0)
MCH: 30.5 pg (ref 26.0–34.0)
MCHC: 33.6 g/dL (ref 30.0–36.0)
MCV: 90.7 fL (ref 80.0–100.0)
Monocytes Absolute: 0.8 10*3/uL (ref 0.1–1.0)
Monocytes Relative: 7 %
Neutro Abs: 7.5 10*3/uL (ref 1.7–7.7)
Neutrophils Relative %: 61 %
Platelets: 269 10*3/uL (ref 150–400)
RBC: 4.72 MIL/uL (ref 4.22–5.81)
RDW: 12.8 % (ref 11.5–15.5)
WBC: 12.1 10*3/uL — ABNORMAL HIGH (ref 4.0–10.5)
nRBC: 0 % (ref 0.0–0.2)

## 2020-03-13 LAB — PROTIME-INR
INR: 2.6 — ABNORMAL HIGH (ref 0.8–1.2)
Prothrombin Time: 27.1 seconds — ABNORMAL HIGH (ref 11.4–15.2)

## 2020-03-13 NOTE — ED Provider Notes (Signed)
[facility] EMERGENCY DEPARTMENT Provider Note   CSN: [id number] Arrival date & time: 03/13/20  1427     History Chief Complaint  Patient presents with  . Circulatory Problem    [name] is a 64 y.o. male.  The history is provided by the patient.  Illness Location:  Right arm Quality:  Pain, bruising Severity:  Mild Onset quality:  Gradual Timing:  Intermittent Progression:  Waxing and waning Chronicity:  New Context:  Right arm pain, no trauma history, hx of afib on coumadin. Hx of right subclavian stenosis. Associated symptoms: no abdominal pain, no chest pain, no cough, no ear pain, no fever, no rash, no shortness of breath, no sore throat and no vomiting        Past Medical History:  Diagnosis Date  . Carotid artery occlusion    Right Carotid Bruit  . Chest pain 2003   neg evaluation  . Coronary artery disease    a. 12/10/16 PCI with DES--> RCA, normal EF  . History of kidney stones   . HTN (hypertension)   . Hyperlipemia   . Leg pain    a. ABIs (12/10):  Normal  . Paroxysmal atrial fibrillation (HCC)   . Subclavian steal syndrome Aug. 2015   Occult Right    Patient Active Problem List   Diagnosis Date Noted  . Encounter for therapeutic drug monitoring 02/06/2017  . Postoperative groin pseudoaneurysm 01/03/2017  . Groin hematoma 12/18/2016  . Coronary artery disease 12/14/2016  . Chest pain 12/10/2016  . Stable angina (HCC) 12/10/2016  . Unstable angina (HCC)   . Tinnitus of left ear 09/11/2014  . PVD (peripheral vascular disease) with claudication (HCC) 08/16/2014  . PAROXYSMAL ATRIAL FIBRILLATION 07/21/2010  . PALPITATIONS 07/13/2010  . RASH-NONVESICULAR 10/04/2008  . POLYURIA 10/04/2008  . RENAL CYST 04/08/2008  . Mixed hyperlipidemia 03/15/2008  . ERECTILE DYSFUNCTION 03/15/2008  . Tobacco use disorder 03/15/2008  . Essential hypertension 03/15/2008  . OTHER ACNE 11/04/2007  . ROTATOR CUFF REPAIR, RIGHT, HX OF  11/04/2007    Past Surgical History:  Procedure Laterality Date  . CARDIAC CATHETERIZATION N/A 12/10/2016   Procedure: Left Heart Cath and Coronary Angiography;  Surgeon: Yvonne Kendall, MD;  Location[facility] INVASIVE CV LAB;  Service: Cardiovascular;  Laterality: N/A;  . CARDIAC CATHETERIZATION N/A 12/10/2016   Procedure: Coronary Stent Intervention;  Surgeon: Yvonne Kendall, MD;  Location: [facility] INVASIVE CV LAB;  Service: Cardiovascular;  Laterality: N/A;  . CARDIAC CATHETERIZATION N/A 12/10/2016   Procedure: Intravascular Ultrasound/IVUS;  Surgeon: Yvonne Kendall, MD;  Location: [facility] INVASIVE CV LAB;  Service: Cardiovascular;  Laterality: N/A;  . COLONOSCOPY  10/2007   negative results  . COLONOSCOPY W/ POLYPECTOMY  2003  . CORONARY ANGIOPLASTY WITH STENT PLACEMENT  12/10/2016  . CYSTECTOMY     removed from back  . epidural steroids     in the back x3  . JOINT REPLACEMENT Right February 29, 1976   KNEE  . REPAIR PATELLAR TENDON Right   . SHOULDER ARTHROSCOPY WITH OPEN ROTATOR CUFF REPAIR Right        Family History  Problem Relation Age of Onset  . Heart disease Father        After age 79  . Heart attack Father   . Cancer Brother   . Stroke Brother     Social History   Tobacco Use  . Smoking status: Former Smoker    Packs/day: 0.50    Years: 27.00    Pack  years: 13.50    Types: Cigarettes    Quit date: 12/07/2016    Years since quitting: 3.2  . Smokeless tobacco: Never Used  . Tobacco comment: one week ago  Substance Use Topics  . Alcohol use: Yes    Comment: 12/10/2016 "if I have a beer once/month that's alot"  . Drug use: No    Home Medications Prior to Admission medications   Medication Sig Start Date End Date Taking? Authorizing Provider  atorvastatin (LIPITOR) 40 MG tablet TAKE 1 TABLET BY MOUTH ONCE DAILY AT  [facility] 04/02/19   Corky Crafts, MD  clopidogrel (PLAVIX) 75 MG tablet Take 1 tablet by mouth once daily 06/30/19   Corky Crafts, MD  doxycycline  (VIBRA-TABS) 100 MG tablet Take 100 mg by mouth daily. 02/12/19   [provider]  lisinopril (ZESTRIL) 10 MG tablet Take 1 tablet by mouth once daily 04/02/19   Bhagat, [place], [place]  metoprolol tartrate (LOPRESSOR) 25 MG tablet Take 1 tablet by mouth twice daily 04/03/19   Corky Crafts, MD  nitroGLYCERIN (NITROSTAT) 0.4 MG SL tablet Place 1 tablet (0.4 mg total) under the tongue every 5 (five) minutes as needed for chest pain. 03/03/18 02/27/19  Manson Passey, PA  ranitidine (ZANTAC) 150 MG tablet Take 150 mg by mouth daily as needed for heartburn.    [provider]  warfarin (COUMADIN) 5 MG tablet USE AS DIRECTED BY COUMADIN CLINIC 01/21/20   End, Cristal Deer, MD    Allergies    Tetanus toxoid  Review of Systems   Review of Systems  Constitutional: Negative for chills and fever.  HENT: Negative for ear pain and sore throat.   Eyes: Negative for pain and visual disturbance.  Respiratory: Negative for cough and shortness of breath.   Cardiovascular: Negative for chest pain and palpitations.  Gastrointestinal: Negative for abdominal pain and vomiting.  Genitourinary: Negative for dysuria and hematuria.  Musculoskeletal: Negative for arthralgias and back pain.       Right forearm pain   Skin: Positive for color change (bruising to right forearm). Negative for rash.  Neurological: Negative for seizures and syncope.  All other systems reviewed and are negative.   Physical Exam Updated Vital Signs BP (!) 98/47 (BP Location: Left Arm)   Pulse (!) 59   Temp 98 F (36.7 C) (Oral)   Resp 16   SpO2 99%   Physical Exam Vitals and nursing note reviewed.  Constitutional:      General: He is not in acute distress.    Appearance: He is well-developed. He is not ill-appearing.  HENT:     Head: Normocephalic and atraumatic.     Nose: Nose normal.     Mouth/Throat:     Mouth: Mucous membranes are moist.  Eyes:     Extraocular Movements: Extraocular movements  intact.     Conjunctiva/sclera: Conjunctivae normal.     Pupils: Pupils are equal, round, and reactive to light.  Cardiovascular:     Rate and Rhythm: Normal rate and regular rhythm.     Pulses: Normal pulses.     Heart sounds: Normal heart sounds. No murmur.  Pulmonary:     Effort: Pulmonary effort is normal. No respiratory distress.     Breath sounds: Normal breath sounds.  Abdominal:     Palpations: Abdomen is soft.     Tenderness: There is no abdominal tenderness.  Musculoskeletal:        General: Tenderness (right forearm) present. Normal range of motion.  Cervical back: Normal range of motion and neck supple.  Skin:    General: Skin is warm and dry.     Capillary Refill: Capillary refill takes less than 2 seconds.     Findings: Bruising (right forearm) present.  Neurological:     General: No focal deficit present.     Mental Status: He is alert and oriented to person, place, and time.     Cranial Nerves: No cranial nerve deficit.     Sensory: No sensory deficit.     Motor: No weakness.     Coordination: Coordination normal.     Comments: 5+/5 strength, normal sensation in RUE/LUE      ED Results / Procedures / Treatments   Labs (all labs ordered are listed, but only abnormal results are displayed) Labs Reviewed  CBC WITH DIFFERENTIAL/PLATELET - Abnormal; Notable for the following components:      Result Value   WBC 12.1 (*)    All other components within normal limits  PROTIME-INR - Abnormal; Notable for the following components:   Prothrombin Time 27.1 (*)    INR 2.6 (*)    All other components within normal limits  COMPREHENSIVE METABOLIC PANEL    EKG None  Radiology No results found.  Procedures Procedures (including critical care time)  Medications Ordered in ED Medications - No data to display  ED Course  I have reviewed the triage vital signs and the nursing notes.  Pertinent labs & imaging results that were available during my care of the  patient were reviewed by me and considered in my medical decision making (see chart for details).    MDM Rules/Calculators/A&P                      [name] is a 64 year old male with history of hypertension, A. fib on Coumadin, CAD status post stent, right subclavian stenosis who presents to the ED with right forearm pain.  Patient with normal vitals.  No fever.  Pain for the last week or so worse with movement.  Denies any trauma.  Has a history of stenosis in the right subclavian.  Was seen by vascular surgery in the past for this.  Was sent by cardiology in urgent care to rule out arterial process.  Patient has had some bruising to the right forearm during this time as well.  Patient denies any dizziness or other neurological symptoms.  No numbness or weakness in the hand.  Patient has normal strength and sensation to the right upper extremity.  Strong radial pulse.  Good capillary refill.  Has some mild bruising to the right forearm.  Lab work showed no significant anemia, electrolyte abnormality, kidney injury.  Platelets normal.  INR at goal at 2.6.  No concern for DVT as there is no swelling of the arm.  Anticoagulated already.  Upon chart review CTA of the neck in 2015 showed 60% stenosis of the right subclavian.  Possible that this is vascular related however no acute occlusion given history and physical.  Talked with Dr. Carlis Abbott w/ vascular surgery to arrange close outpatient visit.  He will arrange for patient to get some diagnostic imaging prior to his follow-up with vascular surgery.  Patient understands return precautions including worsening pain, weakness, numbness, color change to the right upper extremity.  Possible that this could be a muscle related pain recommend Tylenol and ice.  No chest pain, unremarkable EKG.  No concern for ACS.  No neurological symptoms and  no concern for stroke.  Patient was given education and reassurance and discharged in the ED in good condition.   Understands return precautions.  This chart was dictated using voice recognition software.  Despite best efforts to proofread,  errors can occur which can change the documentation meaning.    Final Clinical Impression(s) / ED Diagnoses Final diagnoses:  Pain of right upper extremity    Rx / DC Orders ED Discharge Orders    None       Virgina Norfolk, DO 03/13/20 1619

## 2020-03-13 NOTE — Discharge Instructions (Addendum)
Please return to the ED if your symptoms worsen including worsening pain, weakness, numbness to the right upper extremity.  Follow-up with vascular surgery.  Recommend continued use of Tylenol and ice in case that this is a forearm strain.

## 2020-03-13 NOTE — Telephone Encounter (Signed)
    The patient called the after-hours line reporting pain along his right arm. He says his right arm has felt weak all week and starting yesterday he began to experience bruising which was going from his wrist to his elbow. Denies any chest pain, palpitations or dyspnea. By review of his chart, he does have a history of right subclavian artery stenosis which was followed by Vascular Surgery in 2016 but he was unaware of this and was suppose to follow-up in 1 year but has not been evaluated since.  A family member who is a nurse came to evaluate the patient and he is planning to go to Urgent Care today. I agree with in person evaluation at Urgent Care. If findings are unrevealing, I did recommend he reach out to [place] as he would likely need a referral back to Vascular given his underlying subclavian stenosis.   He voiced understanding of this and was appreciative of the return call.   Signed, Ellsworth Lennox, PA-C 03/13/2020, 12:10 PM Pager: [phone]

## 2020-03-13 NOTE — ED Triage Notes (Signed)
Pt arrives to ED with c/o bruising to right forearm that he notified 1 week ago. Pt states another one appeared yesterday and he has had no injury. +2 radial pulse and good cap refill. Pt was sent here from and UC to r/o limb ischemia.

## 2020-03-22 ENCOUNTER — Emergency Department (HOSPITAL_COMMUNITY)
Admission: EM | Admit: 2020-03-22 | Discharge: 2020-03-22 | Disposition: A | Discharge status: Home / Self Care | Payer: Self-pay | Attending: Emergency Medicine | Admitting: Emergency Medicine

## 2020-03-22 ENCOUNTER — Other Ambulatory Visit: Payer: Self-pay | Admitting: Physician Assistant

## 2020-03-22 ENCOUNTER — Emergency Department (HOSPITAL_COMMUNITY): Payer: Self-pay

## 2020-03-22 ENCOUNTER — Other Ambulatory Visit: Payer: Self-pay | Admitting: Interventional Cardiology

## 2020-03-22 ENCOUNTER — Encounter (HOSPITAL_COMMUNITY): Payer: Self-pay | Admitting: Emergency Medicine

## 2020-03-22 ENCOUNTER — Other Ambulatory Visit: Payer: Self-pay

## 2020-03-22 DIAGNOSIS — S56911A Strain of unspecified muscles, fascia and tendons at forearm level, right arm, initial encounter: Secondary | ICD-10-CM | POA: Insufficient documentation

## 2020-03-22 DIAGNOSIS — I10 Essential (primary) hypertension: Secondary | ICD-10-CM

## 2020-03-22 DIAGNOSIS — E785 Hyperlipidemia, unspecified: Secondary | ICD-10-CM

## 2020-03-22 DIAGNOSIS — I739 Peripheral vascular disease, unspecified: Secondary | ICD-10-CM | POA: Insufficient documentation

## 2020-03-22 DIAGNOSIS — Y9389 Activity, other specified: Secondary | ICD-10-CM | POA: Insufficient documentation

## 2020-03-22 DIAGNOSIS — Y999 Unspecified external cause status: Secondary | ICD-10-CM | POA: Insufficient documentation

## 2020-03-22 DIAGNOSIS — Y9289 Other specified places as the place of occurrence of the external cause: Secondary | ICD-10-CM | POA: Insufficient documentation

## 2020-03-22 DIAGNOSIS — T148XXA Other injury of unspecified body region, initial encounter: Secondary | ICD-10-CM

## 2020-03-22 DIAGNOSIS — Z7901 Long term (current) use of anticoagulants: Secondary | ICD-10-CM | POA: Insufficient documentation

## 2020-03-22 DIAGNOSIS — Z79899 Other long term (current) drug therapy: Secondary | ICD-10-CM | POA: Insufficient documentation

## 2020-03-22 DIAGNOSIS — X503XXA Overexertion from repetitive movements, initial encounter: Secondary | ICD-10-CM | POA: Insufficient documentation

## 2020-03-22 DIAGNOSIS — Z955 Presence of coronary angioplasty implant and graft: Secondary | ICD-10-CM | POA: Insufficient documentation

## 2020-03-22 DIAGNOSIS — R937 Abnormal findings on diagnostic imaging of other parts of musculoskeletal system: Secondary | ICD-10-CM | POA: Insufficient documentation

## 2020-03-22 LAB — CBC WITH DIFFERENTIAL/PLATELET
Abs Immature Granulocytes: 0.06 10*3/uL (ref 0.00–0.07)
Basophils Absolute: 0.1 10*3/uL (ref 0.0–0.1)
Basophils Relative: 1 %
Eosinophils Absolute: 0.3 10*3/uL (ref 0.0–0.5)
Eosinophils Relative: 2 %
HCT: 44.2 % (ref 39.0–52.0)
Hemoglobin: 14.4 g/dL (ref 13.0–17.0)
Immature Granulocytes: 1 %
Lymphocytes Relative: 32 %
Lymphs Abs: 4.2 10*3/uL — ABNORMAL HIGH (ref 0.7–4.0)
MCH: 29.9 pg (ref 26.0–34.0)
MCHC: 32.6 g/dL (ref 30.0–36.0)
MCV: 91.7 fL (ref 80.0–100.0)
Monocytes Absolute: 0.7 10*3/uL (ref 0.1–1.0)
Monocytes Relative: 6 %
Neutro Abs: 7.7 10*3/uL (ref 1.7–7.7)
Neutrophils Relative %: 58 %
Platelets: 238 10*3/uL (ref 150–400)
RBC: 4.82 MIL/uL (ref 4.22–5.81)
RDW: 13 % (ref 11.5–15.5)
WBC: 13 10*3/uL — ABNORMAL HIGH (ref 4.0–10.5)
nRBC: 0 % (ref 0.0–0.2)

## 2020-03-22 LAB — COMPREHENSIVE METABOLIC PANEL
ALT: 30 U/L (ref 0–44)
AST: 30 U/L (ref 15–41)
Albumin: 4 g/dL (ref 3.5–5.0)
Alkaline Phosphatase: 57 U/L (ref 38–126)
Anion gap: 7 (ref 5–15)
BUN: 10 mg/dL (ref 8–23)
CO2: 25 mmol/L (ref 22–32)
Calcium: 9.5 mg/dL (ref 8.9–10.3)
Chloride: 109 mmol/L (ref 98–111)
Creatinine, Ser: 0.74 mg/dL (ref 0.61–1.24)
GFR calc Af Amer: >60 mL/min (ref >60)
GFR calc non Af Amer: >60 mL/min (ref >60)
Glucose, Bld: 102 mg/dL — ABNORMAL HIGH (ref 70–99)
Potassium: 5 mmol/L (ref 3.5–5.1)
Sodium: 141 mmol/L (ref 135–145)
Total Bilirubin: 0.8 mg/dL (ref 0.3–1.2)
Total Protein: 6.5 g/dL (ref 6.5–8.1)

## 2020-03-22 LAB — CK: Total CK: 63 U/L (ref 49–397)

## 2020-03-22 LAB — PROTIME-INR
INR: 2.4 — ABNORMAL HIGH (ref 0.8–1.2)
Prothrombin Time: 25.6 seconds — ABNORMAL HIGH (ref 11.4–15.2)

## 2020-03-22 MED ORDER — HYDROCODONE-ACETAMINOPHEN 5-325 MG PO TABS: 1.0000 | ORAL_TABLET | Freq: Two times a day (BID) | ORAL | 0 refills | Status: DC | PRN

## 2020-03-22 NOTE — ED Notes (Signed)
Pt transported to MRI 

## 2020-03-22 NOTE — ED Notes (Signed)
Pt back from MRI 

## 2020-03-22 NOTE — ED Provider Notes (Signed)
[facility] EMERGENCY DEPARTMENT Provider Note   CSN: [id number] Arrival date & time: 03/22/20  4944     History Chief Complaint  Patient presents with  . Arm Pain    [name] is a 64 y.o. male with a past medical history of peripheral vascular disease, subclavian steal syndrome secondary to subclavian artery stenosis, history of CAD status post drug-eluting stent, hyperlipidemia, paroxysmal atrial fibrillation chronically anticoagulated on both warfarin and clopidogrel.  Patient still intermittently smokes cigarettes.  He was seen at the emergency department for evaluation of right arm pain on 03/13/2020.  Patient was referred to vascular surgery and has a follow-up appointment on the 24th.  He states that his pain has gotten worse and he "wants some answers."  The patient says that 2 weeks ago he spent the day waxing and buffing his car.  The next day he developed severe right forearm pain.  He states that the pain gradually worsened over the week and approximately 1 week after onset of pain he developed severe bruising over the arm spontaneously.  He states that the pain hurts all the time but is a little bit better during the daytime.  He has gotten terrible sleep because it keeps him awake all night long.  The pain is worse whenever he tries to move his wrist.  He does not have pain with movement of the elbow or shoulder.  He denies paresthesia but complains of weakness secondary to the severe pain of his forearm.  He has a history of previous right rotator cuff repair  HPI     Past Medical History:  Diagnosis Date  . Carotid artery occlusion    Right Carotid Bruit  . Chest pain 2003   neg evaluation  . Coronary artery disease    a. 12/10/16 PCI with DES--> RCA, normal EF  . History of kidney stones   . HTN (hypertension)   . Hyperlipemia   . Leg pain    a. ABIs (12/10):  Normal  . Paroxysmal atrial fibrillation (HCC)   . Subclavian steal syndrome Aug.  2015   Occult Right    Patient Active Problem List   Diagnosis Date Noted  . Encounter for therapeutic drug monitoring 02/06/2017  . Postoperative groin pseudoaneurysm 01/03/2017  . Groin hematoma 12/18/2016  . Coronary artery disease 12/14/2016  . Chest pain 12/10/2016  . Stable angina (HCC) 12/10/2016  . Unstable angina (HCC)   . Tinnitus of left ear 09/11/2014  . PVD (peripheral vascular disease) with claudication (HCC) 08/16/2014  . PAROXYSMAL ATRIAL FIBRILLATION 07/21/2010  . PALPITATIONS 07/13/2010  . RASH-NONVESICULAR 10/04/2008  . POLYURIA 10/04/2008  . RENAL CYST 04/08/2008  . Mixed hyperlipidemia 03/15/2008  . ERECTILE DYSFUNCTION 03/15/2008  . Tobacco use disorder 03/15/2008  . Essential hypertension 03/15/2008  . OTHER ACNE 11/04/2007  . ROTATOR CUFF REPAIR, RIGHT, HX OF 11/04/2007    Past Surgical History:  Procedure Laterality Date  . CARDIAC CATHETERIZATION N/A 12/10/2016   Procedure: Left Heart Cath and Coronary Angiography;  Surgeon: Yvonne Kendall, MD;  Location: [facility] INVASIVE CV LAB;  Service: Cardiovascular;  Laterality: N/A;  . CARDIAC CATHETERIZATION N/A 12/10/2016   Procedure: Coronary Stent Intervention;  Surgeon: Yvonne Kendall, MD;  Location: [facility] INVASIVE CV LAB;  Service: Cardiovascular;  Laterality: N/A;  . CARDIAC CATHETERIZATION N/A 12/10/2016   Procedure: Intravascular Ultrasound/IVUS;  Surgeon: Yvonne Kendall, MD;  Location: [facility] INVASIVE CV LAB;  Service: Cardiovascular;  Laterality: N/A;  . COLONOSCOPY  10/2007  negative results  . COLONOSCOPY W/ POLYPECTOMY  2003  . CORONARY ANGIOPLASTY WITH STENT PLACEMENT  12/10/2016  . CYSTECTOMY     removed from back  . epidural steroids     in the back x3  . JOINT REPLACEMENT Right February 29, 1976   KNEE  . REPAIR PATELLAR TENDON Right   . SHOULDER ARTHROSCOPY WITH OPEN ROTATOR CUFF REPAIR Right        Family History  Problem Relation Age of Onset  . Heart disease Father        After age 28  .  Heart attack Father   . Cancer Brother   . Stroke Brother     Social History   Tobacco Use  . Smoking status: Former Smoker    Packs/day: 0.50    Years: 27.00    Pack years: 13.50    Types: Cigarettes    Quit date: 12/07/2016    Years since quitting: 3.2  . Smokeless tobacco: Never Used  . Tobacco comment: one week ago  Substance Use Topics  . Alcohol use: Yes    Comment: 12/10/2016 "if I have a beer once/month that's alot"  . Drug use: No    Home Medications Prior to Admission medications   Medication Sig Start Date End Date Taking? Authorizing Provider  atorvastatin (LIPITOR) 40 MG tablet TAKE 1 TABLET BY MOUTH ONCE DAILY AT  [facility] 04/02/19   Corky Crafts, MD  clopidogrel (PLAVIX) 75 MG tablet Take 1 tablet by mouth once daily 06/30/19   Corky Crafts, MD  doxycycline (VIBRA-TABS) 100 MG tablet Take 100 mg by mouth daily. 02/12/19   [provider]  lisinopril (ZESTRIL) 10 MG tablet Take 1 tablet by mouth once daily 04/02/19   Bhagat, [place], PA  metoprolol tartrate (LOPRESSOR) 25 MG tablet Take 1 tablet by mouth twice daily 04/03/19   Corky Crafts, MD  nitroGLYCERIN (NITROSTAT) 0.4 MG SL tablet Place 1 tablet (0.4 mg total) under the tongue every 5 (five) minutes as needed for chest pain. 03/03/18 02/27/19  Manson Passey, PA  ranitidine (ZANTAC) 150 MG tablet Take 150 mg by mouth daily as needed for heartburn.    [provider]  warfarin (COUMADIN) 5 MG tablet USE AS DIRECTED BY COUMADIN CLINIC 01/21/20   End, Cristal Deer, MD    Allergies    Tetanus toxoid  Review of Systems   Review of Systems Ten systems reviewed and are negative for acute change, except as noted in the HPI.   Physical Exam Updated Vital Signs BP (!) 153/85 (BP Location: Left Arm)   Pulse 64   Temp 98.4 F (36.9 C) (Oral)   Resp 14   Ht 5\' 8"  (1.727 m)   Wt 74.8 kg   SpO2 99%   BMI 25.09 kg/m   Physical Exam Vitals and nursing note reviewed.    Constitutional:      General: He is not in acute distress.    Appearance: He is well-developed. He is not diaphoretic.  HENT:     Head: Normocephalic and atraumatic.  Eyes:     General: No scleral icterus.    Conjunctiva/sclera: Conjunctivae normal.  Cardiovascular:     Rate and Rhythm: Normal rate and regular rhythm.     Heart sounds: Normal heart sounds.     Comments: 2+ radial and brachial pulse on the right  Less than 2-second capillary in the upper extremities refill bilaterally Pulmonary:     Effort: Pulmonary effort is normal. No  respiratory distress.     Breath sounds: Normal breath sounds.  Abdominal:     Palpations: Abdomen is soft.     Tenderness: There is no abdominal tenderness.  Musculoskeletal:     Right forearm: Tenderness present.     Cervical back: Normal range of motion and neck supple.     Comments: Right forearm exquisitely tender to palpation especially on along the volar side of the forearm predominantly the radial aspect.  There is bruising proximally to the medial condyle.  There is obvious discrepancy between the visible tendons of the right volar wrist and the left.  Patient has visible flexor carpi radialis and palmaris longus on the left wrist.  These are not visible on the right side.  Severe pain with wrist flexion against pressure and also with passive extension of the wrist  Skin:    General: Skin is warm and dry.  Neurological:     Mental Status: He is alert.  Psychiatric:        Behavior: Behavior normal.     ED Results / Procedures / Treatments   Labs (all labs ordered are listed, but only abnormal results are displayed) Labs Reviewed - No data to display  EKG None  Radiology No results found.  Procedures Procedures (including critical care time)  Medications Ordered in ED Medications - No data to display  ED Course  I have reviewed the triage vital signs and the nursing notes.  Pertinent labs & imaging results that were  available during my care of the patient were reviewed by me and considered in my medical decision making (see chart for details).    MDM Rules/Calculators/A&P                      64 year old male with severe right arm pain.  Differential includes subclavian steal syndrome, neuropathy, tendon tear or rupture, arthritis, bursitis.  His orthopedic exam of the shoulder and elbow are within normal limits.  Strong pulses radial pulses without evidence of arterial process.  Patient had some swelling over the volar surface with pain in the forearm.  Patient stated that he did not want to leave without knowing what was going on we got an MRI of the forearm which shows hematoma within the muscle belly consistent with muscle tear.  I personally reviewed these images.  I discussed the findings with the patient.  I do think he needs to continue seeing his vascular doctor as scheduled.  He was placed in a forearm splint.  I ordered 10 Norco which she may take at night for sleep when his pain is severe.  He does not have any evidence of neuropathy secondary to swelling.  No evidence of surgical process such as acute tendon rupture.  Patient appears otherwise appropriate for discharge at this time with pain control and close outpatient follow-up.  Discussed return precaution. Final Clinical Impression(s) / ED Diagnoses Final diagnoses:  None    Rx / DC Orders ED Discharge Orders    None       Margarita Mail, PA-C 03/22/20 [place], [place], DO 03/25/20 1633

## 2020-03-22 NOTE — Discharge Instructions (Signed)
Wear your forearm splint at all times unless showering to give the muscle time to rest.  You may apply ice to the affected area.  Try to use your left hand as much as possible.  Return if you have severe swelling, pain that is increasing in severity, whiteness of the fingers.  Follow closely with Dr. Amanda Pea if your pain is not getting any better for further evaluation.  You should maintain your appointment with the vascular surgeon for evaluation of your subclavian steal syndrome.

## 2020-03-22 NOTE — Progress Notes (Signed)
Orthopedic Tech Progress Note Patient Details:  [name] [birth year] [id number]  Ortho Devices Type of Ortho Device: Velcro wrist splint Ortho Device/Splint Location: RUE Ortho Device/Splint Interventions: Ordered, Application   Post Interventions Patient Tolerated: Well Instructions Provided: Adjustment of device, Care of device   [name] 03/22/2020, 3:03 PM

## 2020-03-22 NOTE — ED Triage Notes (Signed)
Pt arrives to ED with same c/o that has been ongoing for 2 weeks. pt was seen here on 5/2 for same- pt is no better- Pt has new bruising to right elbow and pain that keeps him away awake at night. Pain seems to come and go- pt states when he woke up this morning he thought his arm looked purple and swollen. Pt has equal radial pulses. Intact sensation and movement.

## 2020-03-22 NOTE — ED Notes (Signed)
Patient given discharge instructions patient verbalizes understanding. 

## 2020-03-29 ENCOUNTER — Other Ambulatory Visit: Payer: Self-pay | Admitting: *Deleted

## 2020-03-29 DIAGNOSIS — M79601 Pain in right arm: Secondary | ICD-10-CM

## 2020-03-29 DIAGNOSIS — I739 Peripheral vascular disease, unspecified: Secondary | ICD-10-CM

## 2020-04-04 ENCOUNTER — Ambulatory Visit (INDEPENDENT_AMBULATORY_CARE_PROVIDER_SITE_OTHER): Payer: Self-pay | Admitting: Surgery

## 2020-04-04 ENCOUNTER — Other Ambulatory Visit: Payer: Self-pay

## 2020-04-04 ENCOUNTER — Ambulatory Visit (INDEPENDENT_AMBULATORY_CARE_PROVIDER_SITE_OTHER)
Admission: RE | Admit: 2020-04-04 | Discharge: 2020-04-04 | Disposition: A | Discharge status: Home / Self Care | Payer: Self-pay | Source: Ambulatory Visit | Attending: Surgery | Admitting: Surgery

## 2020-04-04 ENCOUNTER — Telehealth: Payer: Self-pay | Admitting: Licensed Clinical Social Worker

## 2020-04-04 ENCOUNTER — Ambulatory Visit (HOSPITAL_COMMUNITY)
Admission: RE | Admit: 2020-04-04 | Discharge: 2020-04-04 | Disposition: A | Discharge status: Home / Self Care | Payer: Self-pay | Source: Ambulatory Visit | Attending: Surgery | Admitting: Surgery

## 2020-04-04 ENCOUNTER — Encounter: Payer: Self-pay | Admitting: Surgery

## 2020-04-04 VITALS — BP 133/85 | HR 62 | Temp 98.0°F | Resp 20 | Ht 68.0 in | Wt 173.0 lb

## 2020-04-04 DIAGNOSIS — I739 Peripheral vascular disease, unspecified: Secondary | ICD-10-CM

## 2020-04-04 DIAGNOSIS — M79601 Pain in right arm: Secondary | ICD-10-CM

## 2020-04-04 NOTE — Progress Notes (Signed)
[facility] [place]  Patient name: [name] MRN: [id number] DOB: 11-07-1956 Sex: male   REASON FOR VISIT:    Follow up  HISOTRY OF PRESENT ILLNESS:    [name] is a 64 y.o. male who I last saw in 2016 for right subclavian artery stenosis.  At that time, he had a CT scan that showed 60% right subclavian stenosis which was asymptomatic.  He was lost to follow-up.  He recently had a injury to his right forearm with intramuscular hematoma.  He was referred back to our office.  Since I have seen him, he has had a stent placed in his right coronary artery.  He did quit smoking for a while but has occasionally had a cigarette.  He is medically managed for hypertension and takes a statin for hypercholesterolemia.  He is on dual antiplatelet therapy.   PAST MEDICAL HISTORY:   Past Medical History:  Diagnosis Date  . Carotid artery occlusion    Right Carotid Bruit  . Chest pain 2003   neg evaluation  . Coronary artery disease    a. 12/10/16 PCI with DES--> RCA, normal EF  . History of kidney stones   . HTN (hypertension)   . Hyperlipemia   . Leg pain    a. ABIs (12/10):  Normal  . Paroxysmal atrial fibrillation (HCC)   . Subclavian steal syndrome Aug. 2015   Occult Right     FAMILY HISTORY:   Family History  Problem Relation Age of Onset  . Heart disease Father        After age 68  . Heart attack Father   . Cancer Brother   . Stroke Brother     SOCIAL HISTORY:   Social History   Tobacco Use  . Smoking status: Current Some Day Smoker    Packs/day: 0.25    Years: 27.00    Pack years: 6.75    Types: Cigarettes  . Smokeless tobacco: Never Used  Substance Use Topics  . Alcohol use: Yes    Comment: 12/10/2016 "if I have a beer once/month that's alot"     ALLERGIES:   Allergies  Allergen Reactions  . Tetanus Toxoid Other (See Comments)    Convulsions and fever     CURRENT MEDICATIONS:     Current Outpatient Medications  Medication Sig Dispense Refill  . atorvastatin (LIPITOR) 40 MG tablet TAKE 1 TABLET BY MOUTH ONCE DAILY AT  6PM (Patient taking differently: Take 40 mg by mouth daily. ) 90 tablet 3  . clopidogrel (PLAVIX) 75 MG tablet Take 1 tablet by mouth once daily 90 tablet 3  . doxycycline (VIBRA-TABS) 100 MG tablet Take 100 mg by mouth daily.    [name] HYDROcodone-acetaminophen (NORCO) 5-325 MG tablet Take 1 tablet by mouth 3 times/day as needed-between meals & bedtime for severe pain. 10 tablet 0  . lisinopril (ZESTRIL) 10 MG tablet Take 1 tablet (10 mg total) by mouth daily. Please call and schedule an appt for further refills .[phone]. 1st attempt 30 tablet 0  . metoprolol tartrate (LOPRESSOR) 25 MG tablet Take 1 tablet (25 mg total) by mouth 2 (two) times daily. Please make overdue appt with Dr. Eldridge Dace before anymore refills. 1st attempt 60 tablet 0  . triamcinolone ointment (KENALOG) 0.1 % Apply 1 application topically daily as needed.    . warfarin (COUMADIN) 5 MG tablet USE AS DIRECTED BY COUMADIN CLINIC (Patient taking differently: Take 2.5-5 mg by mouth See admin instructions. Take  2.5 mg on Tues and Sat. Take 5 mg all other days.) 90 tablet 0  . nitroGLYCERIN (NITROSTAT) 0.4 MG SL tablet Place 1 tablet (0.4 mg total) under the tongue every 5 (five) minutes as needed for chest pain. 25 tablet 3   No current facility-administered medications for this visit.    REVIEW OF SYSTEMS:   [X]  denotes positive finding, [ ]  denotes negative finding Cardiac  Comments:  Chest pain or chest pressure:    Shortness of breath upon exertion:    Short of breath when lying flat:    Irregular heart rhythm:        Vascular    Pain in calf, thigh, or hip brought on by ambulation:    Pain in feet at night that wakes you up from your sleep:     Blood clot in your veins:    Leg swelling:         Pulmonary    Oxygen at home:    Productive cough:     Wheezing:          Neurologic    Sudden weakness in arms or legs:     Sudden numbness in arms or legs:     Sudden onset of difficulty speaking or slurred speech:    Temporary loss of vision in one eye:     Problems with dizziness:         Gastrointestinal    Blood in stool:     Vomited blood:         Genitourinary    Burning when urinating:     Blood in urine:        Psychiatric    Major depression:         Hematologic    Bleeding problems:    Problems with blood clotting too easily:        Skin    Rashes or ulcers:        Constitutional    Fever or chills:      PHYSICAL EXAM:   Vitals:   04/04/20 0914 04/04/20 0916  BP: 120/73 133/85  Pulse: 62   Resp: 20   Temp: 98 F (36.7 C)   SpO2: 97%   Weight: 173 lb (78.5 kg)   Height: 5\' 8"  (1.727 m)     GENERAL: The patient is a well-nourished male, in no acute distress. The vital signs are documented above. CARDIAC: There is a regular rate and rhythm.  VASCULAR: I could not palpate right brachial or radial pulse.  He has palpable pedal pulses. PULMONARY: Non-labored respirations ABDOMEN: Soft and non-tender with no pulsatile mass MUSCULOSKELETAL: There are no major deformities or cyanosis. NEUROLOGIC: No focal weakness or paresthesias are detected. SKIN: There are no ulcers or rashes noted. PSYCHIATRIC: The patient has a normal affect.  STUDIES:   I have reviewed the following:  Right arm duplex: Right: >50% stenosis visualized in the right upper extremity.     Obstruction noted in the subclavian artery.   Carotid duplex: Right Carotid: Velocities in the right ICA are consistent with a 1-39%  stenosis.         Non-hemodynamically significant plaque <50% noted in the  CCA. The         ECA appears <50% stenosed. Innominate artery: 150 cm/s.   Left Carotid: Velocities in the left ICA are consistent with a 1-39%  stenosis.        Non-hemodynamically significant plaque <50% noted in the  CCA. The  ECA appears <50% stenosed. Elevated velocities in the left  ECA may        be obscured by shadowing of adjacent structures.   Vertebrals: Left vertebral artery demonstrates antegrade flow. Right  vertebral        artery demontrates atypical flow patterns.  Subclavians: Right subclavian artery was stenotic. Normal flow  hemodynamics were        seen in the left subclavian artery.  MEDICAL ISSUES:   Right subclavian stenosis: The patient continues to be asymptomatic.  I do not feel this has anything to do with his right arm issues currently.  I have recommended follow-up surveillance in 2 years.  We discussed signs and symptoms of ischemia and to contact me should these occur.  I have again encouraged him to completely stop smoking.    Leia Alf, MD, FACS [facility] [phone] Pager [phone]

## 2020-04-04 NOTE — Telephone Encounter (Signed)
CSW received referral to assist patient who is uninsured. CSW contacted patient and left message for return call. Lasandra Beech, LCSW, CCSW-MCS [phone]

## 2020-04-13 ENCOUNTER — Telehealth: Payer: Self-pay | Admitting: Licensed Clinical Social Worker

## 2020-04-13 NOTE — Telephone Encounter (Signed)
CSW attempted again to reach patient to offer assistance for insurance options. Message left for return call. Lasandra Beech, LCSW, CCSW-MCS [phone]

## 2020-04-18 ENCOUNTER — Other Ambulatory Visit: Payer: Self-pay | Admitting: Interventional Cardiology

## 2020-04-18 DIAGNOSIS — E785 Hyperlipidemia, unspecified: Secondary | ICD-10-CM

## 2020-04-18 DIAGNOSIS — I10 Essential (primary) hypertension: Secondary | ICD-10-CM

## 2020-04-19 ENCOUNTER — Ambulatory Visit (INDEPENDENT_AMBULATORY_CARE_PROVIDER_SITE_OTHER): Payer: Self-pay

## 2020-04-19 ENCOUNTER — Other Ambulatory Visit: Payer: Self-pay

## 2020-04-19 DIAGNOSIS — Z5181 Encounter for therapeutic drug level monitoring: Secondary | ICD-10-CM

## 2020-04-19 DIAGNOSIS — I480 Paroxysmal atrial fibrillation: Secondary | ICD-10-CM

## 2020-04-19 LAB — POCT INR: INR: 2.2 (ref 2.0–3.0)

## 2020-04-19 NOTE — Patient Instructions (Signed)
Description   Continue taking 1 tablet daily except for 1/2 tablet on Tuesdays and Saturdays. Recheck in 8 weeks. Main # [phone] [facility]in [facility]# [phone]    

## 2020-05-04 ENCOUNTER — Encounter: Payer: Self-pay | Admitting: Physician Assistant

## 2020-05-04 ENCOUNTER — Ambulatory Visit (INDEPENDENT_AMBULATORY_CARE_PROVIDER_SITE_OTHER): Payer: Self-pay | Admitting: Physician Assistant

## 2020-05-04 ENCOUNTER — Other Ambulatory Visit: Payer: Self-pay

## 2020-05-04 VITALS — BP 108/70 | HR 73 | Ht 68.0 in | Wt 178.4 lb

## 2020-05-04 DIAGNOSIS — I10 Essential (primary) hypertension: Secondary | ICD-10-CM

## 2020-05-04 DIAGNOSIS — I480 Paroxysmal atrial fibrillation: Secondary | ICD-10-CM

## 2020-05-04 DIAGNOSIS — G458 Other transient cerebral ischemic attacks and related syndromes: Secondary | ICD-10-CM

## 2020-05-04 DIAGNOSIS — I2510 Atherosclerotic heart disease of native coronary artery without angina pectoris: Secondary | ICD-10-CM

## 2020-05-04 DIAGNOSIS — E785 Hyperlipidemia, unspecified: Secondary | ICD-10-CM

## 2020-05-04 LAB — LIPID PANEL
Chol/HDL Ratio: 2.9 ratio (ref 0.0–5.0)
Cholesterol, Total: 126 mg/dL (ref 100–199)
HDL: 43 mg/dL (ref >39)
LDL Chol Calc (NIH): 53 mg/dL (ref 0–99)
Triglycerides: 182 mg/dL — ABNORMAL HIGH (ref 0–149)
VLDL Cholesterol Cal: 30 mg/dL (ref 5–40)

## 2020-05-04 MED ORDER — NITROGLYCERIN 0.4 MG SL SUBL: 0.4000 mg | SUBLINGUAL_TABLET | SUBLINGUAL | 3 refills | Status: DC | PRN

## 2020-05-04 MED ORDER — CLOPIDOGREL BISULFATE 75 MG PO TABS: 75.0000 mg | ORAL_TABLET | Freq: Every day | ORAL | 3 refills | Status: DC

## 2020-05-04 MED ORDER — ATORVASTATIN CALCIUM 40 MG PO TABS: 40.0000 mg | ORAL_TABLET | Freq: Every day | ORAL | 3 refills | Status: DC

## 2020-05-04 MED ORDER — LISINOPRIL 10 MG PO TABS: 10.0000 mg | ORAL_TABLET | Freq: Every day | ORAL | 3 refills | Status: DC

## 2020-05-04 MED ORDER — METOPROLOL TARTRATE 25 MG PO TABS: 25.0000 mg | ORAL_TABLET | Freq: Two times a day (BID) | ORAL | 3 refills | Status: DC

## 2020-05-04 NOTE — Patient Instructions (Signed)
Medication Instructions:  Your physician recommends that you continue on your current medications as directed. Please refer to the Current Medication list given to you today.  *If you need a refill on your cardiac medications before your next appointment, please call your pharmacy*   Lab Work: TODAY: LIPIDS  If you have labs (blood work) drawn today and your tests are completely normal, you will receive your results only by: [name] MyChart Message (if you have MyChart) OR . A paper copy in the mail If you have any lab test that is abnormal or we need to change your treatment, we will call you to review the results.   Testing/Procedures: None   Follow-Up: At [facility], you and your health needs are our priority.  As part of our continuing mission to provide you with exceptional heart care, we have created designated Provider Care Teams.  These Care Teams include your primary Cardiologist (physician) and Advanced Practice Providers (APPs -  Physician Assistants and Nurse Practitioners) who all work together to provide you with the care you need, when you need it.  We recommend signing up for the patient portal called "MyChart".  Sign up information is provided on this After Visit Summary.  MyChart is used to connect with patients for Virtual Visits (Telemedicine).  Patients are able to view lab/test results, encounter notes, upcoming appointments, etc.  Non-urgent messages can be sent to your provider as well.   To learn more about what you can do with MyChart, go to [email or web address].    Your next appointment:   12 month(s)  The format for your next appointment:   In Person  Provider:   You may see Lance Muss, MD or one of the following Advanced Practice Providers on your designated Care Team:    Ronie Spies, PA-C  Jacolyn Reedy, PA-C    Other Instructions Your provider recommends that you maintain 150 minutes per week of moderate aerobic activity.   Steps to  Quit Smoking Smoking tobacco is the leading cause of preventable death. It can affect almost every organ in the body. Smoking puts you and people around you at risk for many serious, long-lasting (chronic) diseases. Quitting smoking can be hard, but it is one of the best things that you can do for your health. It is never too late to quit. How do I get ready to quit? When you decide to quit smoking, make a plan to help you succeed. Before you quit:  Pick a date to quit. Set a date within the next 2 weeks to give you time to prepare.  Write down the reasons why you are quitting. Keep this list in places where you will see it often.  Tell your family, friends, and co-workers that you are quitting. Their support is important.  Talk with your doctor about the choices that may help you quit.  Find out if your health insurance will pay for these treatments.  Know the people, places, things, and activities that make you want to smoke (triggers). Avoid them. What first steps can I take to quit smoking?  Throw away all cigarettes at home, at work, and in your car.  Throw away the things that you use when you smoke, such as ashtrays and lighters.  Clean your car. Make sure to empty the ashtray.  Clean your home, including curtains and carpets. What can I do to help me quit smoking? Talk with your doctor about taking medicines and seeing a counselor at the same time.  You are more likely to succeed when you do both.  If you are pregnant or breastfeeding, talk with your doctor about counseling or other ways to quit smoking. Do not take medicine to help you quit smoking unless your doctor tells you to do so. To quit smoking: Quit right away  Quit smoking totally, instead of slowly cutting back on how much you smoke over a period of time.  Go to counseling. You are more likely to quit if you go to counseling sessions regularly. Take medicine You may take medicines to help you quit. Some  medicines need a prescription, and some you can buy over-the-counter. Some medicines may contain a drug called nicotine to replace the nicotine in cigarettes. Medicines may:  Help you to stop having the desire to smoke (cravings).  Help to stop the problems that come when you stop smoking (withdrawal symptoms). Your doctor may ask you to use:  Nicotine patches, gum, or lozenges.  Nicotine inhalers or sprays.  Non-nicotine medicine that is taken by mouth. Find resources Find resources and other ways to help you quit smoking and remain smoke-free after you quit. These resources are most helpful when you use them often. They include:  Online chats with a Veterinary surgeon.  Phone quitlines.  Printed Materials engineer.  Support groups or group counseling.  Text messaging programs.  Mobile phone apps. Use apps on your mobile phone or tablet that can help you stick to your quit plan. There are many free apps for mobile phones and tablets as well as websites. Examples include Quit Guide from the [employer] and smokefree.gov  What things can I do to make it easier to quit?   Talk to your family and friends. Ask them to support and encourage you.  Call a phone quitline (1-800-QUIT-NOW), reach out to support groups, or work with a Veterinary surgeon.  Ask people who smoke to not smoke around you.  Avoid places that make you want to smoke, such as: ? Bars. ? Parties. ? Smoke-break areas at work.  Spend time with people who do not smoke.  Lower the stress in your life. Stress can make you want to smoke. Try these things to help your stress: ? Getting regular exercise. ? Doing deep-breathing exercises. ? Doing yoga. ? Meditating. ? Doing a body scan. To do this, close your eyes, focus on one area of your body at a time from head to toe. Notice which parts of your body are tense. Try to relax the muscles in those areas. How will I feel when I quit smoking? Day 1 to 3 weeks Within the first 24 hours,  you may start to have some problems that come from quitting tobacco. These problems are very bad 2-3 days after you quit, but they do not often last for more than 2-3 weeks. You may get these symptoms:  Mood swings.  Feeling restless, nervous, angry, or annoyed.  Trouble concentrating.  Dizziness.  Strong desire for high-sugar foods and nicotine.  Weight gain.  Trouble pooping (constipation).  Feeling like you may vomit (nausea).  Coughing or a sore throat.  Changes in how the medicines that you take for other issues work in your body.  Depression.  Trouble sleeping (insomnia). Week 3 and afterward After the first 2-3 weeks of quitting, you may start to notice more positive results, such as:  Better sense of smell and taste.  Less coughing and sore throat.  Slower heart rate.  Lower blood pressure.  Clearer skin.  Better  breathing.  Fewer sick days. Quitting smoking can be hard. Do not give up if you fail the first time. Some people need to try a few times before they succeed. Do your best to stick to your quit plan, and talk with your doctor if you have any questions or concerns. Summary  Smoking tobacco is the leading cause of preventable death. Quitting smoking can be hard, but it is one of the best things that you can do for your health.  When you decide to quit smoking, make a plan to help you succeed.  Quit smoking right away, not slowly over a period of time.  When you start quitting, seek help from your doctor, family, or friends. This information is not intended to replace advice given to you by your health care provider. Make sure you discuss any questions you have with your health care provider. Document Revised: 07/24/2019 Document Reviewed: 01/17/2019 Elsevier Patient Education  [place].

## 2020-05-04 NOTE — Progress Notes (Signed)
Cardiology Office Note    Date:  05/04/2020   ID:  [name], DOB [birth year], MRN [id number]  PCP:  Lorenda Ishihara, MD  Cardiologist: Lance Muss, MD EPS: None  No chief complaint on file.   History of Present Illness:  [name] is a 64 y.o. male with history of CAD S/P DES mRCA 11/2016 complicated by right groin pseudoaneurysm, mild diffuse disease in LAD and Cfx. Also has HTN, HLD, PAF on Coumadin, subclavian steal-followed by Dr. Myra Gianotti and was felt to be stable 04/04/2020  Patient last saw Dr. Eldridge Dace 02/2019 and was doing well.  Patient comes in for f/u. Denies chest pain, palpitations, dyspnea, dizziness or presyncope. Not exercising but bought a home gym. Hasn't used because he tore a muscle in his right arm. Starting to feel better. 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 Smokes about 3 cigarettes/day.  Past Medical History:  Diagnosis Date  . Carotid artery occlusion    Right Carotid Bruit  . Chest pain 2003   neg evaluation  . Coronary artery disease    a. 12/10/16 PCI with DES--> RCA, normal EF  . History of kidney stones   . HTN (hypertension)   . Hyperlipemia   . Leg pain    a. ABIs (12/10):  Normal  . Paroxysmal atrial fibrillation (HCC)   . Subclavian steal syndrome Aug. 2015   Occult Right    Past Surgical History:  Procedure Laterality Date  . CARDIAC CATHETERIZATION N/A 12/10/2016   Procedure: Left Heart Cath and Coronary Angiography;  Surgeon: Yvonne Kendall, MD;  Location: [facility] INVASIVE CV LAB;  Service: Cardiovascular;  Laterality: N/A;  . CARDIAC CATHETERIZATION N/A 12/10/2016   Procedure: Coronary Stent Intervention;  Surgeon: Yvonne Kendall, MD;  Location: [facility] INVASIVE CV LAB;  Service: Cardiovascular;  Laterality: N/A;  . CARDIAC CATHETERIZATION N/A 12/10/2016   Procedure: Intravascular Ultrasound/IVUS;  Surgeon: Yvonne Kendall, MD;  Location: [facility] INVASIVE CV LAB;  Service: Cardiovascular;  Laterality: N/A;  . COLONOSCOPY  10/2007     negative results  . COLONOSCOPY W/ POLYPECTOMY  2003  . CORONARY ANGIOPLASTY WITH STENT PLACEMENT  12/10/2016  . CYSTECTOMY     removed from back  . epidural steroids     in the back x3  . JOINT REPLACEMENT Right February 29, 1976   KNEE  . REPAIR PATELLAR TENDON Right   . SHOULDER ARTHROSCOPY WITH OPEN ROTATOR CUFF REPAIR Right     Current Medications: Current Meds  Medication Sig  . atorvastatin (LIPITOR) 40 MG tablet Take 1 tablet (40 mg total) by mouth daily.  . clopidogrel (PLAVIX) 75 MG tablet Take 1 tablet (75 mg total) by mouth daily.  [name] doxycycline (VIBRA-TABS) 100 MG tablet Take 100 mg by mouth daily.  [name] HYDROcodone-acetaminophen (NORCO) 5-325 MG tablet Take 1 tablet by mouth 3 times/day as needed-between meals & bedtime for severe pain.  [name] lisinopril (ZESTRIL) 10 MG tablet Take 1 tablet (10 mg total) by mouth daily.  . metoprolol tartrate (LOPRESSOR) 25 MG tablet Take 1 tablet (25 mg total) by mouth 2 (two) times daily.  . nitroGLYCERIN (NITROSTAT) 0.4 MG SL tablet Place 1 tablet (0.4 mg total) under the tongue every 5 (five) minutes as needed for chest pain.  [name] triamcinolone ointment (KENALOG) 0.1 % Apply 1 application topically daily as needed.  . warfarin (COUMADIN) 5 MG tablet USE AS DIRECTED BY COUMADIN CLINIC (Patient taking differently: Take 2.5-5 mg by mouth See admin instructions. Take 2.5 mg on Tues  and Sat. Take 5 mg all other days.)  . [DISCONTINUED] atorvastatin (LIPITOR) 40 MG tablet Take 1 tablet (40 mg total) by mouth daily. Please make overdue appt with Dr. Irish Lack before anymore refills. 2nd attempt  . [DISCONTINUED] clopidogrel (PLAVIX) 75 MG tablet Take 1 tablet by mouth once daily  . [DISCONTINUED] lisinopril (ZESTRIL) 10 MG tablet Take 1 tablet (10 mg total) by mouth daily. Please make overdue appt with Dr. Irish Lack before anymore refills. 2nd attempt  . [DISCONTINUED] metoprolol tartrate (LOPRESSOR) 25 MG tablet Take 1 tablet (25 mg total) by mouth 2  (two) times daily. Please make overdue appt with Dr. Irish Lack before anymore refills. 2nd attempt  . [DISCONTINUED] nitroGLYCERIN (NITROSTAT) 0.4 MG SL tablet Place 1 tablet (0.4 mg total) under the tongue every 5 (five) minutes as needed for chest pain.     Allergies:   Tetanus toxoid   Social History   Socioeconomic History  . Marital status: Divorced    Spouse name: Not on file  . Number of children: Not on file  . Years of education: Not on file  . Highest education level: Not on file  Occupational History  . 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    Comment: [employer]  Tobacco Use  . Smoking status: Current Some Day Smoker    Packs/day: 0.25    Years: 27.00    Pack years: 6.75    Types: Cigarettes  . Smokeless tobacco: Never Used  Vaping Use  . Vaping Use: Never used  Substance and Sexual Activity  . Alcohol use: Yes    Comment: 12/10/2016 "if I have a beer once/month that's alot"  . Drug use: No  . Sexual activity: Not Currently  Other Topics Concern  . Not on file  Social History Narrative  . Not on file   Social Determinants of Health   Financial Resource Strain:   . Difficulty of Paying Living Expenses:   Food Insecurity:   . Worried About Charity fundraiser in the Last Year:   . Arboriculturist in the Last Year:   Transportation Needs:   . Film/video editor (Medical):   [name] Lack of Transportation (Non-Medical):   Physical Activity:   . Days of Exercise per Week:   . Minutes of Exercise per Session:   Stress:   . Feeling of Stress :   Social Connections:   . Frequency of Communication with Friends and Family:   . Frequency of Social Gatherings with Friends and Family:   . Attends Religious Services:   . Active Member of Clubs or Organizations:   . Attends Archivist Meetings:   [name] Marital Status:      Family History:  The patient's   family history includes Cancer in his brother; Heart attack in his father; Heart disease in  his father; Stroke in his brother.   ROS:   Please see the history of present illness.    ROS All other systems reviewed and are negative.   PHYSICAL EXAM:   VS:  BP 108/70   Pulse 73   Ht 5\' 8"  (1.727 m)   Wt 178 lb 6.4 oz (80.9 kg)   SpO2 96%   BMI 27.13 kg/m   Physical Exam  GEN: Well nourished, well developed, in no acute distress  Neck: no JVD, bilateral carotid bruits right greater than left  Cardiac:RRR; no murmurs, rubs, or gallops  Respiratory:  clear to auscultation bilaterally, normal work  of breathing GI: soft, nontender, nondistended, + BS Ext: without cyanosis, clubbing, or edema, Good distal pulses bilaterally Neuro:  Alert and Oriented x 3 Psych: euthymic mood, full affect  Wt Readings from Last 3 Encounters:  05/04/20 178 lb 6.4 oz (80.9 kg)  04/04/20 173 lb (78.5 kg)  03/22/20 165 lb (74.8 kg)      Studies/Labs Reviewed:   EKG:  EKG is not ordered today.  The ekg reviewed from 03/2020 stable Recent Labs: 03/22/2020: ALT 30; BUN 10; Creatinine, Ser 0.74; Hemoglobin 14.4; Platelets 238; Potassium 5.0; Sodium 141   Lipid Panel    Component Value Date/Time   CHOL 110 10/20/2019 0909   TRIG 164 (H) 10/20/2019 0909   HDL 44 10/20/2019 0909   CHOLHDL 2.5 10/20/2019 0909   CHOLHDL 5.1 CALC 10/04/2008 0847   VLDL 28 10/04/2008 0847   LDLCALC 39 10/20/2019 0909   LDLDIRECT 120.9 10/04/2008 0847    Additional studies/ records that were reviewed today include:  Carotid Dopplers 04/04/2020 Summary:  Right Carotid: Velocities in the right ICA are consistent with a 1-39%  stenosis.                Non-hemodynamically significant plaque <50% noted in the  CCA. The                 ECA appears <50% stenosed. Innominate artery: 150 cm/s.   Left Carotid: Velocities in the left ICA are consistent with a 1-39%  stenosis.               Non-hemodynamically significant plaque <50% noted in the  CCA. The                ECA appears <50% stenosed. Elevated velocities  in the left  ECA may                be obscured by shadowing of adjacent structures.   Vertebrals:  Left vertebral artery demonstrates antegrade flow. Right  vertebral              artery demontrates atypical flow patterns.  Subclavians: Right subclavian artery was stenotic. Normal flow  hemodynamics were               seen in the left subclavian artery.   *See table(s) above for measurements and observations.      Upper extremity arterial Doppler 04/04/2020  Summary:    Right: >50% stenosis visualized in the right upper extremity.         Obstruction noted in the subclavian artery.  *See table(s) above for measurements and observations.     Cath in 2018 showed: 1. Significant 2 vessel coronary artery disease, including 95% midvessel stenosis of very large RCA, as well as chronic total occlusion of small apical LAD. 2. Mild diffuse disease involving LAD and LCx. 3. Normal left ventricular filling pressure. 4. Normal left ventricular contraction. 5. Successful IVUS-guided PCI of mid RCA with placement of Xience Alpine 3.5 x 28 mm drug-eluting stent (post-dilated with 4.5 mm non-compliant balloon) with 0% residual stenosis and TIMI-3 flow.      ASSESSMENT:    1. Coronary artery disease involving native coronary artery of native heart without angina pectoris   2. Paroxysmal atrial fibrillation (HCC)   3. Essential hypertension   4. Hyperlipidemia, unspecified hyperlipidemia type   5. Subclavian steal syndrome      PLAN:  In order of problems listed above:  CAD PCI/DES mRCA 2018 on Plavix, Lipitor metoprolol and  lisinopril.  No angina.  Recommend 150 minutes of exercise weekly and smoking cessation  PAF on Coumadin no bleeding or palpitations.  Recent trauma to his right arm with a hematoma and torn muscle but that has healed  HTN blood pressure controlled  HLD on Lipitor check fasting lipid panel today  Subclavian steal syndrome followed by Dr. Arrie Aran  dopplers reviewed above    Medication Adjustments/Labs and Tests Ordered: Current medicines are reviewed at length with the patient today.  Concerns regarding medicines are outlined above.  Medication changes, Labs and Tests ordered today are listed in the Patient Instructions below. Patient Instructions  Medication Instructions:  Your physician recommends that you continue on your current medications as directed. Please refer to the Current Medication list given to you today.  *If you need a refill on your cardiac medications before your next appointment, please call your pharmacy*   Lab Work: TODAY: LIPIDS  If you have labs (blood work) drawn today and your tests are completely normal, you will receive your results only by: [name] MyChart Message (if you have MyChart) OR . A paper copy in the mail If you have any lab test that is abnormal or we need to change your treatment, we will call you to review the results.   Testing/Procedures: None   Follow-Up: At [facility], you and your health needs are our priority.  As part of our continuing mission to provide you with exceptional heart care, we have created designated Provider Care Teams.  These Care Teams include your primary Cardiologist (physician) and Advanced Practice Providers (APPs -  Physician Assistants and Nurse Practitioners) who all work together to provide you with the care you need, when you need it.  We recommend signing up for the patient portal called "MyChart".  Sign up information is provided on this After Visit Summary.  MyChart is used to connect with patients for Virtual Visits (Telemedicine).  Patients are able to view lab/test results, encounter notes, upcoming appointments, etc.  Non-urgent messages can be sent to your provider as well.   To learn more about what you can do with MyChart, go to [email or web address].    Your next appointment:   12 month(s)  The format for your next appointment:   In  Person  Provider:   You may see Lance Muss, MD or one of the following Advanced Practice Providers on your designated Care Team:    Ronie Spies, PA-C  Jacolyn Reedy, PA-C    Other Instructions Your provider recommends that you maintain 150 minutes per week of moderate aerobic activity.   Steps to Quit Smoking Smoking tobacco is the leading cause of preventable death. It can affect almost every organ in the body. Smoking puts you and people around you at risk for many serious, long-lasting (chronic) diseases. Quitting smoking can be hard, but it is one of the best things that you can do for your health. It is never too late to quit. How do I get ready to quit? When you decide to quit smoking, make a plan to help you succeed. Before you quit:  Pick a date to quit. Set a date within the next 2 weeks to give you time to prepare.  Write down the reasons why you are quitting. Keep this list in places where you will see it often.  Tell your family, friends, and co-workers that you are quitting. Their support is important.  Talk with your doctor about the choices that may help you quit.  Find out if your health insurance will pay for these treatments.  Know the people, places, things, and activities that make you want to smoke (triggers). Avoid them. What first steps can I take to quit smoking?  Throw away all cigarettes at home, at work, and in your car.  Throw away the things that you use when you smoke, such as ashtrays and lighters.  Clean your car. Make sure to empty the ashtray.  Clean your home, including curtains and carpets. What can I do to help me quit smoking? Talk with your doctor about taking medicines and seeing a counselor at the same time. You are more likely to succeed when you do both.  If you are pregnant or breastfeeding, talk with your doctor about counseling or other ways to quit smoking. Do not take medicine to help you quit smoking unless your doctor  tells you to do so. To quit smoking: Quit right away  Quit smoking totally, instead of slowly cutting back on how much you smoke over a period of time.  Go to counseling. You are more likely to quit if you go to counseling sessions regularly. Take medicine You may take medicines to help you quit. Some medicines need a prescription, and some you can buy over-the-counter. Some medicines may contain a drug called nicotine to replace the nicotine in cigarettes. Medicines may:  Help you to stop having the desire to smoke (cravings).  Help to stop the problems that come when you stop smoking (withdrawal symptoms). Your doctor may ask you to use:  Nicotine patches, gum, or lozenges.  Nicotine inhalers or sprays.  Non-nicotine medicine that is taken by mouth. Find resources Find resources and other ways to help you quit smoking and remain smoke-free after you quit. These resources are most helpful when you use them often. They include:  Online chats with a Veterinary surgeon.  Phone quitlines.  Printed Materials engineer.  Support groups or group counseling.  Text messaging programs.  Mobile phone apps. Use apps on your mobile phone or tablet that can help you stick to your quit plan. There are many free apps for mobile phones and tablets as well as websites. Examples include Quit Guide from the [employer] and smokefree.gov  What things can I do to make it easier to quit?   Talk to your family and friends. Ask them to support and encourage you.  Call a phone quitline (1-800-QUIT-NOW), reach out to support groups, or work with a Veterinary surgeon.  Ask people who smoke to not smoke around you.  Avoid places that make you want to smoke, such as: ? Bars. ? Parties. ? Smoke-break areas at work.  Spend time with people who do not smoke.  Lower the stress in your life. Stress can make you want to smoke. Try these things to help your stress: ? Getting regular exercise. ? Doing deep-breathing  exercises. ? Doing yoga. ? Meditating. ? Doing a body scan. To do this, close your eyes, focus on one area of your body at a time from head to toe. Notice which parts of your body are tense. Try to relax the muscles in those areas. How will I feel when I quit smoking? Day 1 to 3 weeks Within the first 24 hours, you may start to have some problems that come from quitting tobacco. These problems are very bad 2-3 days after you quit, but they do not often last for more than 2-3 weeks. You may get these symptoms:  Mood swings.  Feeling restless, nervous, angry, or annoyed.  Trouble concentrating.  Dizziness.  Strong desire for high-sugar foods and nicotine.  Weight gain.  Trouble pooping (constipation).  Feeling like you may vomit (nausea).  Coughing or a sore throat.  Changes in how the medicines that you take for other issues work in your body.  Depression.  Trouble sleeping (insomnia). Week 3 and afterward After the first 2-3 weeks of quitting, you may start to notice more positive results, such as:  Better sense of smell and taste.  Less coughing and sore throat.  Slower heart rate.  Lower blood pressure.  Clearer skin.  Better breathing.  Fewer sick days. Quitting smoking can be hard. Do not give up if you fail the first time. Some people need to try a few times before they succeed. Do your best to stick to your quit plan, and talk with your doctor if you have any questions or concerns. Summary  Smoking tobacco is the leading cause of preventable death. Quitting smoking can be hard, but it is one of the best things that you can do for your health.  When you decide to quit smoking, make a plan to help you succeed.  Quit smoking right away, not slowly over a period of time.  When you start quitting, seek help from your doctor, family, or friends. This information is not intended to replace advice given to you by your health care provider. Make sure you discuss  any questions you have with your health care provider. Document Revised: 07/24/2019 Document Reviewed: 01/17/2019 Elsevier Patient Education  [address]      Signed, Jacolyn Reedy, [place]  05/04/2020 9:50 AM    [facility] [address] [place], [place], [place]  [address] Phone: [phone]; Fax: [phone]

## 2020-05-06 ENCOUNTER — Telehealth: Payer: Self-pay | Admitting: Physician Assistant

## 2020-05-06 ENCOUNTER — Telehealth: Payer: Self-pay

## 2020-05-06 DIAGNOSIS — E785 Hyperlipidemia, unspecified: Secondary | ICD-10-CM

## 2020-05-06 DIAGNOSIS — I10 Essential (primary) hypertension: Secondary | ICD-10-CM

## 2020-05-06 MED ORDER — OMEGA-3 FISH OIL 1000 MG PO CAPS: 1.0000 | ORAL_CAPSULE | Freq: Two times a day (BID) | ORAL | 3 refills | Status: DC

## 2020-05-06 MED ORDER — WARFARIN SODIUM 5 MG PO TABS: 2.5000 mg | ORAL_TABLET | ORAL | 3 refills | Status: DC

## 2020-05-06 MED ORDER — ATORVASTATIN CALCIUM 40 MG PO TABS: 40.0000 mg | ORAL_TABLET | Freq: Every day | ORAL | 3 refills | Status: DC

## 2020-05-06 MED ORDER — METOPROLOL TARTRATE 25 MG PO TABS: 25.0000 mg | ORAL_TABLET | Freq: Two times a day (BID) | ORAL | 3 refills | Status: DC

## 2020-05-06 MED ORDER — CLOPIDOGREL BISULFATE 75 MG PO TABS: 75.0000 mg | ORAL_TABLET | Freq: Every day | ORAL | 3 refills | Status: DC

## 2020-05-06 MED ORDER — NITROGLYCERIN 0.4 MG SL SUBL: 0.4000 mg | SUBLINGUAL_TABLET | SUBLINGUAL | 3 refills | Status: DC | PRN

## 2020-05-06 MED ORDER — LISINOPRIL 10 MG PO TABS: 10.0000 mg | ORAL_TABLET | Freq: Every day | ORAL | 3 refills | Status: DC

## 2020-05-06 NOTE — Telephone Encounter (Signed)
Patient returning call for lab results. 

## 2020-05-06 NOTE — Addendum Note (Signed)
Addended by: Roney Mans A on: 05/06/2020 01:54 PM   Modules accepted: Orders

## 2020-05-06 NOTE — Telephone Encounter (Signed)
Triglycerides high. Avoid processed sugars in diet. Begin omega 3 fish oil 1000 mg twice daily. Thanks   Pt advised of lab results and recommendations.    Pt in agreement with starting fish oil.  Also requesting all medications be refilled.  Medications prescribed as requested.

## 2020-05-06 NOTE — Telephone Encounter (Signed)
error 

## 2020-06-14 ENCOUNTER — Other Ambulatory Visit: Payer: Self-pay

## 2020-06-14 ENCOUNTER — Ambulatory Visit (INDEPENDENT_AMBULATORY_CARE_PROVIDER_SITE_OTHER): Payer: Self-pay | Admitting: *Deleted

## 2020-06-14 DIAGNOSIS — Z5181 Encounter for therapeutic drug level monitoring: Secondary | ICD-10-CM

## 2020-06-14 DIAGNOSIS — I480 Paroxysmal atrial fibrillation: Secondary | ICD-10-CM

## 2020-06-14 LAB — POCT INR: INR: 3 (ref 2.0–3.0)

## 2020-06-14 NOTE — Patient Instructions (Signed)
Description   Continue taking 1 tablet daily except for 1/2 tablet on Tuesdays and Saturdays. Recheck in 8 weeks. Main # [phone] [facility]in [facility]# [phone]    

## 2020-08-09 ENCOUNTER — Other Ambulatory Visit: Payer: Self-pay

## 2020-08-09 ENCOUNTER — Ambulatory Visit (INDEPENDENT_AMBULATORY_CARE_PROVIDER_SITE_OTHER): Payer: Self-pay | Admitting: Pharmacist

## 2020-08-09 DIAGNOSIS — Z5181 Encounter for therapeutic drug level monitoring: Secondary | ICD-10-CM

## 2020-08-09 DIAGNOSIS — I480 Paroxysmal atrial fibrillation: Secondary | ICD-10-CM

## 2020-08-09 LAB — POCT INR: INR: 2.4 (ref 2.0–3.0)

## 2020-08-09 NOTE — Patient Instructions (Signed)
Description   Continue taking 1 tablet daily except for 1/2 tablet on Tuesdays and Saturdays. Recheck in 8 weeks. Main # [phone] [facility]in [facility]# [phone]    

## 2020-10-04 ENCOUNTER — Other Ambulatory Visit: Payer: Self-pay

## 2020-10-04 ENCOUNTER — Ambulatory Visit (INDEPENDENT_AMBULATORY_CARE_PROVIDER_SITE_OTHER): Payer: Self-pay | Admitting: *Deleted

## 2020-10-04 DIAGNOSIS — Z5181 Encounter for therapeutic drug level monitoring: Secondary | ICD-10-CM

## 2020-10-04 DIAGNOSIS — I480 Paroxysmal atrial fibrillation: Secondary | ICD-10-CM

## 2020-10-04 LAB — POCT INR: INR: 2.1 (ref 2.0–3.0)

## 2020-10-04 NOTE — Patient Instructions (Signed)
Description   Continue taking 1 tablet daily except for 1/2 tablet on Tuesdays and Saturdays. Recheck in 8 weeks. Main # [phone] [facility]in [facility]# [phone]    

## 2020-12-12 ENCOUNTER — Ambulatory Visit (INDEPENDENT_AMBULATORY_CARE_PROVIDER_SITE_OTHER): Payer: Self-pay | Admitting: *Deleted

## 2020-12-12 ENCOUNTER — Other Ambulatory Visit: Payer: Self-pay

## 2020-12-12 DIAGNOSIS — I480 Paroxysmal atrial fibrillation: Secondary | ICD-10-CM

## 2020-12-12 DIAGNOSIS — Z5181 Encounter for therapeutic drug level monitoring: Secondary | ICD-10-CM

## 2020-12-12 LAB — POCT INR: INR: 2.1 (ref 2.0–3.0)

## 2020-12-12 NOTE — Patient Instructions (Signed)
Description   Continue taking 1 tablet daily except for 1/2 tablet on Tuesdays and Saturdays. Recheck in 8 weeks. Main # [phone] Cou[facility]in Clinic# [phone]

## 2021-02-06 ENCOUNTER — Ambulatory Visit (INDEPENDENT_AMBULATORY_CARE_PROVIDER_SITE_OTHER): Payer: Self-pay

## 2021-02-06 ENCOUNTER — Other Ambulatory Visit: Payer: Self-pay

## 2021-02-06 DIAGNOSIS — Z5181 Encounter for therapeutic drug level monitoring: Secondary | ICD-10-CM

## 2021-02-06 DIAGNOSIS — I480 Paroxysmal atrial fibrillation: Secondary | ICD-10-CM

## 2021-02-06 LAB — POCT INR: INR: 2.3 (ref 2.0–3.0)

## 2021-02-06 NOTE — Patient Instructions (Signed)
Description   Continue taking 1 tablet daily except for 1/2 tablet on Tuesdays and Saturdays. Recheck in 8 weeks. Main # [phone] [facility]in [facility]# [phone]

## 2021-04-05 ENCOUNTER — Ambulatory Visit (INDEPENDENT_AMBULATORY_CARE_PROVIDER_SITE_OTHER): Payer: Self-pay | Admitting: *Deleted

## 2021-04-05 ENCOUNTER — Other Ambulatory Visit: Payer: Self-pay

## 2021-04-05 DIAGNOSIS — Z5181 Encounter for therapeutic drug level monitoring: Secondary | ICD-10-CM

## 2021-04-05 DIAGNOSIS — I480 Paroxysmal atrial fibrillation: Secondary | ICD-10-CM

## 2021-04-05 LAB — POCT INR: INR: 4.1 — AB (ref 2.0–3.0)

## 2021-04-05 NOTE — Patient Instructions (Signed)
Description   Hold warfarin today and eat an extra servings of greens. Then continue to take warfarin 1 tablet daily except for 1/2 a tablet on Tuesday and Saturdays. Recheck INR in 2 weeks.  Main # [phone] Coumadin Clinic# [phone]

## 2021-04-21 ENCOUNTER — Ambulatory Visit (INDEPENDENT_AMBULATORY_CARE_PROVIDER_SITE_OTHER): Payer: Self-pay | Admitting: *Deleted

## 2021-04-21 ENCOUNTER — Other Ambulatory Visit: Payer: Self-pay

## 2021-04-21 DIAGNOSIS — I480 Paroxysmal atrial fibrillation: Secondary | ICD-10-CM

## 2021-04-21 DIAGNOSIS — Z5181 Encounter for therapeutic drug level monitoring: Secondary | ICD-10-CM

## 2021-04-21 LAB — POCT INR: INR: 4.1 — AB (ref 2.0–3.0)

## 2021-04-21 NOTE — Patient Instructions (Signed)
Description   Hold warfarin today and eat an extra servings of greens and start taking warfarin 1 tablet daily except for 1/2 tablet on Tuesdays, Thursdays, and Saturdays. Recheck INR in 10 days.  Main # [phone] Cou[facility]in [facility]# [phone]

## 2021-04-24 ENCOUNTER — Other Ambulatory Visit: Payer: Self-pay | Admitting: Pharmacist

## 2021-04-24 MED ORDER — WARFARIN SODIUM 5 MG PO TABS: ORAL_TABLET | ORAL | 0 refills | Status: DC

## 2021-04-28 ENCOUNTER — Other Ambulatory Visit: Payer: Self-pay | Admitting: Physician Assistant

## 2021-05-01 ENCOUNTER — Other Ambulatory Visit: Payer: Self-pay

## 2021-05-01 ENCOUNTER — Ambulatory Visit (INDEPENDENT_AMBULATORY_CARE_PROVIDER_SITE_OTHER): Payer: Self-pay

## 2021-05-01 DIAGNOSIS — Z5181 Encounter for therapeutic drug level monitoring: Secondary | ICD-10-CM

## 2021-05-01 DIAGNOSIS — I480 Paroxysmal atrial fibrillation: Secondary | ICD-10-CM

## 2021-05-01 LAB — POCT INR: INR: 2.6 (ref 2.0–3.0)

## 2021-05-01 MED ORDER — WARFARIN SODIUM 5 MG PO TABS: ORAL_TABLET | ORAL | 1 refills | Status: DC

## 2021-05-01 NOTE — Patient Instructions (Signed)
Description   Continue on same dosage of warfarin 1 tablet daily except for 1/2 tablet on Tuesdays, Thursdays, and Saturdays. Recheck INR in 3 weeks, pt requests 4 weeks.  Main # [phone] Cou[facility]in Clinic# [phone]

## 2021-05-29 ENCOUNTER — Other Ambulatory Visit: Payer: Self-pay

## 2021-05-29 ENCOUNTER — Ambulatory Visit (INDEPENDENT_AMBULATORY_CARE_PROVIDER_SITE_OTHER): Payer: Self-pay | Admitting: *Deleted

## 2021-05-29 DIAGNOSIS — I480 Paroxysmal atrial fibrillation: Secondary | ICD-10-CM

## 2021-05-29 DIAGNOSIS — Z5181 Encounter for therapeutic drug level monitoring: Secondary | ICD-10-CM

## 2021-05-29 LAB — POCT INR: INR: 3.5 — AB (ref 2.0–3.0)

## 2021-05-29 NOTE — Patient Instructions (Signed)
Description   Hold warfarin today, then Continue on same dosage of warfarin 1 tablet daily except for 1/2 tablet on Tuesdays, Thursdays, and Saturdays. Recheck INR in 3 weeks.  Main # [phone] Cou[facility]in [facility]# [phone]

## 2021-06-19 ENCOUNTER — Ambulatory Visit (INDEPENDENT_AMBULATORY_CARE_PROVIDER_SITE_OTHER): Payer: Self-pay

## 2021-06-19 ENCOUNTER — Other Ambulatory Visit: Payer: Self-pay

## 2021-06-19 DIAGNOSIS — Z5181 Encounter for therapeutic drug level monitoring: Secondary | ICD-10-CM

## 2021-06-19 DIAGNOSIS — I480 Paroxysmal atrial fibrillation: Secondary | ICD-10-CM

## 2021-06-19 LAB — POCT INR: INR: 4.3 — AB (ref 2.0–3.0)

## 2021-06-19 NOTE — Patient Instructions (Signed)
-   Hold warfarin today, & tomorrow, then  - resume same dosage of warfarin 1 tablet daily except for 1/2 tablet on Tuesdays, Thursdays, and Saturdays.  - Recheck INR in 3 weeks.   Main # [phone] Coumadin Clinic# [phone] * ONLY TAKE TYLENOL AND/OR IBUPROFEN AS NEEDED - TRY NOT TO TAKE EVERY DAY

## 2021-06-20 NOTE — Progress Notes (Signed)
Cardiology Office Note   Date:  06/22/2021   ID:  [name], DOB 1956/06/02, MRN [id number]  PCP:  Lorenda Ishihara, MD    No chief complaint on file.  PAF, CAD  Wt Readings from Last 3 Encounters:  06/22/21 176 lb 3.2 oz (79.9 kg)  05/04/20 178 lb 6.4 oz (80.9 kg)  04/04/20 173 lb (78.5 kg)       History of Present Illness: [name] is a 65 y.o. male   with  CAD, PAF, Carotid artery disease, HTN, HLD and subclavian steal syndrome presents for follow up.   Hx of Coronary artery disease status post PCI to the mid RCA in 11/2016 by Dr. Okey Dupre, complicated by right groin pseudoaneurysm.     Cath in 2018 showed: Significant 2 vessel coronary artery disease, including 95% midvessel stenosis of very large RCA, as well as chronic total occlusion of small apical LAD. Mild diffuse disease involving LAD and LCx. Normal left ventricular filling pressure. Normal left ventricular contraction. Successful IVUS-guided PCI of mid RCA with placement of Xience Alpine 3.5 x 28 mm drug-eluting stent (post-dilated with 4.5 mm non-compliant balloon) with 0% residual stenosis and TIMI-3 flow.   Subclavian steal syndrome followed by Dr. Myra Gianotti.  Had torn muscle in right arm in 2021 with hematoma.  Confirmed with MRI.   Denies : Chest pain. Dizziness. Leg edema. Nitroglycerin use. Orthopnea. Palpitations. Paroxysmal nocturnal dyspnea. Shortness of breath. Syncope.    Not walking much; mostly at work.      Past Medical History:  Diagnosis Date   Carotid artery occlusion    Right Carotid Bruit   Chest pain 2003   neg evaluation   Coronary artery disease    a. 12/10/16 PCI with DES--> RCA, normal EF   History of kidney stones    HTN (hypertension)    Hyperlipemia    Leg pain    a. ABIs (12/10):  Normal   Paroxysmal atrial fibrillation (HCC)    Subclavian steal syndrome Aug. 2015   Occult Right    Past Surgical History:  Procedure Laterality Date   CARDIAC  CATHETERIZATION N/A 12/10/2016   Procedure: Left Heart Cath and Coronary Angiography;  Surgeon: Yvonne Kendall, MD;  Location: [facility] INVASIVE CV LAB;  Service: Cardiovascular;  Laterality: N/A;   CARDIAC CATHETERIZATION N/A 12/10/2016   Procedure: Coronary Stent Intervention;  Surgeon: Yvonne Kendall, MD;  Location: [facility] INVASIVE CV LAB;  Service: Cardiovascular;  Laterality: N/A;   CARDIAC CATHETERIZATION N/A 12/10/2016   Procedure: Intravascular Ultrasound/IVUS;  Surgeon: Yvonne Kendall, MD;  Location: [facility] INVASIVE CV LAB;  Service: Cardiovascular;  Laterality: N/A;   COLONOSCOPY  10/2007   negative results   COLONOSCOPY W/ POLYPECTOMY  2003   CORONARY ANGIOPLASTY WITH STENT PLACEMENT  12/10/2016   CYSTECTOMY     removed from back   epidural steroids     in the back x3   JOINT REPLACEMENT Right February 29, 1976   KNEE   REPAIR PATELLAR TENDON Right    SHOULDER ARTHROSCOPY WITH OPEN ROTATOR CUFF REPAIR Right      Current Outpatient Medications  Medication Sig Dispense Refill   clopidogrel (PLAVIX) 75 MG tablet Take 1 tablet by mouth once daily 90 tablet 3   lisinopril (ZESTRIL) 10 MG tablet Take 1 tablet (10 mg total) by mouth daily. 90 tablet 3   metoprolol tartrate (LOPRESSOR) 25 MG tablet Take 1 tablet by mouth twice daily 180 tablet 3   nitroGLYCERIN (NITROSTAT) 0.4 MG  SL tablet Place 1 tablet (0.4 mg total) under the tongue every 5 (five) minutes as needed for chest pain. 25 tablet 3   omega-3 fish oil (MAXEPA) 1000 MG CAPS capsule Take 1 capsule (1,000 mg total) by mouth 2 (two) times daily. 180 capsule 3   warfarin (COUMADIN) 5 MG tablet Take 1/2 to 1 tablet by mouth once daily as directed by coumadin clinic 90 tablet 1   No current facility-administered medications for this visit.    Allergies:   Tetanus toxoid    Social History:  The patient  reports that he has been smoking cigarettes. He has a 6.75 pack-year smoking history. He has never used smokeless tobacco. He reports  current alcohol use. He reports that he does not use drugs.   Family History:  The patient's family history includes Cancer in his brother; Heart attack in his father; Heart disease in his father; Stroke in his brother.    ROS:  Please see the history of present illness.   Otherwise, review of systems are positive for .   All other systems are reviewed and negative.    PHYSICAL EXAM: VS:  BP 128/60   Pulse 69   Ht 5\' 8"  (1.727 m)   Wt 176 lb 3.2 oz (79.9 kg)   SpO2 96%   BMI 26.79 kg/m  , BMI Body mass index is 26.79 kg/m. GEN: Well nourished, well developed, in no acute distress HEENT: normal Neck: no JVD, 1/6 left carotid bruit- I suspect this is related to the subclavian disease, no masses Cardiac: RRR; no murmurs, rubs, or gallops,no edema  Respiratory:  clear to auscultation bilaterally, normal work of breathing GI: soft, nontender, nondistended, + BS MS: no deformity or atrophy; 2+ left radial pulse; 3 + right radial pulse Skin: warm and dry, no rash Neuro:  Strength and sensation are intact Psych: euthymic mood, full affect   EKG:   The ekg ordered today demonstrates NSR, nonspecific ST changes   Recent Labs: No results found for requested labs within last 8760 hours.   Lipid Panel    Component Value Date/Time   CHOL 126 05/04/2020 0940   TRIG 182 (H) 05/04/2020 0940   HDL 43 05/04/2020 0940   CHOLHDL 2.9 05/04/2020 0940   CHOLHDL 5.1 CALC 10/04/2008 0847   VLDL 28 10/04/2008 0847   LDLCALC 53 05/04/2020 0940   LDLDIRECT 120.9 10/04/2008 0847     Other studies Reviewed: Additional studies/ records that were reviewed today with results demonstrating: 2021 carotid Duplex reviewed; 2021 labs reviewed.   ASSESSMENT AND PLAN:  CAD: No angina on medical therapy.  Continue aggressive secondary prevention.  Prior RCA stent.  Increase steady exercise to get his heart rate elevated.  The walking he gets at work is more start/stop. PAD: stable- mild fatigue of left  arm.  No syncope.  Needs to follow with [facility].  He will call them.  He did have routine ultrasounds with them. HTN: The current medical regimen is effective;  continue present plan and medications. Hyperlipidemia: Avoid processed foods.  Whole food, plant-based diet.  Recheck lipids today while fasting. Tobacco abuse: cutting back.  Worse with stress.  PAF: Coumadin for stroke prevention.  Check Hbg given anticoagulation.    Current medicines are reviewed at length with the patient today.  The patient concerns regarding his medicines were addressed.  The following changes have been made:  No change  Labs/ tests ordered today include:  No orders of the defined types were  placed in this encounter.   Recommend 150 minutes/week of aerobic exercise Low fat, low carb, high fiber diet recommended  Disposition:   FU in 1 year   Signed, Lance Muss, MD  06/22/2021 9:39 AM    [facility] [address] [place], [place], [place]  [address] Phone: [phone]; Fax: [phone]

## 2021-06-22 ENCOUNTER — Ambulatory Visit (INDEPENDENT_AMBULATORY_CARE_PROVIDER_SITE_OTHER): Payer: Self-pay | Admitting: Interventional Cardiology

## 2021-06-22 ENCOUNTER — Encounter: Payer: Self-pay | Admitting: Interventional Cardiology

## 2021-06-22 ENCOUNTER — Other Ambulatory Visit: Payer: Self-pay

## 2021-06-22 VITALS — BP 128/60 | HR 69 | Ht 68.0 in | Wt 176.2 lb

## 2021-06-22 DIAGNOSIS — I2510 Atherosclerotic heart disease of native coronary artery without angina pectoris: Secondary | ICD-10-CM

## 2021-06-22 DIAGNOSIS — I480 Paroxysmal atrial fibrillation: Secondary | ICD-10-CM

## 2021-06-22 DIAGNOSIS — I10 Essential (primary) hypertension: Secondary | ICD-10-CM

## 2021-06-22 LAB — CBC
Hematocrit: 38.9 % (ref 37.5–51.0)
Hemoglobin: 13.4 g/dL (ref 13.0–17.7)
MCH: 30 pg (ref 26.6–33.0)
MCHC: 34.4 g/dL (ref 31.5–35.7)
MCV: 87 fL (ref 79–97)
Platelets: 259 10*3/uL (ref 150–450)
RBC: 4.47 x10E6/uL (ref 4.14–5.80)
RDW: 12.7 % (ref 11.6–15.4)
WBC: 14.1 10*3/uL — ABNORMAL HIGH (ref 3.4–10.8)

## 2021-06-22 LAB — LIPID PANEL
Chol/HDL Ratio: 2.9 ratio (ref 0.0–5.0)
Cholesterol, Total: 111 mg/dL (ref 100–199)
HDL: 38 mg/dL — ABNORMAL LOW
LDL Chol Calc (NIH): 48 mg/dL (ref 0–99)
Triglycerides: 143 mg/dL (ref 0–149)
VLDL Cholesterol Cal: 25 mg/dL (ref 5–40)

## 2021-06-22 LAB — COMPREHENSIVE METABOLIC PANEL
ALT: 14 IU/L (ref 0–44)
AST: 20 IU/L (ref 0–40)
Albumin/Globulin Ratio: 2.5 — ABNORMAL HIGH (ref 1.2–2.2)
Albumin: 4.5 g/dL (ref 3.8–4.8)
Alkaline Phosphatase: 85 IU/L (ref 44–121)
BUN/Creatinine Ratio: 16 (ref 10–24)
BUN: 12 mg/dL (ref 8–27)
Bilirubin Total: 0.3 mg/dL (ref 0.0–1.2)
CO2: 19 mmol/L — ABNORMAL LOW (ref 20–29)
Calcium: 9.1 mg/dL (ref 8.6–10.2)
Chloride: 108 mmol/L — ABNORMAL HIGH (ref 96–106)
Creatinine, Ser: 0.74 mg/dL — ABNORMAL LOW (ref 0.76–1.27)
Globulin, Total: 1.8 g/dL (ref 1.5–4.5)
Glucose: 106 mg/dL — ABNORMAL HIGH (ref 65–99)
Potassium: 4.1 mmol/L (ref 3.5–5.2)
Sodium: 141 mmol/L (ref 134–144)
Total Protein: 6.3 g/dL (ref 6.0–8.5)
eGFR: 102 mL/min/{1.73_m2} (ref >59)

## 2021-06-22 MED ORDER — NITROGLYCERIN 0.4 MG SL SUBL: 0.4000 mg | SUBLINGUAL_TABLET | SUBLINGUAL | 3 refills | Status: DC | PRN

## 2021-06-22 NOTE — Patient Instructions (Signed)
Medication Instructions:  Your physician recommends that you continue on your current medications as directed. Please refer to the Current Medication list given to you today.  *If you need a refill on your cardiac medications before your next appointment, please call your pharmacy*   Lab Work: Lab work to be done today--CBC, CMET and Lipid If you have labs (blood work) drawn today and your tests are completely normal, you will receive your results only by: MyChart Message (if you have MyChart) OR A paper copy in the mail If you have any lab test that is abnormal or we need to change your treatment, we will call you to review the results.   Testing/Procedures: none   Follow-Up: At [facility], you and your health needs are our priority.  As part of our continuing mission to provide you with exceptional heart care, we have created designated Provider Care Teams.  These Care Teams include your primary Cardiologist (physician) and Advanced Practice Providers (APPs -  Physician Assistants and Nurse Practitioners) who all work together to provide you with the care you need, when you need it.  We recommend signing up for the patient portal called "MyChart".  Sign up information is provided on this After Visit Summary.  MyChart is used to connect with patients for Virtual Visits (Telemedicine).  Patients are able to view lab/test results, encounter notes, upcoming appointments, etc.  Non-urgent messages can be sent to your provider as well.   To learn more about what you can do with MyChart, go to [email or web address].    Your next appointment:   12 month(s)  The format for your next appointment:   In Person  Provider:   You may see Lance Muss, MD or one of the following Advanced Practice Providers on your designated Care Team:   Ronie Spies, PA-C Jacolyn Reedy, PA-C   Other Instructions

## 2021-07-10 ENCOUNTER — Ambulatory Visit (INDEPENDENT_AMBULATORY_CARE_PROVIDER_SITE_OTHER): Payer: Self-pay

## 2021-07-10 ENCOUNTER — Other Ambulatory Visit: Payer: Self-pay

## 2021-07-10 DIAGNOSIS — I480 Paroxysmal atrial fibrillation: Secondary | ICD-10-CM

## 2021-07-10 DIAGNOSIS — Z5181 Encounter for therapeutic drug level monitoring: Secondary | ICD-10-CM

## 2021-07-10 LAB — POCT INR: INR: 2.3 (ref 2.0–3.0)

## 2021-07-10 NOTE — Patient Instructions (Signed)
-   continue same dosage of warfarin 1 tablet daily except for 1/2 tablet on Tuesdays, Thursdays, and Saturdays.  - Recheck INR in 4 weeks.   Main # [phone] Coumadin Clinic# [phone] * ONLY TAKE TYLENOL AND/OR IBUPROFEN AS NEEDED - TRY NOT TO TAKE EVERY DAY

## 2021-07-31 ENCOUNTER — Other Ambulatory Visit: Payer: Self-pay | Admitting: Physician Assistant

## 2021-07-31 DIAGNOSIS — I10 Essential (primary) hypertension: Secondary | ICD-10-CM

## 2021-07-31 DIAGNOSIS — E785 Hyperlipidemia, unspecified: Secondary | ICD-10-CM

## 2021-08-07 ENCOUNTER — Other Ambulatory Visit: Payer: Self-pay

## 2021-08-07 ENCOUNTER — Ambulatory Visit (INDEPENDENT_AMBULATORY_CARE_PROVIDER_SITE_OTHER): Payer: Self-pay

## 2021-08-07 DIAGNOSIS — I480 Paroxysmal atrial fibrillation: Secondary | ICD-10-CM

## 2021-08-07 DIAGNOSIS — Z5181 Encounter for therapeutic drug level monitoring: Secondary | ICD-10-CM

## 2021-08-07 LAB — POCT INR: INR: 1.6 — AB (ref 2.0–3.0)

## 2021-08-07 NOTE — Patient Instructions (Signed)
-   take 1.5 tablets tonight, then  - continue same dosage of warfarin 1 tablet daily except for 1/2 tablet on Tuesdays, Thursdays, and Saturdays.  - Recheck INR in 3 weeks.   Main # [phone] Coumadin Clinic# [phone]

## 2021-09-04 ENCOUNTER — Other Ambulatory Visit: Payer: Self-pay

## 2021-09-04 ENCOUNTER — Ambulatory Visit (INDEPENDENT_AMBULATORY_CARE_PROVIDER_SITE_OTHER): Payer: Self-pay

## 2021-09-04 DIAGNOSIS — Z5181 Encounter for therapeutic drug level monitoring: Secondary | ICD-10-CM

## 2021-09-04 DIAGNOSIS — I480 Paroxysmal atrial fibrillation: Secondary | ICD-10-CM

## 2021-09-04 LAB — POCT INR: INR: 2.5 (ref 2.0–3.0)

## 2021-09-04 NOTE — Patient Instructions (Signed)
Description   - Continue same dosage of warfarin 1 tablet daily except for 1/2 tablet on Tuesdays, Thursdays, and Saturdays.  - Recheck INR in 4 weeks.   Main # [phone] Coumadin Clinic# [phone] * ONLY TAKE TYLENOL AND/OR IBUPROFEN AS NEEDED - TRY NOT TO TAKE EVERY DAY

## 2021-10-16 ENCOUNTER — Other Ambulatory Visit: Payer: Self-pay

## 2021-10-16 ENCOUNTER — Ambulatory Visit (INDEPENDENT_AMBULATORY_CARE_PROVIDER_SITE_OTHER): Payer: Self-pay

## 2021-10-16 DIAGNOSIS — I480 Paroxysmal atrial fibrillation: Secondary | ICD-10-CM

## 2021-10-16 DIAGNOSIS — Z5181 Encounter for therapeutic drug level monitoring: Secondary | ICD-10-CM

## 2021-10-16 LAB — POCT INR: INR: 1.9 — AB (ref 2.0–3.0)

## 2021-10-16 NOTE — Patient Instructions (Signed)
-   take extra 1/2 tablet warfarin tonight, then - Continue same dosage of warfarin 1 tablet daily except for 1/2 tablet on Tuesdays, Thursdays, and Saturdays.  - Recheck INR in 4 weeks.   Main # [phone] Coumadin Clinic# [phone] * ONLY TAKE TYLENOL AND/OR IBUPROFEN AS NEEDED - TRY NOT TO TAKE EVERY DAY

## 2021-10-25 ENCOUNTER — Other Ambulatory Visit: Payer: Self-pay | Admitting: Interventional Cardiology

## 2021-10-25 DIAGNOSIS — I10 Essential (primary) hypertension: Secondary | ICD-10-CM

## 2021-10-25 DIAGNOSIS — E785 Hyperlipidemia, unspecified: Secondary | ICD-10-CM

## 2021-11-20 ENCOUNTER — Ambulatory Visit: Payer: Self-pay

## 2021-11-20 ENCOUNTER — Other Ambulatory Visit: Payer: Self-pay

## 2021-11-20 DIAGNOSIS — Z5181 Encounter for therapeutic drug level monitoring: Secondary | ICD-10-CM

## 2021-11-20 DIAGNOSIS — I480 Paroxysmal atrial fibrillation: Secondary | ICD-10-CM

## 2021-11-20 LAB — POCT INR: INR: 2 (ref 2.0–3.0)

## 2021-11-20 NOTE — Patient Instructions (Signed)
Description   Take 1.5 tablets today, then resume same dosage of warfarin 1 tablet daily except for 1/2 tablet on Tuesdays, Thursdays, and Saturdays.  Recheck INR in 4 weeks.  Main # [phone] Coumadin Clinic# [phone] * ONLY TAKE TYLENOL AND/OR IBUPROFEN AS NEEDED - TRY NOT TO TAKE EVERY DAY

## 2021-12-25 ENCOUNTER — Other Ambulatory Visit: Payer: Self-pay

## 2021-12-25 ENCOUNTER — Ambulatory Visit (INDEPENDENT_AMBULATORY_CARE_PROVIDER_SITE_OTHER): Payer: Self-pay | Admitting: *Deleted

## 2021-12-25 DIAGNOSIS — Z5181 Encounter for therapeutic drug level monitoring: Secondary | ICD-10-CM

## 2021-12-25 DIAGNOSIS — I480 Paroxysmal atrial fibrillation: Secondary | ICD-10-CM

## 2021-12-25 LAB — POCT INR: INR: 2.2 (ref 2.0–3.0)

## 2021-12-25 NOTE — Patient Instructions (Signed)
Description   Continue same dosage of warfarin 1 tablet daily except for 1/2 tablet on Tuesdays, Thursdays, and Saturdays.  Recheck INR in 5 weeks.  Main # [phone] Coumadin Clinic# [phone] * ONLY TAKE TYLENOL AND/OR IBUPROFEN AS NEEDED - TRY NOT TO TAKE EVERY DAY

## 2022-01-23 ENCOUNTER — Other Ambulatory Visit: Payer: Self-pay | Admitting: Interventional Cardiology

## 2022-01-29 ENCOUNTER — Other Ambulatory Visit: Payer: Self-pay

## 2022-01-29 ENCOUNTER — Ambulatory Visit (INDEPENDENT_AMBULATORY_CARE_PROVIDER_SITE_OTHER): Payer: Self-pay

## 2022-01-29 DIAGNOSIS — Z5181 Encounter for therapeutic drug level monitoring: Secondary | ICD-10-CM

## 2022-01-29 DIAGNOSIS — I480 Paroxysmal atrial fibrillation: Secondary | ICD-10-CM

## 2022-01-29 LAB — POCT INR: INR: 2.3 (ref 2.0–3.0)

## 2022-01-29 NOTE — Patient Instructions (Signed)
Description   ?Continue same dosage of warfarin 1 tablet daily except for 1/2 tablet on Tuesdays, Thursdays, and Saturdays.  Recheck INR in 6 weeks.  Main # [phone] Coumadin Clinic# [phone] * ONLY TAKE TYLENOL AND/OR IBUPROFEN AS NEEDED - TRY NOT TO TAKE EVERY DAY  ?  ?  ?

## 2022-03-12 ENCOUNTER — Ambulatory Visit (INDEPENDENT_AMBULATORY_CARE_PROVIDER_SITE_OTHER): Payer: Self-pay | Admitting: *Deleted

## 2022-03-12 DIAGNOSIS — Z5181 Encounter for therapeutic drug level monitoring: Secondary | ICD-10-CM

## 2022-03-12 DIAGNOSIS — I480 Paroxysmal atrial fibrillation: Secondary | ICD-10-CM

## 2022-03-12 LAB — POCT INR: INR: 2.8 (ref 2.0–3.0)

## 2022-03-12 NOTE — Patient Instructions (Signed)
Description   ?Continue same dosage of warfarin 1 tablet daily except for 1/2 tablet on Tuesdays, Thursdays, and Saturdays.  Recheck INR in 6 weeks.  Main # [phone] Coumadin Clinic# [phone] * ONLY TAKE TYLENOL AND/OR IBUPROFEN AS NEEDED - TRY NOT TO TAKE EVERY DAY  ?  ?  ?

## 2022-04-23 ENCOUNTER — Ambulatory Visit (INDEPENDENT_AMBULATORY_CARE_PROVIDER_SITE_OTHER): Payer: Self-pay | Admitting: *Deleted

## 2022-04-23 DIAGNOSIS — Z5181 Encounter for therapeutic drug level monitoring: Secondary | ICD-10-CM

## 2022-04-23 DIAGNOSIS — I480 Paroxysmal atrial fibrillation: Secondary | ICD-10-CM

## 2022-04-23 LAB — POCT INR: INR: 2.4 (ref 2.0–3.0)

## 2022-04-23 NOTE — Patient Instructions (Signed)
Description   Continue taking warfarin 1 tablet daily except for 1/2 tablet on Tuesdays, Thursdays, and Saturdays.  Recheck INR in 6 weeks.  Main # [phone] Coumadin Clinic# [phone] * ONLY TAKE TYLENOL AND/OR IBUPROFEN AS NEEDED - TRY NOT TO TAKE EVERY DAY       

## 2022-04-24 ENCOUNTER — Other Ambulatory Visit: Payer: Self-pay | Admitting: Internal Medicine

## 2022-05-01 ENCOUNTER — Other Ambulatory Visit: Payer: Self-pay | Admitting: Internal Medicine

## 2022-06-04 ENCOUNTER — Ambulatory Visit (INDEPENDENT_AMBULATORY_CARE_PROVIDER_SITE_OTHER): Payer: Self-pay

## 2022-06-04 DIAGNOSIS — Z5181 Encounter for therapeutic drug level monitoring: Secondary | ICD-10-CM

## 2022-06-04 DIAGNOSIS — I480 Paroxysmal atrial fibrillation: Secondary | ICD-10-CM

## 2022-06-04 LAB — POCT INR: INR: 2.2 (ref 2.0–3.0)

## 2022-06-04 NOTE — Patient Instructions (Signed)
Continue taking warfarin 1 tablet daily except for 1/2 tablet on Tuesdays, Thursdays, and Saturdays.  Recheck INR in 6 weeks.  Main # [phone] Coumadin Clinic# [phone] * ONLY TAKE TYLENOL AND/OR IBUPROFEN AS NEEDED - TRY NOT TO TAKE EVERY DAY

## 2022-06-14 ENCOUNTER — Telehealth: Payer: Self-pay | Admitting: Licensed Clinical Social Worker

## 2022-06-14 NOTE — Telephone Encounter (Signed)
H&V Care Navigation CSW Progress Note  Clinical Social Worker contacted patient by phone to f/u on uninsured status, complete SDOH Screening. No answer at [phone], left voicemail requesting call back. Will re-attempt as able.  Patient is participating in a Managed Medicaid Plan:  No, self pay only.   SDOH Screenings   Alcohol Screen: Not on file  Depression (YJW9-2): Not on file (09/27/2017)  Financial Resource Strain: Not on file  Food Insecurity: Not on file  Housing: Not on file  Physical Activity: Not on file  Social Connections: Not on file  Stress: Not on file  Tobacco Use: High Risk (06/22/2021)   Patient History    Smoking Tobacco Use: Some Days    Smokeless Tobacco Use: Never    Passive Exposure: Not on file  Transportation Needs: Not on file   Octavio Graves, MSW, LCSW Clinical Social Worker II [facility] Heart/Vascular Care Navigation  [phone]- work cell phone (preferred) [phone]- desk phone

## 2022-06-15 ENCOUNTER — Telehealth: Payer: Self-pay | Admitting: Licensed Clinical Social Worker

## 2022-06-15 NOTE — Progress Notes (Signed)
Heart and Vascular Care Navigation  06/15/2022  [name] November 13, 1955 [id number]  Reason for Referral:  Patient is participating in a Managed Medicaid Plan: No, self pay only Uninsured, no PCP Engaged with patient by telephone for initial visit for Heart and Vascular Care Coordination.                                                                                                   Assessment:     LCSW was able to reach pt this afternoon at [phone]. Introduced self, role, reason for call. Pt confirms home address, denies having a current PCP, and confirmed his sister and brother as emergency contacts. Pt currently works, is near [employer] age but doesn't currently have insurance nor is it offered through his employer. Pt interested in CAFA assistance through [facility]. Pt also interested in assistance through [facility] counselors in [place] to assist in enrolling in appropriate Medicare coverage when enrollment window opens. No additional concerns/questions at this time. No additionally shared cost of living concerns.                                   HRT/VAS Care Coordination     Patients Home Cardiology Office [facility]   Outpatient Care Team Social Worker   Social Worker Name: Nile Riggs, [place] [facility] [phone]   Living arrangements for the past 2 months Single Family Home   Lives with: Self   Patient Current Insurance Coverage Self-Pay   Patient Has Concern With Paying Medical Bills Yes   Patient Concerns With Medical Bills no insurance, will be Medicare eligible in December   Medical Bill Referrals: CAFA   Does Patient Have Prescription Coverage? No   Home Assistive Devices/Equipment Blood pressure cuff; Eyeglasses       Social History:                                                                             SDOH Screenings   Alcohol Screen: Not on file  Depression (URK2-7): Not on file (09/27/2017)  Financial Resource Strain: Medium  Risk (06/15/2022)   Overall Financial Resource Strain (CARDIA)    Difficulty of Paying Living Expenses: Somewhat hard  Food Insecurity: No Food Insecurity (06/15/2022)   Hunger Vital Sign    Worried About Running Out of Food in the Last Year: Never true    Ran Out of Food in the Last Year: Never true  Housing: Low Risk  (06/15/2022)   Housing    Last Housing Risk Score: 0  Physical Activity: Not on file  Social Connections: Not on file  Stress: Not on file  Tobacco Use: High Risk (06/22/2021)   Patient History    Smoking Tobacco Use: Some  Days    Smokeless Tobacco Use: Never    Passive Exposure: Not on file  Transportation Needs: No Transportation Needs (06/15/2022)   PRAPARE - Transportation    Lack of Transportation (Medical): No    Lack of Transportation (Non-Medical): No    SDOH Interventions: Financial Resources:  Financial Strain Interventions: Other (Comment) Civil engineer, contracting) Editor, commissioning for [employer] Program  Food Insecurity:  Food Insecurity Interventions: Intervention Not Indicated  Housing Insecurity:  Housing Interventions: Intervention Not Indicated  Transportation:   Transportation Interventions: Intervention Not Indicated     Follow-up plan:   LCSW has mailed the following to pt: my card, PCP list, Haematologist and [employer] counseling information. I will f/u with pt to ensure it has been received and answer any additional questions at that time.

## 2022-06-25 ENCOUNTER — Telehealth: Payer: Self-pay | Admitting: Licensed Clinical Social Worker

## 2022-06-25 NOTE — Telephone Encounter (Signed)
H&V Care Navigation CSW Progress Note  Clinical Social Worker contacted patient by phone to f/u on assistance applications sent/provide assistance. No answer at [phone]. Left voicemail requesting call back should pt have any additional questions/concerns. Will re-attempt as able.   Patient is participating in a Managed Medicaid Plan:  No, self pay only.   SDOH Screenings   Alcohol Screen: Not on file  Depression (OYD7-4): Not on file (09/27/2017)  Financial Resource Strain: Medium Risk (06/15/2022)   Overall Financial Resource Strain (CARDIA)    Difficulty of Paying Living Expenses: Somewhat hard  Food Insecurity: No Food Insecurity (06/15/2022)   Hunger Vital Sign    Worried About Running Out of Food in the Last Year: Never true    Ran Out of Food in the Last Year: Never true  Housing: Low Risk  (06/15/2022)   Housing    Last Housing Risk Score: 0  Physical Activity: Not on file  Social Connections: Not on file  Stress: Not on file  Tobacco Use: High Risk (06/22/2021)   Patient History    Smoking Tobacco Use: Some Days    Smokeless Tobacco Use: Never    Passive Exposure: Not on file  Transportation Needs: No Transportation Needs (06/15/2022)   PRAPARE - Transportation    Lack of Transportation (Medical): No    Lack of Transportation (Non-Medical): No   Octavio Graves, MSW, LCSW Clinical Social Worker II [facility] Heart/Vascular Care Navigation  [phone]- work cell phone (preferred) [phone]- desk phone

## 2022-06-28 ENCOUNTER — Telehealth: Payer: Self-pay | Admitting: Licensed Clinical Social Worker

## 2022-06-28 NOTE — Telephone Encounter (Signed)
H&V Care Navigation CSW Progress Note  Clinical Social Worker contacted patient by phone to f/u assistance applications. No answer at [phone]. I left an additional message for pt- will re-attempt one more time as I have not had any engagement after two calls.   Patient is participating in a Managed Medicaid Plan:  No, self pay only.   SDOH Screenings   Alcohol Screen: Not on file  Depression (XNT7-0): Not on file (09/27/2017)  Financial Resource Strain: Medium Risk (06/15/2022)   Overall Financial Resource Strain (CARDIA)    Difficulty of Paying Living Expenses: Somewhat hard  Food Insecurity: No Food Insecurity (06/15/2022)   Hunger Vital Sign    Worried About Running Out of Food in the Last Year: Never true    Ran Out of Food in the Last Year: Never true  Housing: Low Risk  (06/15/2022)   Housing    Last Housing Risk Score: 0  Physical Activity: Not on file  Social Connections: Not on file  Stress: Not on file  Tobacco Use: High Risk (06/22/2021)   Patient History    Smoking Tobacco Use: Some Days    Smokeless Tobacco Use: Never    Passive Exposure: Not on file  Transportation Needs: No Transportation Needs (06/15/2022)   PRAPARE - Transportation    Lack of Transportation (Medical): No    Lack of Transportation (Non-Medical): No    Octavio Graves, MSW, LCSW Clinical Social Worker II [facility] Heart/Vascular Care Navigation  [phone]- work cell phone (preferred) [phone]- desk phone

## 2022-07-03 ENCOUNTER — Telehealth: Payer: Self-pay | Admitting: Licensed Clinical Social Worker

## 2022-07-03 NOTE — Telephone Encounter (Signed)
H&V Care Navigation CSW Progress Note  Clinical Social Worker contacted patient by phone to f/u on assistance applications and [employer]II[employer] information. No answer, left third and final voicemail at this time. I remain available should pt return my call/wish to engage further for assistance.    Patient is participating in a Managed Medicaid Plan: No, self pay only.   SDOH Screenings   Alcohol Screen: Not on file  Depression (KZS0-1): Not on file (09/27/2017)  Financial Resource Strain: Medium Risk (06/15/2022)   Overall Financial Resource Strain (CARDIA)    Difficulty of Paying Living Expenses: Somewhat hard  Food Insecurity: No Food Insecurity (06/15/2022)   Hunger Vital Sign    Worried About Running Out of Food in the Last Year: Never true    Ran Out of Food in the Last Year: Never true  Housing: Low Risk  (06/15/2022)   Housing    Last Housing Risk Score: 0  Physical Activity: Not on file  Social Connections: Not on file  Stress: Not on file  Tobacco Use: High Risk (06/22/2021)   Patient History    Smoking Tobacco Use: Some Days    Smokeless Tobacco Use: Never    Passive Exposure: Not on file  Transportation Needs: No Transportation Needs (06/15/2022)   PRAPARE - Transportation    Lack of Transportation (Medical): No    Lack of Transportation (Non-Medical): No   Octavio Graves, MSW, LCSW Clinical Social Worker II [facility] Heart/Vascular Care Navigation  [phone]- work cell phone (preferred) [phone]- desk phone

## 2022-07-09 NOTE — Progress Notes (Unsigned)
Cardiology Office Note   Date:  07/11/2022   ID:  Martin, Shields 07-Sep-1956, MRN [id number]  PCP:  Patient, No Pcp Per    No chief complaint on file.  CAD, PAF  Wt Readings from Last 3 Encounters:  07/11/22 173 lb (78.5 kg)  06/22/21 176 lb 3.2 oz (79.9 kg)  05/04/20 178 lb 6.4 oz (80.9 kg)       History of Present Illness: [name] is a 66 y.o. male   with  CAD, PAF, Carotid artery disease, HTN, HLD and subclavian steal syndrome presents for follow up.   Hx of Coronary artery disease status post PCI to the mid RCA in 11/2016 by Dr. Okey Dupre, complicated by right groin pseudoaneurysm.     Cath in 2018 showed: Significant 2 vessel coronary artery disease, including 95% midvessel stenosis of very large RCA, as well as chronic total occlusion of small apical LAD. Mild diffuse disease involving LAD and LCx. Normal left ventricular filling pressure. Normal left ventricular contraction. Successful IVUS-guided PCI of mid RCA with placement of Xience Alpine 3.5 x 28 mm drug-eluting stent (post-dilated with 4.5 mm non-compliant balloon) with 0% residual stenosis and TIMI-3 flow.   Subclavian steal syndrome followed by Dr. Myra Gianotti.   Had torn muscle in right arm in 2021 with hematoma.  Confirmed with MRI.   Not walking much.     Past Medical History:  Diagnosis Date   Carotid artery occlusion    Right Carotid Bruit   Chest pain 2003   neg evaluation   Coronary artery disease    a. 12/10/16 PCI with DES--> RCA, normal EF   History of kidney stones    HTN (hypertension)    Hyperlipemia    Leg pain    a. ABIs (12/10):  Normal   Paroxysmal atrial fibrillation (HCC)    Subclavian steal syndrome Aug. 2015   Occult Right    Past Surgical History:  Procedure Laterality Date   CARDIAC CATHETERIZATION N/A 12/10/2016   Procedure: Left Heart Cath and Coronary Angiography;  Surgeon: Yvonne Kendall, MD;  Location: [facility] INVASIVE CV LAB;  Service: Cardiovascular;   Laterality: N/A;   CARDIAC CATHETERIZATION N/A 12/10/2016   Procedure: Coronary Stent Intervention;  Surgeon: Yvonne Kendall, MD;  Location: [facility] INVASIVE CV LAB;  Service: Cardiovascular;  Laterality: N/A;   CARDIAC CATHETERIZATION N/A 12/10/2016   Procedure: Intravascular Ultrasound/IVUS;  Surgeon: Yvonne Kendall, MD;  Location: [facility] INVASIVE CV LAB;  Service: Cardiovascular;  Laterality: N/A;   COLONOSCOPY  10/2007   negative results   COLONOSCOPY W/ POLYPECTOMY  2003   CORONARY ANGIOPLASTY WITH STENT PLACEMENT  12/10/2016   CYSTECTOMY     removed from back   epidural steroids     in the back x3   JOINT REPLACEMENT Right February 29, 1976   KNEE   REPAIR PATELLAR TENDON Right    SHOULDER ARTHROSCOPY WITH OPEN ROTATOR CUFF REPAIR Right      Current Outpatient Medications  Medication Sig Dispense Refill   clopidogrel (PLAVIX) 75 MG tablet Take 1 tablet by mouth once daily 90 tablet 0   lisinopril (ZESTRIL) 10 MG tablet Take 1 tablet by mouth once daily 90 tablet 2   metoprolol tartrate (LOPRESSOR) 25 MG tablet Take 1 tablet by mouth twice daily 180 tablet 0   nitroGLYCERIN (NITROSTAT) 0.4 MG SL tablet Place 1 tablet (0.4 mg total) under the tongue every 5 (five) minutes as needed for chest pain. 25 tablet 3  omega-3 fish oil (MAXEPA) 1000 MG CAPS capsule Take 1 capsule (1,000 mg total) by mouth 2 (two) times daily. 180 capsule 3   warfarin (COUMADIN) 5 MG tablet TAKE 1/2 TO 1 TABLET BY MOUTH ONCE DAILY AS DIRECTED BY COUMADIN CLINIC 90 tablet 1   No current facility-administered medications for this visit.    Allergies:   Tetanus toxoid    Social History:  The patient  reports that he has been smoking cigarettes. He has a 6.75 pack-year smoking history. He has never used smokeless tobacco. He reports current alcohol use. He reports that he does not use drugs.   Family History:  The patient's family history includes Cancer in his brother; Heart attack in his father; Heart disease in  his father; Stroke in his brother.    ROS:  Please see the history of present illness.   Otherwise, review of systems are positive for .   All other systems are reviewed and negative.    PHYSICAL EXAM: VS:  BP 112/68   Pulse 75   Ht 5\' 8"  (1.727 m)   Wt 173 lb (78.5 kg)   BMI 26.30 kg/m  , BMI Body mass index is 26.3 kg/m. GEN: Well nourished, well developed, in no acute distress HEENT: normal Neck: no JVD, carotid bruits, or masses Cardiac: RRR; no murmurs, rubs, or gallops,no edema  Respiratory:  clear to auscultation bilaterally, normal work of breathing GI: soft, nontender, nondistended, + BS MS: no deformity or atrophy; 2+ radial pulses bilaterally- slight delay in right radial pulse compared to the left Skin: warm and dry, no rash Neuro:  Strength and sensation are intact Psych: euthymic mood, full affect   EKG:   The ekg ordered today demonstrates NSR, left axis deviation, no ST changes   Recent Labs: No results found for requested labs within last 365 days.   Lipid Panel    Component Value Date/Time   CHOL 111 06/22/2021 1010   TRIG 143 06/22/2021 1010   HDL 38 (L) 06/22/2021 1010   CHOLHDL 2.9 06/22/2021 1010   CHOLHDL 5.1 CALC 10/04/2008 0847   VLDL 28 10/04/2008 0847   LDLCALC 48 06/22/2021 1010   LDLDIRECT 120.9 10/04/2008 0847     Other studies Reviewed: Additional studies/ records that were reviewed today with results demonstrating: labs reviewed.   ASSESSMENT AND PLAN:  CAD: Prior RCA stent. No angina on medical therapy. Continue aggressive secondary prevention.  PAD: Right subclavian disease.  He follows with [facility].  He reported some fatigue in the arm previously  Due for Doppler.   HTN: Low-salt diet.  Avoid processed foods. Hyperlipidemia: Whole food, plant-based diet.  LDL 48 in 8/22.  Restart atorvastatin 40 mg daily and then in mid November, can check liver and lipid tests. PAF: Acquired thrombophilia in the setting of atrial fibrillation.   Warfarin for stroke prevention. No bleeding issues.  INR has been steady.  Tobacco abuse: Worse with stress.  Offered nicotine patch.  Not interested.     Current medicines are reviewed at length with the patient today.  The patient concerns regarding his medicines were addressed.  The following changes have been made:    Labs/ tests ordered today include: CBC, CMet and lipids in 11/23 No orders of the defined types were placed in this encounter.   Recommend 150 minutes/week of aerobic exercise Low fat, low carb, high fiber diet recommended  Disposition:   FU in 1 year   Signed, 12/23, MD  07/11/2022 10:46 AM  [facility] [place], [place], [place]  [address] Phone: [phone]; Fax: [phone]

## 2022-07-11 ENCOUNTER — Ambulatory Visit: Payer: Self-pay | Attending: Interventional Cardiology | Admitting: Interventional Cardiology

## 2022-07-11 ENCOUNTER — Encounter: Payer: Self-pay | Admitting: Interventional Cardiology

## 2022-07-11 VITALS — BP 112/68 | HR 75 | Ht 68.0 in | Wt 173.0 lb

## 2022-07-11 DIAGNOSIS — D6869 Other thrombophilia: Secondary | ICD-10-CM

## 2022-07-11 DIAGNOSIS — I480 Paroxysmal atrial fibrillation: Secondary | ICD-10-CM

## 2022-07-11 DIAGNOSIS — E782 Mixed hyperlipidemia: Secondary | ICD-10-CM

## 2022-07-11 DIAGNOSIS — I2510 Atherosclerotic heart disease of native coronary artery without angina pectoris: Secondary | ICD-10-CM

## 2022-07-11 DIAGNOSIS — I10 Essential (primary) hypertension: Secondary | ICD-10-CM

## 2022-07-11 DIAGNOSIS — Z720 Tobacco use: Secondary | ICD-10-CM

## 2022-07-11 MED ORDER — ATORVASTATIN CALCIUM 40 MG PO TABS: 40.0000 mg | ORAL_TABLET | Freq: Every day | ORAL | 3 refills | Status: DC

## 2022-07-11 NOTE — Patient Instructions (Signed)
Medication Instructions:  Your physician has recommended you make the following change in your medication: Start Atorvastatin 40 mg by mouth daily  *If you need a refill on your cardiac medications before your next appointment, please call your pharmacy*   Lab Work: Your physician recommends that you return for lab work on October 01, 2022.  CBC, CMET and lipids.  This will be fasting.  The lab opens at 7:15 AM  If you have labs (blood work) drawn today and your tests are completely normal, you will receive your results only by: MyChart Message (if you have MyChart) OR A paper copy in the mail If you have any lab test that is abnormal or we need to change your treatment, we will call you to review the results.   Testing/Procedures: none   Follow-Up: At [facility], you and your health needs are our priority.  As part of our continuing mission to provide you with exceptional heart care, we have created designated Provider Care Teams.  These Care Teams include your primary Cardiologist (physician) and Advanced Practice Providers (APPs -  Physician Assistants and Nurse Practitioners) who all work together to provide you with the care you need, when you need it.  We recommend signing up for the patient portal called "MyChart".  Sign up information is provided on this After Visit Summary.  MyChart is used to connect with patients for Virtual Visits (Telemedicine).  Patients are able to view lab/test results, encounter notes, upcoming appointments, etc.  Non-urgent messages can be sent to your provider as well.   To learn more about what you can do with MyChart, go to [email or web address].    Your next appointment:   12 month(s)  The format for your next appointment:   In Person  Provider:   Lance Muss, MD     Other Instructions    Important Information About Sugar

## 2022-07-18 ENCOUNTER — Ambulatory Visit: Payer: Self-pay

## 2022-07-22 ENCOUNTER — Other Ambulatory Visit: Payer: Self-pay | Admitting: Interventional Cardiology

## 2022-07-22 DIAGNOSIS — E785 Hyperlipidemia, unspecified: Secondary | ICD-10-CM

## 2022-07-22 DIAGNOSIS — I10 Essential (primary) hypertension: Secondary | ICD-10-CM

## 2022-07-23 ENCOUNTER — Ambulatory Visit: Payer: Self-pay | Attending: Internal Medicine | Admitting: *Deleted

## 2022-07-23 DIAGNOSIS — I480 Paroxysmal atrial fibrillation: Secondary | ICD-10-CM

## 2022-07-23 DIAGNOSIS — Z5181 Encounter for therapeutic drug level monitoring: Secondary | ICD-10-CM

## 2022-07-23 LAB — POCT INR: INR: 2.8 (ref 2.0–3.0)

## 2022-07-23 NOTE — Patient Instructions (Signed)
Description   Continue taking warfarin 1 tablet daily except for 1/2 tablet on Tuesdays, Thursdays, and Saturdays.  Recheck INR in 6 weeks.  Main # [phone] Coumadin Clinic# [phone] * ONLY TAKE TYLENOL AND/OR IBUPROFEN AS NEEDED - TRY NOT TO TAKE EVERY DAY       

## 2022-07-30 ENCOUNTER — Other Ambulatory Visit: Payer: Self-pay | Admitting: Interventional Cardiology

## 2022-07-30 ENCOUNTER — Other Ambulatory Visit: Payer: Self-pay | Admitting: *Deleted

## 2022-07-30 MED ORDER — CLOPIDOGREL BISULFATE 75 MG PO TABS: 75.0000 mg | ORAL_TABLET | Freq: Every day | ORAL | 3 refills | Status: DC

## 2022-07-30 MED ORDER — METOPROLOL TARTRATE 25 MG PO TABS: 25.0000 mg | ORAL_TABLET | Freq: Two times a day (BID) | ORAL | 3 refills | Status: DC

## 2022-08-03 ENCOUNTER — Telehealth: Payer: Self-pay | Admitting: *Deleted

## 2022-08-03 NOTE — Telephone Encounter (Signed)
Pt called asking if he can hold his warfarin for 5 days because his ear has been bleeding since Monday. Pt stated that he saw the ENT twice this week and is suppose to go back on Tuesday (08/07/2022) to have a ear wick removed. Informed pt that in Dr. Trish Mage note (from 08/03/2022 [facility] everywhere)  that there is no documentation about him holding his warfarin. Discussed with pharm D, chris.   Informed pt that if Dr. Blenda Nicely wants him to hold his warfarin then she will need to send a request.   Pt also stated that he is having bruising on his arm and he does not know how he got the bruise. Pt stated that the last time he had a bruise like this, 4 years ago,  he thinks he had " ruptured a tendon or something." -Advised pt to seek medical attention. Pt stated he did not want to go to urgent care or ER.   Pt stated he "wonders if he could of doubled up on his warfarin last week".   Scheduled pt on Monday (08/06/2022) to have INR checked.

## 2022-08-06 ENCOUNTER — Ambulatory Visit: Payer: Self-pay | Attending: Internal Medicine

## 2022-08-06 DIAGNOSIS — Z5181 Encounter for therapeutic drug level monitoring: Secondary | ICD-10-CM

## 2022-08-06 DIAGNOSIS — I480 Paroxysmal atrial fibrillation: Secondary | ICD-10-CM

## 2022-08-06 LAB — POCT INR: INR: 2.9 (ref 2.0–3.0)

## 2022-08-06 NOTE — Patient Instructions (Signed)
Description   Continue taking warfarin 1 tablet daily except for 1/2 tablet on Tuesdays, Thursdays, and Saturdays.  Recheck INR in 6 weeks.  Main # [phone] Coumadin Clinic# [phone] * ONLY TAKE TYLENOL AND/OR IBUPROFEN AS NEEDED - TRY NOT TO TAKE EVERY DAY

## 2022-09-17 ENCOUNTER — Telehealth: Payer: Self-pay | Admitting: Licensed Clinical Social Worker

## 2022-09-17 ENCOUNTER — Ambulatory Visit: Payer: Self-pay | Attending: Cardiology

## 2022-09-17 DIAGNOSIS — Z5181 Encounter for therapeutic drug level monitoring: Secondary | ICD-10-CM

## 2022-09-17 DIAGNOSIS — I480 Paroxysmal atrial fibrillation: Secondary | ICD-10-CM

## 2022-09-17 LAB — POCT INR: INR: 3.2 — AB (ref 2.0–3.0)

## 2022-09-17 NOTE — Patient Instructions (Signed)
Continue taking warfarin 1 tablet daily except for 1/2 tablet on Tuesdays, Thursdays, and Saturdays.  Recheck INR in 4 weeks.  Main # [phone] Coumadin Clinic# [phone] * ONLY TAKE TYLENOL AND/OR IBUPROFEN AS NEEDED - TRY NOT TO TAKE EVERY DAY; EAT GREENS TONIGHT;

## 2022-09-17 NOTE — Telephone Encounter (Signed)
H&V Care Navigation CSW Progress Note  Clinical Social Worker  mailed pt a packet of information from [place] counseling  to encourage pt to enroll in Medicare benefits if he hasn't already. Pt previously had been contacted in an attempt to engage with pt assistance programs with little return engagement. I remain available if needed.   Patient is participating in a Managed Medicaid Plan:  No, self pay only.   SDOH Screenings   Food Insecurity: No Food Insecurity (06/15/2022)  Housing: Low Risk  (06/15/2022)  Transportation Needs: No Transportation Needs (06/15/2022)  Financial Resource Strain: Medium Risk (06/15/2022)  Tobacco Use: High Risk (07/11/2022)   Westley Hummer, MSW, [place]  [phone]- work cell phone (preferred) [phone]- desk phone

## 2022-10-01 ENCOUNTER — Ambulatory Visit: Payer: Self-pay

## 2022-10-15 ENCOUNTER — Ambulatory Visit: Payer: Medicare HMO | Attending: Cardiology

## 2022-10-15 ENCOUNTER — Ambulatory Visit: Payer: Medicare HMO

## 2022-10-15 DIAGNOSIS — I480 Paroxysmal atrial fibrillation: Secondary | ICD-10-CM

## 2022-10-15 DIAGNOSIS — I10 Essential (primary) hypertension: Secondary | ICD-10-CM | POA: Diagnosis not present

## 2022-10-15 DIAGNOSIS — E782 Mixed hyperlipidemia: Secondary | ICD-10-CM | POA: Diagnosis not present

## 2022-10-15 DIAGNOSIS — I2510 Atherosclerotic heart disease of native coronary artery without angina pectoris: Secondary | ICD-10-CM

## 2022-10-15 DIAGNOSIS — Z5181 Encounter for therapeutic drug level monitoring: Secondary | ICD-10-CM

## 2022-10-15 LAB — POCT INR: INR: 2.3 (ref 2.0–3.0)

## 2022-10-15 NOTE — Patient Instructions (Signed)
Description   Continue taking warfarin 1 tablet daily except for 1/2 tablet on Tuesdays, Thursdays, and Saturdays.  Recheck INR in 5 weeks.   Main # [phone] Coumadin Clinic# [phone] * ONLY TAKE TYLENOL AND/OR IBUPROFEN AS NEEDED - TRY NOT TO TAKE EVERY DAY      

## 2022-10-16 LAB — COMPREHENSIVE METABOLIC PANEL
ALT: 11 IU/L (ref 0–44)
AST: 17 IU/L (ref 0–40)
Albumin/Globulin Ratio: 2.6 — ABNORMAL HIGH (ref 1.2–2.2)
Albumin: 4.5 g/dL (ref 3.9–4.9)
Alkaline Phosphatase: 76 IU/L (ref 44–121)
BUN/Creatinine Ratio: 12 (ref 10–24)
BUN: 11 mg/dL (ref 8–27)
Bilirubin Total: 0.5 mg/dL (ref 0.0–1.2)
CO2: 25 mmol/L (ref 20–29)
Calcium: 9.8 mg/dL (ref 8.6–10.2)
Chloride: 105 mmol/L (ref 96–106)
Creatinine, Ser: 0.89 mg/dL (ref 0.76–1.27)
Globulin, Total: 1.7 g/dL (ref 1.5–4.5)
Glucose: 102 mg/dL — ABNORMAL HIGH (ref 70–99)
Potassium: 4.1 mmol/L (ref 3.5–5.2)
Sodium: 141 mmol/L (ref 134–144)
Total Protein: 6.2 g/dL (ref 6.0–8.5)
eGFR: 96 mL/min/{1.73_m2} (ref >59)

## 2022-10-16 LAB — CBC
Hematocrit: 45.3 % (ref 37.5–51.0)
Hemoglobin: 15.4 g/dL (ref 13.0–17.7)
MCH: 30.4 pg (ref 26.6–33.0)
MCHC: 34 g/dL (ref 31.5–35.7)
MCV: 90 fL (ref 79–97)
Platelets: 269 10*3/uL (ref 150–450)
RBC: 5.06 x10E6/uL (ref 4.14–5.80)
RDW: 13 % (ref 11.6–15.4)
WBC: 12.4 10*3/uL — ABNORMAL HIGH (ref 3.4–10.8)

## 2022-10-16 LAB — LIPID PANEL
Chol/HDL Ratio: 2.9 ratio (ref 0.0–5.0)
Cholesterol, Total: 109 mg/dL (ref 100–199)
HDL: 37 mg/dL — ABNORMAL LOW (ref >39)
LDL Chol Calc (NIH): 42 mg/dL (ref 0–99)
Triglycerides: 179 mg/dL — ABNORMAL HIGH (ref 0–149)
VLDL Cholesterol Cal: 30 mg/dL (ref 5–40)

## 2022-10-18 DIAGNOSIS — R234 Changes in skin texture: Secondary | ICD-10-CM | POA: Diagnosis not present

## 2022-10-18 DIAGNOSIS — L738 Other specified follicular disorders: Secondary | ICD-10-CM | POA: Diagnosis not present

## 2022-10-18 DIAGNOSIS — L821 Other seborrheic keratosis: Secondary | ICD-10-CM | POA: Diagnosis not present

## 2022-10-18 DIAGNOSIS — L719 Rosacea, unspecified: Secondary | ICD-10-CM | POA: Diagnosis not present

## 2022-10-18 DIAGNOSIS — L732 Hidradenitis suppurativa: Secondary | ICD-10-CM | POA: Diagnosis not present

## 2022-11-19 ENCOUNTER — Ambulatory Visit: Payer: Medicare HMO | Attending: Cardiology

## 2022-11-19 DIAGNOSIS — H903 Sensorineural hearing loss, bilateral: Secondary | ICD-10-CM | POA: Diagnosis not present

## 2022-11-19 DIAGNOSIS — H9193 Unspecified hearing loss, bilateral: Secondary | ICD-10-CM | POA: Diagnosis not present

## 2022-12-05 ENCOUNTER — Ambulatory Visit: Payer: Medicare HMO | Attending: Cardiovascular Disease | Admitting: *Deleted

## 2022-12-05 DIAGNOSIS — Z5181 Encounter for therapeutic drug level monitoring: Secondary | ICD-10-CM

## 2022-12-05 DIAGNOSIS — I480 Paroxysmal atrial fibrillation: Secondary | ICD-10-CM | POA: Diagnosis not present

## 2022-12-05 LAB — POCT INR: INR: 4.2 — AB (ref 2.0–3.0)

## 2022-12-05 NOTE — Patient Instructions (Signed)
Description   Do not take any warfarin today and no warfarin tomorrow then continue taking warfarin 1 tablet daily except for 1/2 tablet on Tuesdays, Thursdays, and Saturdays.  Recheck INR in 2 weeks.  Main # [phone] Coumadin Clinic# [phone] * ONLY TAKE TYLENOL AND/OR IBUPROFEN AS NEEDED - TRY NOT TO TAKE EVERY DAY

## 2022-12-17 ENCOUNTER — Ambulatory Visit: Payer: Medicare HMO | Attending: Cardiology | Admitting: *Deleted

## 2022-12-17 DIAGNOSIS — I480 Paroxysmal atrial fibrillation: Secondary | ICD-10-CM | POA: Diagnosis not present

## 2022-12-17 DIAGNOSIS — Z5181 Encounter for therapeutic drug level monitoring: Secondary | ICD-10-CM | POA: Diagnosis not present

## 2022-12-17 LAB — POCT INR: INR: 2.3 (ref 2.0–3.0)

## 2022-12-17 NOTE — Patient Instructions (Signed)
Description   Continue taking warfarin 1 tablet daily except for 1/2 tablet on Tuesdays, Thursdays, and Saturdays.  Recheck INR in 4 weeks.  Main # [phone] Coumadin Clinic# [phone] * ONLY TAKE TYLENOL AND/OR IBUPROFEN AS NEEDED - TRY NOT TO TAKE EVERY DAY

## 2023-01-05 ENCOUNTER — Other Ambulatory Visit: Payer: Self-pay | Admitting: Interventional Cardiology

## 2023-01-05 DIAGNOSIS — E785 Hyperlipidemia, unspecified: Secondary | ICD-10-CM

## 2023-01-05 DIAGNOSIS — I10 Essential (primary) hypertension: Secondary | ICD-10-CM

## 2023-01-14 ENCOUNTER — Ambulatory Visit: Payer: Medicare HMO

## 2023-01-21 ENCOUNTER — Other Ambulatory Visit: Payer: Self-pay | Admitting: Interventional Cardiology

## 2023-01-21 ENCOUNTER — Ambulatory Visit: Payer: Medicare HMO | Attending: Internal Medicine

## 2023-01-21 DIAGNOSIS — I480 Paroxysmal atrial fibrillation: Secondary | ICD-10-CM

## 2023-01-21 DIAGNOSIS — Z5181 Encounter for therapeutic drug level monitoring: Secondary | ICD-10-CM | POA: Diagnosis not present

## 2023-01-21 LAB — POCT INR: INR: 2.4 (ref 2.0–3.0)

## 2023-01-21 NOTE — Patient Instructions (Signed)
Description   Continue taking warfarin 1 tablet daily except for 1/2 tablet on Tuesdays, Thursdays, and Saturdays.  Recheck INR in 5 weeks.   Main # [phone] Coumadin Clinic# [phone] * ONLY TAKE TYLENOL AND/OR IBUPROFEN AS NEEDED - TRY NOT TO TAKE EVERY DAY

## 2023-02-25 ENCOUNTER — Ambulatory Visit: Payer: Medicare HMO | Attending: Internal Medicine | Admitting: Pharmacist

## 2023-02-25 DIAGNOSIS — I480 Paroxysmal atrial fibrillation: Secondary | ICD-10-CM

## 2023-02-25 DIAGNOSIS — Z5181 Encounter for therapeutic drug level monitoring: Secondary | ICD-10-CM

## 2023-02-25 LAB — POCT INR: POC INR: 3.2

## 2023-02-25 NOTE — Patient Instructions (Signed)
Take 1/2 tablet today then continue taking warfarin 1 tablet daily except for 1/2 tablet on Tuesdays, Thursdays, and Saturdays.  Recheck INR in 4 weeks.   Main # [phone] Coumadin Clinic# [phone] * ONLY TAKE TYLENOL AND/OR IBUPROFEN AS NEEDED - TRY NOT TO TAKE EVERY DAY

## 2023-03-25 ENCOUNTER — Ambulatory Visit: Payer: Medicare HMO | Attending: Internal Medicine

## 2023-03-25 DIAGNOSIS — Z5181 Encounter for therapeutic drug level monitoring: Secondary | ICD-10-CM | POA: Diagnosis not present

## 2023-03-25 DIAGNOSIS — I480 Paroxysmal atrial fibrillation: Secondary | ICD-10-CM

## 2023-03-25 LAB — POCT INR: INR: 3.1 — AB (ref 2.0–3.0)

## 2023-03-25 NOTE — Patient Instructions (Signed)
continue taking warfarin 1 tablet daily except for 1/2 tablet on Tuesdays, Thursdays, and Saturdays.  Recheck INR in 4 weeks.  Eat greens tonight Main # [phone] Coumadin Clinic# [phone] * ONLY TAKE TYLENOL AND/OR IBUPROFEN AS NEEDED - TRY NOT TO TAKE EVERY DAY

## 2023-04-09 ENCOUNTER — Other Ambulatory Visit: Payer: Self-pay | Admitting: Interventional Cardiology

## 2023-04-09 DIAGNOSIS — E785 Hyperlipidemia, unspecified: Secondary | ICD-10-CM

## 2023-04-09 DIAGNOSIS — I10 Essential (primary) hypertension: Secondary | ICD-10-CM

## 2023-04-16 DIAGNOSIS — D225 Melanocytic nevi of trunk: Secondary | ICD-10-CM | POA: Diagnosis not present

## 2023-04-16 DIAGNOSIS — L578 Other skin changes due to chronic exposure to nonionizing radiation: Secondary | ICD-10-CM | POA: Diagnosis not present

## 2023-04-16 DIAGNOSIS — L821 Other seborrheic keratosis: Secondary | ICD-10-CM | POA: Diagnosis not present

## 2023-04-16 DIAGNOSIS — L739 Follicular disorder, unspecified: Secondary | ICD-10-CM | POA: Diagnosis not present

## 2023-04-16 DIAGNOSIS — L719 Rosacea, unspecified: Secondary | ICD-10-CM | POA: Diagnosis not present

## 2023-04-22 ENCOUNTER — Ambulatory Visit: Payer: Medicare HMO | Attending: Internal Medicine

## 2023-04-22 DIAGNOSIS — Z5181 Encounter for therapeutic drug level monitoring: Secondary | ICD-10-CM

## 2023-04-22 DIAGNOSIS — I480 Paroxysmal atrial fibrillation: Secondary | ICD-10-CM

## 2023-04-22 LAB — POCT INR: INR: 2.5 (ref 2.0–3.0)

## 2023-04-22 NOTE — Patient Instructions (Signed)
continue taking warfarin 1 tablet daily except for 1/2 tablet on Tuesdays, Thursdays, and Saturdays.  Recheck INR in 6 weeks.   Main # [phone] Coumadin Clinic# [phone] * ONLY TAKE TYLENOL AND/OR IBUPROFEN AS NEEDED - TRY NOT TO TAKE EVERY DAY

## 2023-04-26 ENCOUNTER — Other Ambulatory Visit: Payer: Self-pay | Admitting: Interventional Cardiology

## 2023-04-26 DIAGNOSIS — I480 Paroxysmal atrial fibrillation: Secondary | ICD-10-CM

## 2023-04-26 NOTE — Telephone Encounter (Signed)
Prescription refill request received for warfarin Lov:  07/11/22 [name])  Next INR check: 06/03/23 Warfarin tablet strength: 5mg   Appropriate dose. Refill sent.

## 2023-06-03 ENCOUNTER — Ambulatory Visit: Payer: Medicare HMO | Attending: Cardiology | Admitting: *Deleted

## 2023-06-03 DIAGNOSIS — I480 Paroxysmal atrial fibrillation: Secondary | ICD-10-CM | POA: Diagnosis not present

## 2023-06-03 DIAGNOSIS — Z5181 Encounter for therapeutic drug level monitoring: Secondary | ICD-10-CM

## 2023-06-03 LAB — POCT INR: INR: 3.1 — AB (ref 2.0–3.0)

## 2023-06-03 NOTE — Patient Instructions (Signed)
Description   Today take 1/2 tablet of warfarin then continue taking warfarin 1 tablet daily except for 1/2 tablet on Tuesdays, Thursdays, and Saturdays.  Recheck INR in 5 weeks.   Main # [phone] Coumadin Clinic# [phone] * ONLY TAKE TYLENOL AND/OR IBUPROFEN AS NEEDED - TRY NOT TO TAKE EVERY DAY

## 2023-07-08 ENCOUNTER — Ambulatory Visit: Payer: Medicare HMO

## 2023-07-08 DIAGNOSIS — I480 Paroxysmal atrial fibrillation: Secondary | ICD-10-CM | POA: Diagnosis not present

## 2023-07-08 DIAGNOSIS — Z5181 Encounter for therapeutic drug level monitoring: Secondary | ICD-10-CM | POA: Diagnosis not present

## 2023-07-08 LAB — POCT INR: INR: 2.7 (ref 2.0–3.0)

## 2023-07-08 NOTE — Patient Instructions (Signed)
Description   Continue taking warfarin 1 tablet daily except for 1/2 tablet on Tuesdays, Thursdays, and Saturdays.   Recheck INR in 7 weeks.   Main # [phone] Coumadin Clinic# [phone] * ONLY TAKE TYLENOL AND/OR IBUPROFEN AS NEEDED - TRY NOT TO TAKE EVERY DAY

## 2023-07-10 ENCOUNTER — Other Ambulatory Visit: Payer: Self-pay | Admitting: Interventional Cardiology

## 2023-07-10 DIAGNOSIS — I10 Essential (primary) hypertension: Secondary | ICD-10-CM

## 2023-07-10 DIAGNOSIS — E785 Hyperlipidemia, unspecified: Secondary | ICD-10-CM

## 2023-07-16 ENCOUNTER — Other Ambulatory Visit: Payer: Self-pay | Admitting: Interventional Cardiology

## 2023-07-16 DIAGNOSIS — E785 Hyperlipidemia, unspecified: Secondary | ICD-10-CM

## 2023-07-16 DIAGNOSIS — I480 Paroxysmal atrial fibrillation: Secondary | ICD-10-CM

## 2023-07-16 DIAGNOSIS — I10 Essential (primary) hypertension: Secondary | ICD-10-CM

## 2023-07-23 ENCOUNTER — Telehealth: Payer: Self-pay | Admitting: Interventional Cardiology

## 2023-07-23 DIAGNOSIS — I10 Essential (primary) hypertension: Secondary | ICD-10-CM

## 2023-07-23 DIAGNOSIS — E785 Hyperlipidemia, unspecified: Secondary | ICD-10-CM

## 2023-07-23 MED ORDER — LISINOPRIL 10 MG PO TABS: 10.0000 mg | ORAL_TABLET | Freq: Every day | ORAL | 1 refills | Status: DC

## 2023-07-23 NOTE — Telephone Encounter (Signed)
 *  STAT* If patient is at the pharmacy, call can be transferred to refill team.   1. Which medications need to be refilled? (please list name of each medication and dose if known)   lisinopril (ZESTRIL) 10 MG tablet    2. Would you like to learn more about the convenience, safety, & potential cost savings by using the [facility]?    3. Are you open to using the [facility] (Type [facility].  ).   4. Which pharmacy/location (including street and city if local pharmacy) is medication to be sent to?  [facility] - [place], [place] - [address]    5. Do they need a 30 day or 90 day supply?  30 day  Patient has appt for 08/26/24 with Herma Carson

## 2023-07-23 NOTE — Telephone Encounter (Signed)
Pt's medication was sent to pt's pharmacy as requested. Confirmation received.  °

## 2023-08-16 ENCOUNTER — Other Ambulatory Visit: Payer: Self-pay | Admitting: Interventional Cardiology

## 2023-08-23 NOTE — Progress Notes (Signed)
Cardiology Office Note:  .   Date:  08/27/2023  ID:  [name], DOB [birth year], MRN [id number] PCP: Patient, No Pcp Per  [facility] Providers Cardiologist:  Christell Constant, MD    History of Present Illness: .   [name] is a 67 y.o. male with history of CAD status post PCI to the mid RCA in 11/2016 by Dr. Okey Dupre, complicated by right groin pseudoaneurysm.  , PAF, Carotid artery disease, HTN, HLD and subclavian steal syndrome  Patient comes in for yearly f/u. Denies chest pain, dyspnea, palptiations, edema. Bought a total gym but hasn't started using it. Has a lot of leg pain when he walks worse in the past few months. Can't walk up/down stairs 4 times and has to sit down.[name] He got an e-bike and pedals somes-goes about 4 miles. Manages a motorcycle dealership. Smokes 12 cigarettes a day. He had quit for 2 1/2 yrs and started back a couple years ago. Hasn't f/u with [facility] since 2021.no recent blood work.   ROS:    Studies Reviewed: [name]    EKG Interpretation Date/Time:  Tuesday August 27 2023 11:03:25 EDT Ventricular Rate:  68 PR Interval:  144 QRS Duration:  92 QT Interval:  420 QTC Calculation: 446 R Axis:   2  Text Interpretation: Normal sinus rhythm Normal ECG When compared with ECG of 13-Mar-2020 15:59, PREVIOUS ECG IS PRESENT Confirmed by Jacolyn Reedy [phone]) on 08/27/2023 11:06:33 AM    Prior CV Studies:   Cath in 2018 showed: Significant 2 vessel coronary artery disease, including 95% midvessel stenosis of very large RCA, as well as chronic total occlusion of small apical LAD. Mild diffuse disease involving LAD and LCx. Normal left ventricular filling pressure. Normal left ventricular contraction. Successful IVUS-guided PCI of mid RCA with placement of Xience Alpine 3.5 x 28 mm drug-eluting stent (post-dilated with 4.5 mm non-compliant balloon) with 0% residual stenosis and TIMI-3 flow.   Subclavian steal syndrome followed by Dr. Myra Gianotti  Risk  Assessment/Calculations:    CHA2DS2-VASc Score = 3   This indicates a 3.2% annual risk of stroke. The patient's score is based upon: CHF History: 0 HTN History: 1 Diabetes History: 0 Stroke History: 0 Vascular Disease History: 1 Age Score: 1 Gender Score: 0            Physical Exam:   VS:  BP 110/78   Pulse 68   Ht 5\' 8"  (1.727 m)   Wt 173 lb 9.6 oz (78.7 kg)   SpO2 99%   BMI 26.40 kg/m    Wt Readings from Last 3 Encounters:  08/27/23 173 lb 9.6 oz (78.7 kg)  07/11/22 173 lb (78.5 kg)  06/22/21 176 lb 3.2 oz (79.9 kg)    GEN: Well nourished, well developed in no acute distress NECK: right carotid bruit, No JVD;   CARDIAC:  RRR, no murmurs, rubs, gallops RESPIRATORY:  Clear to auscultation without rales, wheezing or rhonchi  ABDOMEN: Soft, non-tender, non-distended EXTREMITIES:  No edema; No deformity   ASSESSMENT AND PLAN: .   CAD: Prior RCA stent. No angina on medical therapy. Continue aggressive secondary prevention.   PAD: Right subclavian disease.  He needs to follow up with [facility]-Dr. Myra Gianotti.  Over Due for Doppler-refer back.   -claudication symptoms will order LE arterial dopplers.  HTN: Low-salt diet.  Avoid processed foods.  Hyperlipidemia:due for FLP today Whole food, plant-based diet.  LDL 42 in 10/2022 on atorvastatin 40 mg daily   PAF:no recent  palpitations on metoprolol.  Warfarin for stroke prevention. No bleeding issues.  INR has been steady.   Tobacco abuse: smoking 12 daily, willing to try nicotine patch.   .             Dispo: f/u in 1 yr.  Signed, Jacolyn Reedy, PA-C

## 2023-08-26 ENCOUNTER — Telehealth: Payer: Self-pay | Admitting: *Deleted

## 2023-08-26 ENCOUNTER — Ambulatory Visit: Payer: Medicare HMO

## 2023-08-26 NOTE — Telephone Encounter (Signed)
Called pt since he missed appt today; there was no answer so left a message for him to call back. Will await.

## 2023-08-27 ENCOUNTER — Ambulatory Visit: Payer: Medicare HMO | Attending: Physician Assistant | Admitting: Physician Assistant

## 2023-08-27 ENCOUNTER — Encounter: Payer: Self-pay | Admitting: Physician Assistant

## 2023-08-27 VITALS — BP 110/78 | HR 68 | Ht 68.0 in | Wt 173.6 lb

## 2023-08-27 DIAGNOSIS — Z720 Tobacco use: Secondary | ICD-10-CM | POA: Diagnosis not present

## 2023-08-27 DIAGNOSIS — I739 Peripheral vascular disease, unspecified: Secondary | ICD-10-CM | POA: Diagnosis not present

## 2023-08-27 DIAGNOSIS — I2510 Atherosclerotic heart disease of native coronary artery without angina pectoris: Secondary | ICD-10-CM

## 2023-08-27 DIAGNOSIS — I10 Essential (primary) hypertension: Secondary | ICD-10-CM

## 2023-08-27 DIAGNOSIS — I480 Paroxysmal atrial fibrillation: Secondary | ICD-10-CM | POA: Diagnosis not present

## 2023-08-27 DIAGNOSIS — E785 Hyperlipidemia, unspecified: Secondary | ICD-10-CM | POA: Diagnosis not present

## 2023-08-27 MED ORDER — CLOPIDOGREL BISULFATE 75 MG PO TABS: 75.0000 mg | ORAL_TABLET | Freq: Every day | ORAL | 3 refills | Status: DC

## 2023-08-27 MED ORDER — ATORVASTATIN CALCIUM 40 MG PO TABS: 40.0000 mg | ORAL_TABLET | Freq: Every day | ORAL | 3 refills | Status: DC

## 2023-08-27 MED ORDER — NICOTINE 21-14-7 MG/24HR TD KIT: PACK | TRANSDERMAL | 0 refills | Status: DC

## 2023-08-27 MED ORDER — NITROGLYCERIN 0.4 MG SL SUBL: 0.4000 mg | SUBLINGUAL_TABLET | SUBLINGUAL | 3 refills | Status: DC | PRN

## 2023-08-27 MED ORDER — METOPROLOL TARTRATE 25 MG PO TABS: 25.0000 mg | ORAL_TABLET | Freq: Two times a day (BID) | ORAL | 3 refills | Status: DC

## 2023-08-27 MED ORDER — LISINOPRIL 10 MG PO TABS: 10.0000 mg | ORAL_TABLET | Freq: Every day | ORAL | 3 refills | Status: DC

## 2023-08-27 NOTE — Patient Instructions (Signed)
Medication Instructions:  Start Nicotine Patch, Apply 21mg  patch one daily for 6 weeks (dispense #42), then change to 14mg  patch one daily for 2 weeks (dispense #14), then change to 7mg  patch one daily for 2 weeks (dispense #14). Remove old patch before applying new one.   *If you need a refill on your cardiac medications before your next appointment, please call your pharmacy*   Lab Work: CMET, LIPIDS, CBC- Today   If you have labs (blood work) drawn today and your tests are completely normal, you will receive your results only by: MyChart Message (if you have MyChart) OR A paper copy in the mail If you have any lab test that is abnormal or we need to change your treatment, we will call you to review the results.   Testing/Procedures: Your physician has requested that you have a lower extremity arterial exercise duplex. During this test, exercise and ultrasound are used to evaluate arterial blood flow in the legs. Allow one hour for this exam. There are no restrictions or special instructions.  Your physician has referred you back to Dr. Coral Else at [facility]    Follow-Up: At [facility], you and your health needs are our priority.  As part of our continuing mission to provide you with exceptional heart care, we have created designated Provider Care Teams.  These Care Teams include your primary Cardiologist (physician) and Advanced Practice Providers (APPs -  Physician Assistants and Nurse Practitioners) who all work together to provide you with the care you need, when you need it.  We recommend signing up for the patient portal called "MyChart".  Sign up information is provided on this After Visit Summary.  MyChart is used to connect with patients for Virtual Visits (Telemedicine).  Patients are able to view lab/test results, encounter notes, upcoming appointments, etc.  Non-urgent messages can be sent to your provider as well.   To learn more about what you can do  with MyChart, go to [email or web address].    Your next appointment:   12 month(s)  Provider:   Dr. Riley Lam    Other Instructions

## 2023-08-28 LAB — COMPREHENSIVE METABOLIC PANEL
ALT: 16 [IU]/L (ref 0–44)
AST: 22 [IU]/L (ref 0–40)
Albumin: 4.5 g/dL (ref 3.9–4.9)
Alkaline Phosphatase: 90 [IU]/L (ref 44–121)
BUN/Creatinine Ratio: 19 (ref 10–24)
BUN: 15 mg/dL (ref 8–27)
Bilirubin Total: 0.3 mg/dL (ref 0.0–1.2)
CO2: 20 mmol/L (ref 20–29)
Calcium: 9.4 mg/dL (ref 8.6–10.2)
Chloride: 107 mmol/L — ABNORMAL HIGH (ref 96–106)
Creatinine, Ser: 0.79 mg/dL (ref 0.76–1.27)
Globulin, Total: 1.9 g/dL (ref 1.5–4.5)
Glucose: 113 mg/dL — ABNORMAL HIGH (ref 70–99)
Potassium: 4.2 mmol/L (ref 3.5–5.2)
Sodium: 144 mmol/L (ref 134–144)
Total Protein: 6.4 g/dL (ref 6.0–8.5)
eGFR: 99 mL/min/{1.73_m2} (ref >59)

## 2023-08-28 LAB — LIPID PANEL
Chol/HDL Ratio: 3.8 {ratio} (ref 0.0–5.0)
Cholesterol, Total: 169 mg/dL (ref 100–199)
HDL: 45 mg/dL (ref >39)
LDL Chol Calc (NIH): 88 mg/dL (ref 0–99)
Triglycerides: 214 mg/dL — ABNORMAL HIGH (ref 0–149)
VLDL Cholesterol Cal: 36 mg/dL (ref 5–40)

## 2023-08-28 LAB — CBC
Hematocrit: 45.6 % (ref 37.5–51.0)
Hemoglobin: 15.3 g/dL (ref 13.0–17.7)
MCH: 30.8 pg (ref 26.6–33.0)
MCHC: 33.6 g/dL (ref 31.5–35.7)
MCV: 92 fL (ref 79–97)
Platelets: 271 10*3/uL (ref 150–450)
RBC: 4.97 x10E6/uL (ref 4.14–5.80)
RDW: 13.1 % (ref 11.6–15.4)
WBC: 13.8 10*3/uL — ABNORMAL HIGH (ref 3.4–10.8)

## 2023-09-02 ENCOUNTER — Ambulatory Visit (INDEPENDENT_AMBULATORY_CARE_PROVIDER_SITE_OTHER): Payer: Medicare HMO

## 2023-09-02 ENCOUNTER — Other Ambulatory Visit (HOSPITAL_COMMUNITY): Payer: Self-pay | Admitting: Physician Assistant

## 2023-09-02 ENCOUNTER — Inpatient Hospital Stay (HOSPITAL_BASED_OUTPATIENT_CLINIC_OR_DEPARTMENT_OTHER)
Admission: RE | Admit: 2023-09-02 | Discharge: 2023-09-02 | Discharge status: Home / Self Care | Payer: Medicare HMO | Source: Ambulatory Visit | Attending: Cardiology | Admitting: Cardiology

## 2023-09-02 ENCOUNTER — Ambulatory Visit
Admission: RE | Admit: 2023-09-02 | Discharge: 2023-09-02 | Disposition: A | Discharge status: Home / Self Care | Payer: Medicare HMO | Source: Ambulatory Visit | Attending: Physician Assistant | Admitting: Physician Assistant

## 2023-09-02 ENCOUNTER — Ambulatory Visit: Payer: Medicare HMO

## 2023-09-02 DIAGNOSIS — Z0100 Encounter for examination of eyes and vision without abnormal findings: Secondary | ICD-10-CM | POA: Diagnosis not present

## 2023-09-02 DIAGNOSIS — Z5181 Encounter for therapeutic drug level monitoring: Secondary | ICD-10-CM | POA: Insufficient documentation

## 2023-09-02 DIAGNOSIS — I739 Peripheral vascular disease, unspecified: Secondary | ICD-10-CM | POA: Diagnosis not present

## 2023-09-02 DIAGNOSIS — H5203 Hypermetropia, bilateral: Secondary | ICD-10-CM | POA: Diagnosis not present

## 2023-09-02 DIAGNOSIS — I480 Paroxysmal atrial fibrillation: Secondary | ICD-10-CM | POA: Insufficient documentation

## 2023-09-02 LAB — VAS US ABI WITH/WO TBI
Left ABI: 1.09
Right ABI: 0.95

## 2023-09-02 LAB — POCT INR: INR: 2.6 (ref 2.0–3.0)

## 2023-09-02 NOTE — Patient Instructions (Signed)
Continue taking warfarin 1 tablet daily except for 1/2 tablet on Tuesdays, Thursdays, and Saturdays.   Recheck INR in 7 weeks.   Main # [phone] Coumadin Clinic# [phone] * ONLY TAKE TYLENOL AND/OR IBUPROFEN AS NEEDED - TRY NOT TO TAKE EVERY DAY

## 2023-09-05 ENCOUNTER — Telehealth: Payer: Self-pay

## 2023-09-05 DIAGNOSIS — E785 Hyperlipidemia, unspecified: Secondary | ICD-10-CM

## 2023-09-05 MED ORDER — EZETIMIBE 10 MG PO TABS
10.0000 mg | ORAL_TABLET | Freq: Every day | ORAL | 3 refills | Status: DC
Start: 2023-09-05 — End: 2024-08-14

## 2023-09-05 NOTE — Telephone Encounter (Signed)
-----   Message from Jacolyn Reedy sent at 08/28/2023  8:12 AM EDT ----- WBC up but similar to past several years.trig up 214 and LDL 88 above goal of less than 70. Add zetia 10 mg once daily. Decrease simple sugars and sweets in diet. Repeat FLP in 3 months

## 2023-09-05 NOTE — Telephone Encounter (Signed)
The patient has been notified of the result and verbalized understanding.  All questions (if any) were answered. Frutoso Schatz, RN 09/05/2023 1:09 PM   Prescription has been sent in. Labs have been ordered.

## 2023-09-30 ENCOUNTER — Encounter (HOSPITAL_COMMUNITY): Payer: Medicare HMO

## 2023-09-30 ENCOUNTER — Ambulatory Visit: Payer: Medicare HMO | Admitting: Surgery

## 2023-09-30 ENCOUNTER — Encounter: Payer: Self-pay | Admitting: Surgery

## 2023-09-30 VITALS — BP 126/85 | HR 68 | Temp 98.6°F | Resp 20 | Ht 68.0 in | Wt 176.0 lb

## 2023-09-30 DIAGNOSIS — I6523 Occlusion and stenosis of bilateral carotid arteries: Secondary | ICD-10-CM

## 2023-09-30 DIAGNOSIS — I70213 Atherosclerosis of native arteries of extremities with intermittent claudication, bilateral legs: Secondary | ICD-10-CM | POA: Diagnosis not present

## 2023-09-30 NOTE — Progress Notes (Signed)
[facility] [place]  Patient name: ABDALRAHMAN MANFORD MRN: [id number] DOB: [birth year] Sex: male   REQUESTING PROVIDER:    Jacolyn Reedy   REASON FOR CONSULT:    PAD  HISTORY OF PRESENT ILLNESS:   KADRIEN VANORDEN is a 67 y.o. male, who is here today for evaluation of peripheral vascular disease particular in his right leg.  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  Previously he can go forever.  He also complains that he cannot walk around his block.  He is riding his bike a longer distance.  He complains of pain at night in his right thigh.  I have seen the patient in the past for carotid disease as well as right subclavian steal.  These issues appear to be stable.  The patient does have a history of coronary artery disease, status post PCI.  He is medically managed for hypertension.  He continues to smoke.  He is on a statin for hypercholesterolemia.  He takes an ACE inhibitor for hypertension.  PAST MEDICAL HISTORY    Past Medical History:  Diagnosis Date   Carotid artery occlusion    Right Carotid Bruit   Chest pain 2003   neg evaluation   Coronary artery disease    a. 12/10/16 PCI with DES--> RCA, normal EF   History of kidney stones    HTN (hypertension)    Hyperlipemia    Leg pain    a. ABIs (12/10):  Normal   Paroxysmal atrial fibrillation (HCC)    Subclavian steal syndrome Aug. 2015   Occult Right     FAMILY HISTORY   Family History  Problem Relation Age of Onset   Heart disease Father        After age 72   Heart attack Father    Cancer Brother    Stroke Brother     SOCIAL HISTORY:   Social History   Socioeconomic History   Marital status: Divorced    Spouse name: Not on file   Number of children: Not on file   Years of education: Not on file   Highest education level: Not on file   Occupational History   Occupation: Investment banker, corporate: [employer]    Comment: [place] [employer]  Tobacco Use   Smoking status: Some Days    Current packs/day: 0.25    Average packs/day: 0.3 packs/day for 27.0 years (6.8 ttl pk-yrs)    Types: Cigarettes   Smokeless tobacco: Never  Vaping Use   Vaping status: Never Used  Substance and Sexual Activity   Alcohol use: Yes    Comment: 12/10/2016 "if I have a beer once/month that's alot"   Drug use: No   Sexual activity: Not Currently  Other Topics Concern   Not on file  Social History Narrative   Not on file   Social Determinants of Health   Financial Resource Strain: Medium Risk (06/15/2022)   Overall Financial Resource Strain (CARDIA)    Difficulty of Paying Living Expenses: Somewhat hard  Food Insecurity: No Food Insecurity (06/15/2022)   Hunger Vital Sign    Worried About Running Out of Food in the Last Year: Never true    Ran Out of Food in the Last Year: Never true  Transportation Needs: No Transportation Needs (06/15/2022)   PRAPARE - Transportation    Lack  of Transportation (Medical): No    Lack of Transportation (Non-Medical): No  Physical Activity: Not on file  Stress: Not on file  Social Connections: Not on file  Intimate Partner Violence: Not on file    ALLERGIES:    Allergies  Allergen Reactions   Tetanus Toxoid Other (See Comments)    Convulsions and fever    CURRENT MEDICATIONS:    Current Outpatient Medications  Medication Sig Dispense Refill   atorvastatin (LIPITOR) 40 MG tablet Take 1 tablet (40 mg total) by mouth daily. 90 tablet 3   clopidogrel (PLAVIX) 75 MG tablet Take 1 tablet (75 mg total) by mouth daily. 90 tablet 3   ezetimibe (ZETIA) 10 MG tablet Take 1 tablet (10 mg total) by mouth daily. 90 tablet 3   lisinopril (ZESTRIL) 10 MG tablet Take 1 tablet (10 mg total) by mouth daily. 90 tablet 3   metoprolol tartrate (LOPRESSOR) 25 MG tablet Take 1 tablet (25 mg total) by mouth 2 (two) times daily.  180 tablet 3   Nicotine 21-14-7 MG/24HR KIT Apply 21mg  patch one daily for 6 weeks (dispense #42), then change to 14mg  patch one daily for 2 weeks (dispense #14), then change to 7mg  patch one daily for 2 weeks (dispense #14). Remove old patch before applying new one. 1 kit 0   nitroGLYCERIN (NITROSTAT) 0.4 MG SL tablet Place 1 tablet (0.4 mg total) under the tongue every 5 (five) minutes as needed for chest pain. 25 tablet 3   omega-3 fish oil (MAXEPA) 1000 MG CAPS capsule Take 1 capsule (1,000 mg total) by mouth 2 (two) times daily. 180 capsule 3   warfarin (COUMADIN) 5 MG tablet TAKE 1/2 TO 1 (ONE-HALF TO ONE) TABLET BY MOUTH ONCE DAILY AS DIRECTED BY COUMADIN CLINIC 100 tablet 0   No current facility-administered medications for this visit.    REVIEW OF SYSTEMS:   [X]  denotes positive finding, [ ]  denotes negative finding Cardiac  Comments:  Chest pain or chest pressure:    Shortness of breath upon exertion:    Short of breath when lying flat:    Irregular heart rhythm:        Vascular    Pain in calf, thigh, or hip brought on by ambulation: x   Pain in feet at night that wakes you up from your sleep:     Blood clot in your veins:    Leg swelling:         Pulmonary    Oxygen at home:    Productive cough:     Wheezing:         Neurologic    Sudden weakness in arms or legs:     Sudden numbness in arms or legs:     Sudden onset of difficulty speaking or slurred speech:    Temporary loss of vision in one eye:     Problems with dizziness:         Gastrointestinal    Blood in stool:      Vomited blood:         Genitourinary    Burning when urinating:     Blood in urine:        Psychiatric    Major depression:         Hematologic    Bleeding problems:    Problems with blood clotting too easily:        Skin    Rashes or ulcers:        Constitutional  Fever or chills:     PHYSICAL EXAM:   Vitals:   09/30/23 1019  BP: 126/85  Pulse: 68  Resp: 20  Temp: 98.6  F (37 C)  SpO2: 97%  Weight: 176 lb (79.8 kg)  Height: 5\' 8"  (1.727 m)    GENERAL: The patient is a well-nourished male, in no acute distress. The vital signs are documented above. CARDIAC: There is a regular rate and rhythm.  VASCULAR: Palpable pedal pulses PULMONARY: Nonlabored respirations MUSCULOSKELETAL: There are no major deformities or cyanosis. NEUROLOGIC: No focal weakness or paresthesias are detected. SKIN: There are no ulcers or rashes noted. PSYCHIATRIC: The patient has a normal affect.  STUDIES:   I have reviewed the following: +-------+-----------+-----------+------------+------------+  ABI/TBIToday's ABIToday's TBIPrevious ABIPrevious TBI  +-------+-----------+-----------+------------+------------+  Right .95        .74                                  +-------+-----------+-----------+------------+------------+  Left  1.09       1.03                                 +-------+-----------+-----------+------------+------------+  Right toe pressure: 87 Left toe pressure: 120 Waveforms of both legs are multiphasic and triphasic  Lower extremity: Right: Atherosclerosis throughout.  75-99% stenosis in the mid SFA, based on VR 5.4, low end range.  Three vessel run-off.   Left: Mild atherosclerosis throughout without evidence of significant  obstruction in the CFA, SFA, popliteal artery and TPT.  Three vessel run-off.    ASSESSMENT and PLAN   PAD: The patient has palpable pedal pulses at rest and normal ABIs at rest.  Ultrasound does suggest right SFA stenosis.  His symptoms are not completely consistent with claudication.  I told him that the pain he has at night is not related to poor circulation.  The difficulty he has getting around with cramping in his thigh and calf is related to his atherosclerotic vascular disease.  I do not feel his symptoms are severe enough to warrant intervention.  In addition, he continues to smoke.  Therefore, would not  recommend treating this.  I stressed the importance of an exercise program and smoking cessation.  He will follow-up in 1 year with ABIs and carotids.   Charlena Cross, MD, FACS [facility] [phone] Pager [phone]

## 2023-10-07 ENCOUNTER — Other Ambulatory Visit: Payer: Self-pay

## 2023-10-07 DIAGNOSIS — I70213 Atherosclerosis of native arteries of extremities with intermittent claudication, bilateral legs: Secondary | ICD-10-CM

## 2023-10-07 DIAGNOSIS — I6523 Occlusion and stenosis of bilateral carotid arteries: Secondary | ICD-10-CM

## 2023-10-09 DIAGNOSIS — L03116 Cellulitis of left lower limb: Secondary | ICD-10-CM | POA: Diagnosis not present

## 2023-10-21 ENCOUNTER — Ambulatory Visit: Payer: Medicare HMO | Attending: Cardiovascular Disease

## 2023-10-21 DIAGNOSIS — Z5181 Encounter for therapeutic drug level monitoring: Secondary | ICD-10-CM

## 2023-10-21 DIAGNOSIS — I480 Paroxysmal atrial fibrillation: Secondary | ICD-10-CM

## 2023-10-21 LAB — POCT INR: INR: 2.2 (ref 2.0–3.0)

## 2023-10-21 NOTE — Patient Instructions (Signed)
Continue taking warfarin 1 tablet daily except for 1/2 tablet on Tuesdays, Thursdays, and Saturdays.   Recheck INR in 7 weeks.   Main # [phone] Coumadin Clinic# [phone] * ONLY TAKE TYLENOL AND/OR IBUPROFEN AS NEEDED - TRY NOT TO TAKE EVERY DAY

## 2023-11-19 ENCOUNTER — Telehealth: Payer: Self-pay | Admitting: Internal Medicine

## 2023-11-19 NOTE — Telephone Encounter (Signed)
 Pt c/o medication issue:  1. Name of Medication: warfarin (COUMADIN ) 5 MG tablet   2. How are you currently taking this medication (dosage and times per day)? As prescribed   3. Are you having a reaction (difficulty breathing--STAT)? Yes   4. What is your medication issue? Patient states that he has been getting bad nose bleeds the last two days and would like to speak to someone in regards to this. Please advise.

## 2023-11-19 NOTE — Telephone Encounter (Signed)
 Called and spoke to patient. Verified name and DOB. Patient called to report his nose has been bleeding for about 6 hours and he has went through a whole role of tissue. He stated this is the second time he's had a nose bleed that has lasted a long time. Advised patient to go to the ED for evaluation. Patient did ask if he just cut his Coumadin  dose. Again patient was advised to go to the ED. Patient verbalized understanding and agree.

## 2023-12-09 ENCOUNTER — Ambulatory Visit: Payer: Medicare HMO

## 2023-12-16 ENCOUNTER — Ambulatory Visit: Payer: No Typology Code available for payment source | Attending: Internal Medicine

## 2023-12-16 DIAGNOSIS — I480 Paroxysmal atrial fibrillation: Secondary | ICD-10-CM | POA: Diagnosis not present

## 2023-12-16 DIAGNOSIS — Z5181 Encounter for therapeutic drug level monitoring: Secondary | ICD-10-CM

## 2023-12-16 LAB — POCT INR: INR: 3.1 — AB (ref 2.0–3.0)

## 2023-12-16 NOTE — Patient Instructions (Signed)
Continue taking warfarin 1 tablet daily except for 1/2 tablet on Tuesdays, Thursdays, and Saturdays.  Eat greens tonight Recheck INR in 7 weeks.   Main # [phone] Coumadin Clinic# [phone] * ONLY TAKE TYLENOL AND/OR IBUPROFEN AS NEEDED - TRY NOT TO TAKE EVERY DAY

## 2024-02-03 ENCOUNTER — Ambulatory Visit: Payer: No Typology Code available for payment source | Attending: Internal Medicine | Admitting: *Deleted

## 2024-02-03 DIAGNOSIS — I480 Paroxysmal atrial fibrillation: Secondary | ICD-10-CM | POA: Diagnosis not present

## 2024-02-03 DIAGNOSIS — Z5181 Encounter for therapeutic drug level monitoring: Secondary | ICD-10-CM

## 2024-02-03 LAB — POCT INR: POC INR: 3.6

## 2024-02-03 NOTE — Patient Instructions (Signed)
 Description   Hold warfarin today Then START taking warfarin 1/2 a tablet daily except for 1 tablet on Mondays, Wednesdays and Fridays. Recheck INR in 2 weeks.  Main # [phone] Coumadin Clinic# [phone]

## 2024-02-17 ENCOUNTER — Ambulatory Visit: Attending: Internal Medicine

## 2024-02-17 DIAGNOSIS — I480 Paroxysmal atrial fibrillation: Secondary | ICD-10-CM | POA: Diagnosis not present

## 2024-02-17 DIAGNOSIS — Z5181 Encounter for therapeutic drug level monitoring: Secondary | ICD-10-CM

## 2024-02-17 LAB — POCT INR: INR: 3.4 — AB (ref 2.0–3.0)

## 2024-02-17 NOTE — Patient Instructions (Signed)
 Description   Hold warfarin today's dose  Then START taking warfarin 1/2 a tablet daily except for 1 tablet on Mondays, Wednesdays, and Friday Recheck INR in 2 weeks.  Main # [phone] Coumadin Clinic# [phone]

## 2024-02-25 ENCOUNTER — Other Ambulatory Visit: Payer: Self-pay | Admitting: Interventional Cardiology

## 2024-02-25 DIAGNOSIS — I480 Paroxysmal atrial fibrillation: Secondary | ICD-10-CM

## 2024-02-25 NOTE — Telephone Encounter (Signed)
 Refill request for warfarin:  Last INR was 3.4 on 02/17/24 Next INR due 03/02/24 LOV was 08/27/23  Refill approved.

## 2024-03-02 ENCOUNTER — Ambulatory Visit: Attending: Internal Medicine

## 2024-03-02 DIAGNOSIS — I480 Paroxysmal atrial fibrillation: Secondary | ICD-10-CM | POA: Diagnosis not present

## 2024-03-02 DIAGNOSIS — Z5181 Encounter for therapeutic drug level monitoring: Secondary | ICD-10-CM

## 2024-03-02 LAB — POCT INR: INR: 2.4 (ref 2.0–3.0)

## 2024-03-02 NOTE — Patient Instructions (Signed)
 Continue taking warfarin 1/2 a tablet daily except for 1 tablet on Mondays, Wednesdays, and Friday Recheck INR in 4 weeks.  Main # [phone] Coumadin  Clinic# [phone]

## 2024-03-30 ENCOUNTER — Ambulatory Visit

## 2024-04-13 ENCOUNTER — Ambulatory Visit: Attending: Internal Medicine

## 2024-04-13 DIAGNOSIS — I480 Paroxysmal atrial fibrillation: Secondary | ICD-10-CM

## 2024-04-13 DIAGNOSIS — Z5181 Encounter for therapeutic drug level monitoring: Secondary | ICD-10-CM

## 2024-04-13 LAB — POCT INR: INR: 3 (ref 2.0–3.0)

## 2024-04-13 NOTE — Patient Instructions (Signed)
 Continue taking warfarin 1/2 a tablet daily except for 1 tablet on Mondays, Wednesdays, and Friday Recheck INR in 6 weeks.  Main # [phone] Coumadin  Clinic# [phone]

## 2024-05-12 ENCOUNTER — Other Ambulatory Visit: Payer: Self-pay

## 2024-05-12 ENCOUNTER — Emergency Department (HOSPITAL_BASED_OUTPATIENT_CLINIC_OR_DEPARTMENT_OTHER)

## 2024-05-12 ENCOUNTER — Emergency Department (HOSPITAL_BASED_OUTPATIENT_CLINIC_OR_DEPARTMENT_OTHER)
Admission: EM | Admit: 2024-05-12 | Discharge: 2024-05-12 | Disposition: A | Discharge status: Home / Self Care | Attending: Emergency Medicine | Admitting: Emergency Medicine

## 2024-05-12 ENCOUNTER — Telehealth: Payer: Self-pay | Admitting: Internal Medicine

## 2024-05-12 ENCOUNTER — Encounter (HOSPITAL_BASED_OUTPATIENT_CLINIC_OR_DEPARTMENT_OTHER): Payer: Self-pay

## 2024-05-12 DIAGNOSIS — Z5329 Procedure and treatment not carried out because of patient's decision for other reasons: Secondary | ICD-10-CM | POA: Insufficient documentation

## 2024-05-12 DIAGNOSIS — S80811A Abrasion, right lower leg, initial encounter: Secondary | ICD-10-CM | POA: Diagnosis not present

## 2024-05-12 DIAGNOSIS — Z7902 Long term (current) use of antithrombotics/antiplatelets: Secondary | ICD-10-CM | POA: Insufficient documentation

## 2024-05-12 DIAGNOSIS — R791 Abnormal coagulation profile: Secondary | ICD-10-CM

## 2024-05-12 DIAGNOSIS — Z79899 Other long term (current) drug therapy: Secondary | ICD-10-CM | POA: Insufficient documentation

## 2024-05-12 DIAGNOSIS — X58XXXA Exposure to other specified factors, initial encounter: Secondary | ICD-10-CM | POA: Diagnosis not present

## 2024-05-12 DIAGNOSIS — D649 Anemia, unspecified: Secondary | ICD-10-CM | POA: Insufficient documentation

## 2024-05-12 DIAGNOSIS — Z7901 Long term (current) use of anticoagulants: Secondary | ICD-10-CM | POA: Diagnosis not present

## 2024-05-12 DIAGNOSIS — I2510 Atherosclerotic heart disease of native coronary artery without angina pectoris: Secondary | ICD-10-CM | POA: Diagnosis not present

## 2024-05-12 DIAGNOSIS — D72829 Elevated white blood cell count, unspecified: Secondary | ICD-10-CM | POA: Insufficient documentation

## 2024-05-12 DIAGNOSIS — R319 Hematuria, unspecified: Secondary | ICD-10-CM | POA: Diagnosis present

## 2024-05-12 DIAGNOSIS — I10 Essential (primary) hypertension: Secondary | ICD-10-CM | POA: Insufficient documentation

## 2024-05-12 LAB — URINALYSIS, ROUTINE W REFLEX MICROSCOPIC
Glucose, UA: NEGATIVE mg/dL
Ketones, ur: NEGATIVE mg/dL
Leukocytes,Ua: NEGATIVE
Nitrite: NEGATIVE
Protein, ur: 100 mg/dL — AB
Specific Gravity, Urine: >=1.03 (ref 1.005–1.030)
pH: 5.5 (ref 5.0–8.0)

## 2024-05-12 LAB — COMPREHENSIVE METABOLIC PANEL WITH GFR
ALT: 10 U/L (ref 0–44)
AST: 31 U/L (ref 15–41)
Albumin: 4.1 g/dL (ref 3.5–5.0)
Alkaline Phosphatase: 77 U/L (ref 38–126)
Anion gap: 11 (ref 5–15)
BUN: 23 mg/dL (ref 8–23)
CO2: 21 mmol/L — ABNORMAL LOW (ref 22–32)
Calcium: 9.8 mg/dL (ref 8.9–10.3)
Chloride: 110 mmol/L (ref 98–111)
Creatinine, Ser: 0.84 mg/dL (ref 0.61–1.24)
GFR, Estimated: >60 mL/min (ref >60)
Glucose, Bld: 124 mg/dL — ABNORMAL HIGH (ref 70–99)
Potassium: 3.7 mmol/L (ref 3.5–5.1)
Sodium: 142 mmol/L (ref 135–145)
Total Bilirubin: 0.3 mg/dL (ref 0.0–1.2)
Total Protein: 6.4 g/dL — ABNORMAL LOW (ref 6.5–8.1)

## 2024-05-12 LAB — CBC
HCT: 35.9 % — ABNORMAL LOW (ref 39.0–52.0)
Hemoglobin: 12.6 g/dL — ABNORMAL LOW (ref 13.0–17.0)
MCH: 29.6 pg (ref 26.0–34.0)
MCHC: 35.1 g/dL (ref 30.0–36.0)
MCV: 84.5 fL (ref 80.0–100.0)
Platelets: 231 10*3/uL (ref 150–400)
RBC: 4.25 MIL/uL (ref 4.22–5.81)
RDW: 13.7 % (ref 11.5–15.5)
WBC: 12 10*3/uL — ABNORMAL HIGH (ref 4.0–10.5)
nRBC: 0 % (ref 0.0–0.2)

## 2024-05-12 LAB — I-STAT CHEM 8, ED
BUN: 23 mg/dL (ref 8–23)
Calcium, Ion: 1.22 mmol/L (ref 1.15–1.40)
Chloride: 110 mmol/L (ref 98–111)
Creatinine, Ser: 0.8 mg/dL (ref 0.61–1.24)
Glucose, Bld: 106 mg/dL — ABNORMAL HIGH (ref 70–99)
HCT: 34 % — ABNORMAL LOW (ref 39.0–52.0)
Hemoglobin: 11.6 g/dL — ABNORMAL LOW (ref 13.0–17.0)
Potassium: 3.3 mmol/L — ABNORMAL LOW (ref 3.5–5.1)
Sodium: 144 mmol/L (ref 135–145)
TCO2: 20 mmol/L — ABNORMAL LOW (ref 22–32)

## 2024-05-12 LAB — URINALYSIS, MICROSCOPIC (REFLEX)
RBC / HPF: >50 RBC/hpf (ref 0–5)
Squamous Epithelial / HPF: NONE SEEN /HPF (ref 0–5)

## 2024-05-12 LAB — PROTIME-INR
INR: 8.9 (ref 0.8–1.2)
Prothrombin Time: 75.8 s — ABNORMAL HIGH (ref 11.4–15.2)

## 2024-05-12 MED ORDER — VITAMIN K1 1 MG/0.5ML IJ SOLN
INTRAMUSCULAR | Status: AC
  Filled 2024-05-12: qty 0.5

## 2024-05-12 MED ORDER — VITAMIN K1 10 MG/ML IJ SOLN
1.0000 mg | Freq: Once | INTRAVENOUS | Status: AC
  Administered 2024-05-12: 1 mg via INTRAVENOUS
  Filled 2024-05-12: qty 0.1

## 2024-05-12 NOTE — Telephone Encounter (Signed)
 Spoke w patient.  First noticed blood yesterday.  Today his urine is a bit red.  This am he could not see the bottom of the toilet after he urinated but this afternoon he can.    Has 2 scratches from his cat that have been bleeding for days.  I adv to go to the urgent care at this time.  He pulled a muscle in his neck and used a lidocaine  patch and took ibuprofen 600 mg at least 2x per day.  He has no PCP.  I asked him to go into the ER or an urgent care today.  He is in [place] currently and [facility].

## 2024-05-12 NOTE — ED Provider Notes (Signed)
 [place] [facility] Provider Note   CSN: [id number] Arrival date & time: 05/12/24  1549     Patient presents with: Hematuria   [name] is a 68 y.o. male.    Hematuria   68 year old male presents emergency department with complaints of hematuria.  Noticed blood in his urine this morning that turned the probable water red.  States it is cleared.  States he has 2 cat scratches on his right leg that he had difficulty with healing over the past week to week and a half or so.  Also noticed blood in his semen on Saturday.  States that he is on Coumadin  secondary to atrial fibrillation.  Has been dealing with some back pain/neck pain that he developed after sleeping abnormally with ibuprofen and Tylenol  for the past 10 days to 2 weeks.  Patient is also on Plavix  due to known CAD with stent placement.  Denies any fevers, chills, abdominal or flank pain, chest pain or shortness of breath, gingival bleeding, nosebleeding.  Past medical history significant for hyperlipidemia, hypertension, paroxysmal atrial fibrillation on Coumadin , CAD, PVD with claudication  Prior to Admission medications   Medication Sig Start Date End Date Taking? Authorizing Provider  atorvastatin  (LIPITOR) 40 MG tablet Take 1 tablet (40 mg total) by mouth daily. 08/27/23   Parthenia Olivia HERO, PA-C  clopidogrel  (PLAVIX ) 75 MG tablet Take 1 tablet (75 mg total) by mouth daily. 08/27/23   Parthenia Olivia HERO, PA-C  ezetimibe  (ZETIA ) 10 MG tablet Take 1 tablet (10 mg total) by mouth daily. 09/05/23 12/04/23  Parthenia Olivia HERO, PA-C  lisinopril  (ZESTRIL ) 10 MG tablet Take 1 tablet (10 mg total) by mouth daily. 08/27/23   Parthenia Olivia HERO, PA-C  metoprolol  tartrate (LOPRESSOR ) 25 MG tablet Take 1 tablet (25 mg total) by mouth 2 (two) times daily. 08/27/23   Parthenia Olivia HERO, PA-C  Nicotine  21-14-7 MG/24HR KIT Apply 21mg  patch one daily for 6 weeks (dispense #42), then change to 14mg  patch one  daily for 2 weeks (dispense #14), then change to 7mg  patch one daily for 2 weeks (dispense #14). Remove old patch before applying new one. 08/27/23   Parthenia Olivia HERO, PA-C  nitroGLYCERIN  (NITROSTAT ) 0.4 MG SL tablet Place 1 tablet (0.4 mg total) under the tongue every 5 (five) minutes as needed for chest pain. 08/27/23   Parthenia Olivia HERO, PA-C  omega-3 fish oil  (MAXEPA) 1000 MG CAPS capsule Take 1 capsule (1,000 mg total) by mouth 2 (two) times daily. 05/06/20   Parthenia Olivia HERO, PA-C  warfarin (COUMADIN ) 5 MG tablet TAKE 1/2 TO 1 (ONE-HALF TO ONE) TABLET BY MOUTH ONCE DAILY AS DIRECTED BY COUMADIN  CLINIC 02/25/24   Santo Stanly LABOR, MD    Allergies: Tetanus toxoid    Review of Systems  Genitourinary:  Positive for hematuria.  All other systems reviewed and are negative.   Updated Vital Signs Ht 5' 8 (1.727 m)   Wt 74.8 kg   BMI 25.09 kg/m   Physical Exam Vitals and nursing note reviewed.  Constitutional:      General: He is not in acute distress.    Appearance: He is well-developed.  HENT:     Head: Normocephalic and atraumatic.   Eyes:     Conjunctiva/sclera: Conjunctivae normal.    Cardiovascular:     Rate and Rhythm: Normal rate and regular rhythm.     Heart sounds: No murmur heard. Pulmonary:     Effort: Pulmonary effort is  normal. No respiratory distress.     Breath sounds: Normal breath sounds. No wheezing, rhonchi or rales.  Abdominal:     Palpations: Abdomen is soft.     Tenderness: There is no abdominal tenderness. There is no guarding.   Musculoskeletal:        General: No swelling.     Cervical back: Neck supple.   Skin:    General: Skin is warm and dry.     Capillary Refill: Capillary refill takes less than 2 seconds.     Findings: No rash.     Comments: Superficial abrasions appreciated right medial lower leg scabbed over.  Additional areas of superficial abrasions right popliteal fossa; no active bleeding.   Neurological:     Mental Status: He  is alert.   Psychiatric:        Mood and Affect: Mood normal.    (all labs ordered are listed, but only abnormal results are displayed) Labs Reviewed  COMPREHENSIVE METABOLIC PANEL WITH GFR  PROTIME-INR  CBC  URINALYSIS, ROUTINE W REFLEX MICROSCOPIC    EKG: None  Radiology: No results found.   Procedures   Medications Ordered in the ED - No data to display                                  Medical Decision Making Amount and/or Complexity of Data Reviewed Labs: ordered. Radiology: ordered.   This patient presents to the ED for concern of hematuria, this involves an extensive number of treatment options, and is a complaint that carries with it a high risk of complications and morbidity.  The differential diagnosis includes nephrolithiasis, UTI, cancer, coagulopathy,   Co morbidities that complicate the patient evaluation  See HPI   Additional history obtained:  Additional history obtained from EMR External records from outside source obtained and reviewed including hospital records   Lab Tests:  I Ordered, and personally interpreted labs.  The pertinent results include: Leukocytosis of 12.  Anemia hemoglobin of 12.6 of which is normocytic.  Platelets within normal range.  On decrease in bicarb 21 otherwise, chest within normal limits.  No transaminitis.  UA brown, turbid with.  And 50 RBCs, few bacteria negative nitrate, Gosai 10 WBCs.  PT/INR 75.8, 8.9 respectively.   Imaging Studies ordered:  I ordered imaging studies including CT renal stone study I independently visualized and interpreted imaging which showed nonobstructing 8mm right renal calculus.  Enlargement of prostate.  Likely punctate cholesterol gallstones.  Distal colonic diverticulosis.  Aortic atherosclerosis. I agree with the radiologist interpretation  Cardiac Monitoring: / EKG:  The patient was maintained on a cardiac monitor.  I personally viewed and interpreted the cardiac monitored which  showed an underlying rhythm of: Sinus rhythm   Consultations Obtained:  I requested consultation with attending Dr. Yolande    Problem List / ED Course / Critical interventions / Medication management  Hematuria, elevated INR, anemia I ordered medication including vitamin K   Reevaluation of the patient after these medicines showed that the patient improved I have reviewed the patients home medicines and have made adjustments as needed   Social Determinants of Health:  Cigarette use.  Denies illicit drug use.   Test / Admission - Considered:  Hematuria, elevated INR, anemia Vitals signs within normal range and stable throughout visit. Laboratory/imaging studies significant for: See above 68 year old male presents emergency department with complaints of hematuria.  Noticed blood in his urine this  morning that turned the probable water red.  States it is cleared.  States he has 2 cat scratches on his right leg that he had difficulty with healing over the past week to week and a half or so.  Also noticed blood in his semen on Saturday.  States that he is on Coumadin  secondary to atrial fibrillation.  Has been dealing with some back pain/neck pain that he developed after sleeping abnormally with ibuprofen and Tylenol  for the past 10 days to 2 weeks.  Patient is also on Plavix  due to known CAD with stent placement.  Denies any fevers, chills, abdominal or flank pain, chest pain or shortness of breath, gingival bleeding, nosebleeding. On exam, no abdominal or flank tenderness.  Lungs clear to auscultation bilaterally.  Workup today concerning for significantly elevated INR of 8.9.  Patient also with noticeable anemia hemoglobin 12.6 today down from 15.6 around 8 months ago.  UA with gross hematuria brown and turbid in appearance.  CT renal stone study was performed given concern for possible mass versus stone versus other etiology of patient's gross hematuria which was largely reassuring.   Patient does have 2 areas of abrasions on right lower extremity which have no active bleeding currently.  Given patient's significant elevated INR in the setting of gross hematuria and new anemia, admission to the hospital was recommended for optimization of INR.  Long discussion was had with patient and patient concerned about increased financial strains with inability to afford hospital stay and would prefer to get 1 dose of vitamin K  IV and follow-up outpatient with cardiology for further management of INR.  I discussed at length with patient regarding increased risk of morbidity/potentially mortality of having the elevated INR with bleeding being present and what appears to be worsening anemia from baseline and he acknowledged understanding and still elected to leave AGAINST MEDICAL ADVICE.  Will give 1 dose of vitamin K  and have patient sign AMA and have him hold Coumadin  until further directed by cardiology.        Final diagnoses:  None    ED Discharge Orders     None          Silver Wonda LABOR, [place] 05/12/24 1858    Yolande Lamar BROCKS, MD 05/15/24 [phone]

## 2024-05-12 NOTE — ED Notes (Signed)
 Pt unable to provide urine sample at this time

## 2024-05-12 NOTE — ED Notes (Signed)
 Pt Aox4, verbalized understanding of leaving AMA. Pt signed AMA forms, present nurse present teaching about risk and complications to pt, however, pt decided to leave in his own will. Pt was calm cooperative and respectful

## 2024-05-12 NOTE — Discharge Instructions (Signed)
 As discussed, recommend holding your Coumadin  until further directed by your cardiologist.  Recommend close follow-up with your cardiologist this week for reassessment.  Please do not hesitate to return to the emergency department if the worrisome signs and symptoms we discussed become apparent.

## 2024-05-12 NOTE — Telephone Encounter (Signed)
 Pt has had blood in his urine and semen. Also has had a hard stopping scratches or bug bites from bleeding. Please advise

## 2024-05-12 NOTE — Telephone Encounter (Signed)
 I spoke to the patient who says that he is bleeding a lot lately.  A mosquito bite causes bleeding.  He will go to [facility] to address first, but I did schedule him for INR check at 10:00 7/2, if they do not check INR lab.

## 2024-05-12 NOTE — ED Triage Notes (Signed)
 This morning noticed blood in urine, Saturday had blood in semen. Thinks his warfarin levels are off. Denies abdominal pain. Been taking ibuprofen for neck pain multiple times a day for the past week and a half.

## 2024-05-13 ENCOUNTER — Ambulatory Visit: Attending: Internal Medicine | Admitting: Pharmacist

## 2024-05-13 DIAGNOSIS — I480 Paroxysmal atrial fibrillation: Secondary | ICD-10-CM

## 2024-05-13 DIAGNOSIS — Z5181 Encounter for therapeutic drug level monitoring: Secondary | ICD-10-CM | POA: Diagnosis not present

## 2024-05-13 LAB — POCT INR: INR: 2.9 (ref 2.0–3.0)

## 2024-05-13 NOTE — Patient Instructions (Signed)
 Description   Continue taking warfarin 1/2 a tablet daily except for 1 tablet on Mondays, Wednesdays, and Friday Recheck INR in 1 week Main # [phone] Coumadin  Clinic# [phone]

## 2024-05-13 NOTE — Progress Notes (Signed)
 Please see anti-coag encounter

## 2024-05-14 LAB — URINE CULTURE: Culture: NO GROWTH

## 2024-05-18 ENCOUNTER — Ambulatory Visit: Attending: Internal Medicine | Admitting: *Deleted

## 2024-05-18 DIAGNOSIS — Z5181 Encounter for therapeutic drug level monitoring: Secondary | ICD-10-CM | POA: Diagnosis not present

## 2024-05-18 DIAGNOSIS — I480 Paroxysmal atrial fibrillation: Secondary | ICD-10-CM | POA: Diagnosis not present

## 2024-05-18 LAB — POCT INR: INR: 1.3 — AB (ref 2.0–3.0)

## 2024-05-18 NOTE — Progress Notes (Signed)
Please see anticoagulation encounter.

## 2024-05-18 NOTE — Patient Instructions (Addendum)
 Description   Resume taking your warfarin today and continue taking warfarin 1/2 tablet daily except for 1 tablet on Mondays, Wednesdays, and Friday Recheck INR in 1 week Main # [phone] Coumadin  Clinic# [phone]

## 2024-05-25 ENCOUNTER — Ambulatory Visit

## 2024-05-25 ENCOUNTER — Ambulatory Visit: Attending: Internal Medicine

## 2024-05-25 DIAGNOSIS — I480 Paroxysmal atrial fibrillation: Secondary | ICD-10-CM

## 2024-05-25 DIAGNOSIS — Z5181 Encounter for therapeutic drug level monitoring: Secondary | ICD-10-CM

## 2024-05-25 LAB — POCT INR: INR: 2.4 (ref 2.0–3.0)

## 2024-05-25 NOTE — Patient Instructions (Signed)
 Description   Continue taking warfarin 1/2 tablet daily except for 1 tablet on Mondays, Wednesdays, and Friday Recheck INR in 1 week Main # [phone] Coumadin  Clinic# [phone]

## 2024-05-25 NOTE — Progress Notes (Signed)
Please see anticoagulation encounter.

## 2024-06-01 ENCOUNTER — Ambulatory Visit: Attending: Internal Medicine | Admitting: *Deleted

## 2024-06-01 DIAGNOSIS — I480 Paroxysmal atrial fibrillation: Secondary | ICD-10-CM | POA: Diagnosis not present

## 2024-06-01 DIAGNOSIS — Z5181 Encounter for therapeutic drug level monitoring: Secondary | ICD-10-CM

## 2024-06-01 LAB — POCT INR: INR: 2.8 (ref 2.0–3.0)

## 2024-06-01 NOTE — Patient Instructions (Signed)
 Description   Continue taking warfarin 1/2 tablet daily except for 1 tablet on Mondays, Wednesdays, and Friday Recheck INR in 3 weeks.  Main # [phone] Coumadin  Clinic# [phone]

## 2024-06-01 NOTE — Progress Notes (Signed)
 INR-2.8 Please see anticoagulation encounter

## 2024-06-17 ENCOUNTER — Telehealth: Payer: Self-pay

## 2024-06-17 NOTE — Telephone Encounter (Signed)
   Name: [name]  DOB: 03-26-1956  MRN: [id number]   Primary Cardiologist: Martin DELENA Leavens, MD  Chart reviewed as part of pre-operative protocol coverage. Patient was contacted 06/17/2024 in reference to pre-operative risk assessment for pending surgery as outlined below.   Pharmacy recommendations are to hold coumadin  and for him to also hold Plavix  until after surgery scheduled on 06/19/2024.  He is also told to eat spinach or dark leafy vegetables and have labs drawn again on day of surgery.  This information has been sent to [name], his surgeon. The patient verbalized understanding.  Therefore, based on ACC/AHA guidelines, the patient would be at acceptable risk for the planned procedure without further cardiovascular testing.   The patient was advised that if he develops new symptoms prior to surgery to contact our office to arrange for a follow-up visit, and he verbalized understanding.  I will route this recommendation to the requesting party via Epic fax function and remove from pre-op pool. Please call with questions.  Martin Satterfield, NP 06/17/2024, 4:01 PM

## 2024-06-17 NOTE — Telephone Encounter (Signed)
   Patient Name: [name]  DOB: December 27, 1955 MRN: [id number]  Primary Cardiologist: Stanly DELENA Leavens, MD  Clinical pharmacists have reviewed the patient's past medical history, labs, and current medications as part of preoperative protocol coverage. The following recommendations have been made:  CHA2DS2-VASc Score = 3   This indicates a 3.2% annual risk of stroke. The patient's score is based upon: CHF History: 0 HTN History: 1 Diabetes History: 0 Stroke History: 0 Vascular Disease History: 1 Age Score: 1 Gender Score: 0     CrCl 103 Platelet count 275   Patient has not had an Afib/aflutter ablation within the last 3 months or DCCV within the last 30 days   Per office protocol, patient can hold warfarin for 5 days prior to procedure.   Patient will not need bridging with Lovenox  (enoxaparin ) around procedure.   Because procedure is set for 8/8, will be off warfarin x 2 days.  Patient can eat some extra vitamin K  rich vegetables (spinach, collards, kale) to help lower INR if surgery is to proceed, this might help to lower INR faster.  Would suggest that surgeon check INR prior to procedure, but whether to proceed or not is up to them.    I will route this recommendation to the requesting party via Epic fax function and remove from pre-op pool.  Please call with questions.  Lamarr Satterfield, NP 06/17/2024, 3:54 PM

## 2024-06-17 NOTE — Telephone Encounter (Signed)
   Pre-operative Risk Assessment    Patient Name: [name]  DOB: 1956/01/24 MRN: [id number]   Date of last office visit: 08/27/23 OLIVIA PAVY, PA-C Date of next office visit: NONE   Request for Surgical Clearance    Procedure:  CYSTOSCOPY, URETEROSCOPY WITH LASER LITHOTRIPSY  Date of Surgery:  Clearance 06/19/24                                Surgeon:  DR EVALENE NAM Surgeon's Group or Practice Name:  [facility] Phone number:  [phone] Fax number:  [phone]  /  [phone]   Type of Clearance Requested:   - Medical  - Pharmacy:  Hold Clopidogrel  (Plavix ) and Warfarin (Coumadin ) (PER REQUEST THE FOLLOWING: PLEASE LET DR MULLINS KNOW HOW PATIENT NEEDS TO HANDLE WARFARIN WITHIN THIS 2 DAY TIMEFRAME- LAST DOSE 8/5/ P.M)   Type of Anesthesia:  Not Indicated   Additional requests/questions:    Signed, Lucie DELENA Ku   06/17/2024, 10:52 AM

## 2024-06-17 NOTE — Telephone Encounter (Signed)
 Patient with diagnosis of atrial fibrillation on warfarin for anticoagulation.    Procedure:  CYSTOSCOPY, URETEROSCOPY WITH LASER LITHOTRIPSY   Date of Surgery:  Clearance 06/19/24    CHA2DS2-VASc Score = 3   This indicates a 3.2% annual risk of stroke. The patient's score is based upon: CHF History: 0 HTN History: 1 Diabetes History: 0 Stroke History: 0 Vascular Disease History: 1 Age Score: 1 Gender Score: 0    CrCl 103 Platelet count 275  Patient has not had an Afib/aflutter ablation within the last 3 months or DCCV within the last 30 days  Per office protocol, patient can hold warfarin for 5 days prior to procedure.   Patient will not need bridging with Lovenox  (enoxaparin ) around procedure.  Because procedure is set for 8/8, will be off warfarin x 2 days.  Patient can eat some extra vitamin K  rich vegetables (spinach, collards, kale) to help lower INR if surgery is to proceed, this might help to lower INR faster.  Would suggest that surgeon check INR prior to procedure, but whether to proceed or not is up to them.    **This guidance is not considered finalized until pre-operative APP has relayed final recommendations.**

## 2024-06-17 NOTE — Telephone Encounter (Signed)
 Spoke with Bari from Dr. Steen office and she asked is there any exceptions for this patient to have his surgery on 8/8 due to patient being in so much pain. Bari states that Dr. Steen is the one that called for the urgent surgery. She would also like to know when the time frame of patient holding his Plavix  and Warfarin because patient's last dose of Warfarin was last night, 8/5, thinking patient only had to hold for 48 hours. Please advise.

## 2024-06-17 NOTE — Telephone Encounter (Signed)
 Plavix  would need to be held for 5 days. Pharmacy will need to decided about coumadin .  If surgeon wants to complete procedure on Plavix  he can, but coumadin  timing should come from pharmacy. I will reach out to them.

## 2024-06-17 NOTE — Telephone Encounter (Signed)
 Pt called and states he is scheduled for urgent surgery on Friday, 06/19/24. I made him aware he will need a cardiac clearance completed for this procedure.   I called and spoke with Bari at Dr Steen' office. She states she will fax over a clearance request to our office now.

## 2024-06-17 NOTE — Telephone Encounter (Signed)
 Pharmacy please advise on holding coumadin  prior to  CYSTOSCOPY, URETEROSCOPY WITH LASER LITHOTRIPSY  scheduled for 06/19/2024. Thank you.

## 2024-06-17 NOTE — Telephone Encounter (Signed)
 The request does not allow time for holding coumadin  and Plavix  for appropriate duration as it is scheduled for 06/19/2024.  Please advise surgeon to reschedule surgery date to allow for medication cessation duration. They will need to send a second request for clearance.

## 2024-06-22 ENCOUNTER — Ambulatory Visit

## 2024-06-24 ENCOUNTER — Ambulatory Visit

## 2024-07-07 ENCOUNTER — Telehealth: Payer: Self-pay | Admitting: *Deleted

## 2024-07-07 NOTE — Telephone Encounter (Signed)
 Received a voicemail from the patient to give him a call.  Called him back and he states that he has not been taking his warfarin since he had complications from kidney stone procedure. He states he was told today not to resume warfarin until they view the CT results from today.   He states that he that they will be calling him about this today or tomorrow and he would call us  back regarding this. He is aware once he resumes we need to know so we can get him appointment.  Ab;e to view Care Everywhere admissions from [facility] for admissions and testing. Last admit was 8/13-8/16 for hematoma, dysuria and CT was done today.

## 2024-07-27 ENCOUNTER — Ambulatory Visit: Attending: Internal Medicine | Admitting: *Deleted

## 2024-07-27 DIAGNOSIS — Z5181 Encounter for therapeutic drug level monitoring: Secondary | ICD-10-CM | POA: Diagnosis not present

## 2024-07-27 DIAGNOSIS — I480 Paroxysmal atrial fibrillation: Secondary | ICD-10-CM | POA: Diagnosis not present

## 2024-07-27 LAB — POCT INR: INR: 1.9 — AB (ref 2.0–3.0)

## 2024-07-27 NOTE — Patient Instructions (Signed)
 Description   INR-1.9; Today take 1.5 tablets of warfarin then continue taking warfarin 1/2 tablet daily except for 1 tablet on Mondays, Wednesdays, and Friday Recheck INR in 3 weeks.  Main # [phone] Coumadin  Clinic# [phone]

## 2024-07-27 NOTE — Progress Notes (Signed)
 Description   INR-1.9; Today take 1.5 tablets of warfarin then continue taking warfarin 1/2 tablet daily except for 1 tablet on Mondays, Wednesdays, and Friday Recheck INR in 3 weeks.  Main # [phone] Coumadin  Clinic# [phone]

## 2024-08-14 ENCOUNTER — Other Ambulatory Visit: Payer: Self-pay | Admitting: Physician Assistant

## 2024-08-14 DIAGNOSIS — E785 Hyperlipidemia, unspecified: Secondary | ICD-10-CM

## 2024-08-14 DIAGNOSIS — I10 Essential (primary) hypertension: Secondary | ICD-10-CM

## 2024-08-17 ENCOUNTER — Ambulatory Visit: Attending: Internal Medicine

## 2024-08-17 DIAGNOSIS — Z5181 Encounter for therapeutic drug level monitoring: Secondary | ICD-10-CM | POA: Diagnosis not present

## 2024-08-17 DIAGNOSIS — I480 Paroxysmal atrial fibrillation: Secondary | ICD-10-CM | POA: Diagnosis not present

## 2024-08-17 LAB — POCT INR: INR: 2.7 (ref 2.0–3.0)

## 2024-08-17 NOTE — Patient Instructions (Signed)
 continue taking warfarin 1/2 tablet daily except for 1 tablet on Mondays, Wednesdays, and Friday Recheck INR in 6 weeks.  Main # [phone] Coumadin  Clinic# [phone]

## 2024-08-17 NOTE — Progress Notes (Signed)
 INR 2.7 Please see anticoagulation encounter  continue taking warfarin 1/2 tablet daily except for 1 tablet on Mondays, Wednesdays, and Friday Recheck INR in 6 weeks.  Main # [phone] Coumadin  Clinic# [phone]

## 2024-09-28 ENCOUNTER — Ambulatory Visit: Attending: Internal Medicine

## 2024-09-28 DIAGNOSIS — I480 Paroxysmal atrial fibrillation: Secondary | ICD-10-CM | POA: Diagnosis not present

## 2024-09-28 DIAGNOSIS — Z5181 Encounter for therapeutic drug level monitoring: Secondary | ICD-10-CM | POA: Diagnosis not present

## 2024-09-28 LAB — POCT INR: INR: 2.4 (ref 2.0–3.0)

## 2024-09-28 NOTE — Patient Instructions (Signed)
 continue taking warfarin 1/2 tablet daily except for 1 tablet on Mondays, Wednesdays, and Friday Recheck INR in 6 weeks.  Main # [phone] Coumadin  Clinic# [phone]

## 2024-09-28 NOTE — Progress Notes (Signed)
 INR 2.4 Please see anticoagulation encounter continue taking warfarin 1/2 tablet daily except for 1 tablet on Mondays, Wednesdays, and Friday Recheck INR in 6 weeks.  Main # [phone] Coumadin  Clinic# [phone]

## 2024-09-29 ENCOUNTER — Telehealth: Payer: Self-pay | Admitting: Interventional Cardiology

## 2024-09-29 DIAGNOSIS — I10 Essential (primary) hypertension: Secondary | ICD-10-CM

## 2024-09-29 DIAGNOSIS — E785 Hyperlipidemia, unspecified: Secondary | ICD-10-CM

## 2024-10-01 MED ORDER — ATORVASTATIN CALCIUM 40 MG PO TABS: 40.0000 mg | ORAL_TABLET | Freq: Every day | ORAL | 1 refills | Status: DC

## 2024-10-01 MED ORDER — LISINOPRIL 10 MG PO TABS: 10.0000 mg | ORAL_TABLET | Freq: Every day | ORAL | 1 refills | Status: DC

## 2024-10-01 MED ORDER — CLOPIDOGREL BISULFATE 75 MG PO TABS
75.0000 mg | ORAL_TABLET | Freq: Every day | ORAL | 1 refills | Status: AC
Start: 2024-10-01 — End: ?

## 2024-10-01 MED ORDER — EZETIMIBE 10 MG PO TABS: 10.0000 mg | ORAL_TABLET | Freq: Every day | ORAL | 1 refills | Status: DC

## 2024-10-01 NOTE — Telephone Encounter (Signed)
 Refills has been sent.  Pt has appt in 11/30/24.  Pt aware.

## 2024-10-01 NOTE — Telephone Encounter (Signed)
 Pt calling to f/u on refills. Pt has appt scheduled 11/30/24. Pt is out of metoprolol . Please advise.

## 2024-10-26 ENCOUNTER — Other Ambulatory Visit: Payer: Self-pay | Admitting: Internal Medicine

## 2024-10-26 DIAGNOSIS — I480 Paroxysmal atrial fibrillation: Secondary | ICD-10-CM

## 2024-11-09 ENCOUNTER — Ambulatory Visit: Attending: Internal Medicine

## 2024-11-09 DIAGNOSIS — Z5181 Encounter for therapeutic drug level monitoring: Secondary | ICD-10-CM

## 2024-11-09 DIAGNOSIS — I480 Paroxysmal atrial fibrillation: Secondary | ICD-10-CM

## 2024-11-09 LAB — POCT INR: INR: 2.3 (ref 2.0–3.0)

## 2024-11-09 NOTE — Progress Notes (Signed)
"   INR 2.3 Please see anticoagulation encounter continue taking warfarin 1/2 tablet daily except for 1 tablet on Mondays, Wednesdays, and Friday Recheck INR in 6 weeks.  Main # [phone] Coumadin  Clinic# [phone] "

## 2024-11-09 NOTE — Patient Instructions (Signed)
 continue taking warfarin 1/2 tablet daily except for 1 tablet on Mondays, Wednesdays, and Friday Recheck INR in 6 weeks.  Main # [phone] Coumadin  Clinic# [phone]

## 2024-11-20 ENCOUNTER — Other Ambulatory Visit: Payer: Self-pay | Admitting: Internal Medicine

## 2024-11-22 ENCOUNTER — Other Ambulatory Visit: Payer: Self-pay | Admitting: Internal Medicine

## 2024-11-22 DIAGNOSIS — E785 Hyperlipidemia, unspecified: Secondary | ICD-10-CM

## 2024-11-22 DIAGNOSIS — I10 Essential (primary) hypertension: Secondary | ICD-10-CM

## 2024-11-23 ENCOUNTER — Other Ambulatory Visit: Payer: Self-pay

## 2024-11-23 ENCOUNTER — Ambulatory Visit: Attending: Internal Medicine | Admitting: Internal Medicine

## 2024-11-23 ENCOUNTER — Other Ambulatory Visit (HOSPITAL_COMMUNITY): Payer: Self-pay

## 2024-11-23 VITALS — BP 109/62 | HR 78 | Ht 68.0 in | Wt 172.0 lb

## 2024-11-23 DIAGNOSIS — I739 Peripheral vascular disease, unspecified: Secondary | ICD-10-CM

## 2024-11-23 DIAGNOSIS — I2510 Atherosclerotic heart disease of native coronary artery without angina pectoris: Secondary | ICD-10-CM | POA: Diagnosis not present

## 2024-11-23 DIAGNOSIS — E785 Hyperlipidemia, unspecified: Secondary | ICD-10-CM | POA: Diagnosis not present

## 2024-11-23 DIAGNOSIS — I10 Essential (primary) hypertension: Secondary | ICD-10-CM | POA: Diagnosis not present

## 2024-11-23 DIAGNOSIS — I480 Paroxysmal atrial fibrillation: Secondary | ICD-10-CM

## 2024-11-23 LAB — LIPID PANEL
Chol/HDL Ratio: 2.4 ratio (ref 0.0–5.0)
Cholesterol, Total: 95 mg/dL — ABNORMAL LOW (ref 100–199)
HDL: 40 mg/dL
LDL Chol Calc (NIH): 26 mg/dL (ref 0–99)
Triglycerides: 178 mg/dL — ABNORMAL HIGH (ref 0–149)
VLDL Cholesterol Cal: 29 mg/dL (ref 5–40)

## 2024-11-23 MED ORDER — NITROGLYCERIN 0.4 MG SL SUBL: 0.4000 mg | SUBLINGUAL_TABLET | SUBLINGUAL | 3 refills | Status: AC | PRN

## 2024-11-23 MED ORDER — METOPROLOL TARTRATE 25 MG PO TABS: 25.0000 mg | ORAL_TABLET | Freq: Two times a day (BID) | ORAL | 3 refills | Status: AC

## 2024-11-23 MED ORDER — LISINOPRIL 10 MG PO TABS: 10.0000 mg | ORAL_TABLET | Freq: Every day | ORAL | 3 refills | Status: AC

## 2024-11-23 MED ORDER — EZETIMIBE 10 MG PO TABS: 10.0000 mg | ORAL_TABLET | Freq: Every day | ORAL | 3 refills | Status: AC

## 2024-11-23 MED ORDER — ATORVASTATIN CALCIUM 40 MG PO TABS: 40.0000 mg | ORAL_TABLET | Freq: Every day | ORAL | 3 refills | Status: AC

## 2024-11-23 MED ORDER — WARFARIN SODIUM 5 MG PO TABS
2.5000 mg | ORAL_TABLET | Freq: Every day | ORAL | 1 refills | Status: AC
  Filled 2024-11-23: qty 75, 90d supply, fill #0

## 2024-11-23 MED ORDER — NICOTINE 21-14-7 MG/24HR TD KIT: PACK | TRANSDERMAL | 0 refills | Status: AC

## 2024-11-23 MED ORDER — OMEGA-3 FISH OIL 1000 MG PO CAPS: 1.0000 | ORAL_CAPSULE | Freq: Two times a day (BID) | ORAL | 3 refills | Status: AC

## 2024-11-23 NOTE — Progress Notes (Signed)
 " Cardiology Office Note:  .    Date:  11/23/2024  ID:  [name], DOB May 19, 1956, MRN [id number] PCP: Patient, No Pcp Per  [facility] Providers Cardiologist:  Stanly DELENA Leavens, MD     CC: CAD eval  History of Present Illness: .    [name] is a 69 y.o. male with atrial fibrillation and coronary artery disease who presents for cardiac evaluation and medication management.  He has a history of paroxysmal atrial fibrillation and was previously on Coumadin . Since starting metoprolol , he has not experienced the episodes he used to have. He recalls a past episode where he thought he was having a heart attack, which led to his initial diagnosis and treatment. He describes the sensation as 'boom, boom, boom, boom, boom, boom' with one beat freezing while the other continues. He has been on metoprolol  for decades and notes that he can usually feel when an episode occurs. He has not had any recent episodes of atrial fibrillation since being on metoprolol .  He has a history of coronary artery disease with a prior PCI to his RCA in 2018. He is currently taking atorvastatin  40 mg, Zetia  10 mg, and metoprolol  25 mg twice a day. He also takes lisinopril  10 mg for hypertension. He carries nitroglycerin  but has not experienced chest pain or palpitations recently.  He has a history of peripheral arterial disease and subclavian steal syndrome, managed by vascular surgery. 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  He is currently taking warfarin and is managed by the Coumadin  Clinic. He is usually 'pretty good' with his INR checks every six weeks. He also takes clopidogrel  but is unsure why he is still on it. He recalls an incident where his INR went up to nine, leading to a hospital visit due to hematuria, which he attributes to taking ibuprofen and acetaminophen  together.  He is a smoker working on cessation. He acknowledges hearing a  scratch in his voice and wants to quit smoking. He feels 'pretty good' overall. No recent chest pain or palpitations. He notes occasional epistaxis, which he attributes to his medication use.  Discussed the use of AI scribe software for clinical note transcription with the patient, who gave verbal consent to proceed.   Relevant histories: .  Social  - former CS patient; he has no memory of [facility]- Michelle Lenze ROS: As per HPI.   Studies Reviewed: .     Cardiac Studies & Procedures   ______________________________________________________________________________________________ CARDIAC CATHETERIZATION  CARDIAC CATHETERIZATION 12/10/2016  Conclusion Conclusions: 1. Significant 2 vessel coronary artery disease, including 95% midvessel stenosis of very large RCA, as well as chronic total occlusion of small apical LAD. 2. Mild diffuse disease involving LAD and LCx. 3. Normal left ventricular filling pressure. 4. Normal left ventricular contraction. 5. Successful IVUS-guided PCI of mid RCA with placement of Xience Alpine 3.5 x 28 mm drug-eluting stent (post-dilated with 4.5 mm non-compliant balloon) with 0% residual stenosis and TIMI-3 flow.  Recommendations: 1. Admit for overnight observation, given significant pain during and immediately after intervention. 2. Continue nitroglycerin  infusion, to be weaned off as chest pain allows. 3. Continue tirofiban  infusion for 6 hours. 4. Continue dual antiplatelet therapy with aspirin  and clopidogrel  for at least 6 months, ideally longer. 5. Aggressive secondary prevention, including escalation of statin therapy to atorvastatin  80 mg daily.  Findings Coronary Findings Diagnostic  Dominance: Right  Left Main Vessel is moderate in size.  Left  Anterior Descending Vessel is moderate in size. There is mild the vessel.  First Diagonal Branch Vessel is small in size.  Second Diagonal Branch Vessel is moderate in size.  Left  Circumflex Vessel is small. There is mild the vessel.  First Obtuse Marginal Branch Vessel is small in size.  Right Coronary Artery Vessel is large. The lesion is type C and irregular.  Right Posterior Descending Artery Vessel is moderate in size. Collaterals RPDA filled by collaterals from Dist LAD.  Right Posterior Atrioventricular Artery Vessel is large in size.  Intervention  Prox RCA to Mid RCA lesion Angioplasty Lesion length: 24 mm. A STENT XIENCE ALPINE RX [id number] drug eluting stent was successfully placed. The pre-interventional distal flow is normal (TIMI 3).  The post-interventional distal flow is normal (TIMI 3). See technique for details of intervention. There is a 0% residual stenosis post intervention.     ECHOCARDIOGRAM  ECHOCARDIOGRAM COMPLETE 11/10/2015  Narrative *[facility]* [address] [place], [place] [address] [phone]  ------------------------------------------------------------------- Transthoracic Echocardiography  Patient:    [name], [name] MR #:       [id number] Study Date: 11/10/2015 Gender:     M Age:        59 Height:     172.7 cm Weight:     83 kg BSA:        2.01 m^2 Pt. Status: Room:  PERFORMING   [facility], [address]    [place], Hao [id number] NMIZMPWH     Fzwh, Hao [id number] BARTON Janene Boer [id number] SONOGRAPHER  Arley Pac, RVT, RDCS, RDMS  cc:  ------------------------------------------------------------------- LV EF: 55% -   60%  ------------------------------------------------------------------- History:   PMH:  Recent episode of acute hypertension and tachycardia. Patient had not been taking prescribed medication. Palpitations.  Risk factors:  Current tobacco use. Hypertension. Dyslipidemia.  ------------------------------------------------------------------- Study Conclusions  - Left ventricle: The cavity size was normal. Systolic function was normal. The estimated ejection  fraction was in the range of 55% to 60%. Wall motion was normal; there were no regional wall motion abnormalities. Left ventricular diastolic function parameters were normal. - Mitral valve: There was mild regurgitation. - Left atrium: The atrium was normal in size. - Right ventricle: Systolic function was normal. - Pulmonary arteries: Systolic pressure was within the normal range.  Impressions:  - Normal study.  Transthoracic echocardiography.  M-mode, complete 2D, spectral Doppler, and color Doppler.  Birthdate:  Patient birthdate: 1956/02/14.  Age:  Patient is 69 yr old.  Sex:  Gender: male. BMI: 27.8 kg/m^2.  Blood pressure:     130/80  Patient status: Outpatient.  Study date:  Study date: 11/10/2015. Study time: 02:11 PM.  -------------------------------------------------------------------  ------------------------------------------------------------------- Left ventricle:  The cavity size was normal. Systolic function was normal. The estimated ejection fraction was in the range of 55% to 60%. Wall motion was normal; there were no regional wall motion abnormalities. The transmitral flow pattern was normal. The deceleration time of the early transmitral flow velocity was normal. The pulmonary vein flow pattern was normal. The tissue Doppler parameters were normal. Left ventricular diastolic function parameters were normal.  ------------------------------------------------------------------- Aortic valve:   Trileaflet; normal thickness leaflets. Mobility was not restricted.  Doppler:  Transvalvular velocity was within the normal range. There was no stenosis. There was no regurgitation. VTI ratio of LVOT to aortic valve: 0.56. Valve area (VTI): 1.94 cm^2. Indexed valve area (VTI): 0.96 cm^2/m^2. Peak velocity ratio of LVOT to aortic valve: 0.66. Valve area (  Vmax): 2.3 cm^2. Indexed valve area (Vmax): 1.14 cm^2/m^2. Mean velocity ratio of LVOT to aortic valve: 0.57. Valve  area (Vmean): 1.98 cm^2. Indexed valve area (Vmean): 0.98 cm^2/m^2.    Mean gradient (S): 6 mm Hg. Peak gradient (S): 11 mm Hg.  ------------------------------------------------------------------- Aorta:  Aortic root: The aortic root was normal in size.  ------------------------------------------------------------------- Mitral valve:   Structurally normal valve.   Mobility was not restricted.  Doppler:  Transvalvular velocity was within the normal range. There was no evidence for stenosis. There was mild regurgitation.    Peak gradient (D): 3 mm Hg.  ------------------------------------------------------------------- Left atrium:  The atrium was normal in size.  ------------------------------------------------------------------- Right ventricle:  The cavity size was normal. Wall thickness was normal. Systolic function was normal.  ------------------------------------------------------------------- Pulmonic valve:    Structurally normal valve.   Cusp separation was normal.  Doppler:  Transvalvular velocity was within the normal range. There was no evidence for stenosis. There was trivial regurgitation.  ------------------------------------------------------------------- Tricuspid valve:   Structurally normal valve.    Doppler: Transvalvular velocity was within the normal range. There was mild regurgitation.  ------------------------------------------------------------------- Pulmonary artery:   The main pulmonary artery was normal-sized. Systolic pressure was within the normal range.  ------------------------------------------------------------------- Right atrium:  The atrium was normal in size.  ------------------------------------------------------------------- Pericardium:  There was no pericardial effusion.  ------------------------------------------------------------------- Systemic veins: Inferior vena cava: The vessel was normal in  size.  ------------------------------------------------------------------- Measurements  Left ventricle                            Value          Reference LV ID, ED, PLAX chordal                   48.2  mm       43 - 52 LV ID, ES, PLAX chordal                   33.3  mm       23 - 38 LV fx shortening, PLAX chordal            31    %        >=29 LV PW thickness, ED                       9.95  mm       --------- IVS/LV PW ratio, ED                       0.94           <=1.3 Stroke volume, 2D                         66    ml       --------- Stroke volume/bsa, 2D                     33    ml/m^2   --------- LV e&', lateral                            9.78  cm/s     --------- LV E/e&', lateral                          8.58           ---------  LV e&', medial                             9.27  cm/s     --------- LV E/e&', medial                           9.05           --------- LV e&', average                            9.53  cm/s     --------- LV E/e&', average                          8.81           ---------  Ventricular septum                        Value          Reference IVS thickness, ED                         9.34  mm       ---------  LVOT                                      Value          Reference LVOT ID, S                                21    mm       --------- LVOT area                                 3.46  cm^2     --------- LVOT ID                                   21    mm       --------- LVOT peak velocity, S                     111   cm/s     --------- LVOT mean velocity, S                     61.9  cm/s     --------- LVOT VTI, S                               19.1  cm       --------- LVOT peak gradient, S                     5     mm Hg    --------- Stroke volume (SV), LVOT DP               66.2  ml       --------- Stroke index (SV/bsa), LVOT DP  32.9  ml/m^2   ---------  Aortic valve                              Value          Reference Aortic valve peak  velocity, S             167   cm/s     --------- Aortic valve mean velocity, S             108   cm/s     --------- Aortic valve VTI, S                       34.1  cm       --------- Aortic mean gradient, S                   6     mm Hg    --------- Aortic peak gradient, S                   11    mm Hg    --------- VTI ratio, LVOT/AV                        0.56           --------- Aortic valve area, VTI                    1.94  cm^2     --------- Aortic valve area/bsa, VTI                0.96  cm^2/m^2 --------- Velocity ratio, peak, LVOT/AV             0.66           --------- Aortic valve area, peak velocity          2.3   cm^2     --------- Aortic valve area/bsa, peak               1.14  cm^2/m^2 --------- velocity Velocity ratio, mean, LVOT/AV             0.57           --------- Aortic valve area, mean velocity          1.98  cm^2     --------- Aortic valve area/bsa, mean               0.98  cm^2/m^2 --------- velocity  Aorta                                     Value          Reference Aortic root ID, ED                        34    mm       --------- Ascending aorta ID, A-P, S                30    mm       ---------  Left atrium                               Value  Reference LA ID, A-P, ES                            34    mm       --------- LA ID/bsa, A-P                            1.69  cm/m^2   <=2.2 LA volume, S                              59    ml       --------- LA volume/bsa, S                          29.3  ml/m^2   --------- LA volume, ES, 1-p A4C                    51    ml       --------- LA volume/bsa, ES, 1-p A4C                25.3  ml/m^2   --------- LA volume, ES, 1-p A2C                    64    ml       --------- LA volume/bsa, ES, 1-p A2C                31.8  ml/m^2   ---------  Mitral valve                              Value          Reference Mitral E-wave peak velocity               83.9  cm/s     --------- Mitral A-wave peak velocity                66.1  cm/s     --------- Mitral deceleration time          (H)     264   ms       150 - 230 Mitral peak gradient, D                   3     mm Hg    --------- Mitral E/A ratio, peak                    1.3            ---------  Tricuspid valve                           Value          Reference Tricuspid maximal inflow                  238   cm/s     --------- velocity, PISA  Pulmonic valve                            Value          Reference Pulmonic regurg velocity, ED  96.4  cm/s     ---------  Legend: (L)  and  (H)  mark values outside specified reference range.  ------------------------------------------------------------------- Prepared and Electronically Authenticated by  Velinda Lunger, MD, [facility] 2016-12-30T14:13:27          ______________________________________________________________________________________________       Physical Exam:    VS:  BP 109/62 (BP Location: Left Arm)   Pulse 78   Ht 5' 8 (1.727 m)   Wt 172 lb (78 kg)   SpO2 97%   BMI 26.15 kg/m    Wt Readings from Last 3 Encounters:  11/23/24 172 lb (78 kg)  05/12/24 165 lb (74.8 kg)  09/30/23 176 lb (79.8 kg)    Gen: no distress Cardiac: No Rubs or Gallops, no Murmur, RRR +2 radial pulses Respiratory: Clear to auscultation bilaterally, normal effort, normal  respiratory rate GI: Soft, nontender, non-distended  MS: No  edema;  moves all extremities Integument: Skin feels warm Neuro:  At time of evaluation, alert and oriented to person/place/time/situation  Psych: Normal affect, patient feels ok     ASSESSMENT AND PLAN: .    Coronary artery disease, status post PCI to RCA Coronary artery disease with prior PCI to RCA in 2018. Currently in sinus rhythm. No recent interventions. Cholesterol levels slightly elevated. No current chest pain or palpitations. Discussed aggressive cholesterol management to prevent future cardiac events. Emphasized the importance of maintaining low cholesterol  levels to prevent further stenting.  I have stopped plavix  - Refilled atorvastatin  40 mg - Refilled Zetia  10 mg - Refilled metoprolol  25 mg twice a day - Refilled nitroglycerin  - Ordered fasting cholesterol test  Paroxysmal atrial fibrillation Currently in sinus rhythm with no recent episodes. Previously on metoprolol , which has been effective in preventing episodes. Discussed potential for more aggressive management if episodes recur. Emphasized the importance of medication adherence to prevent recurrence. - Continue metoprolol  25 mg twice a day - Monitor for recurrence of atrial fibrillation  Peripheral arterial disease with claudication Peripheral arterial disease managed by Dr. Serene. No recent interventions. Due for follow-up for ABIs and carotid assessment. Smoking cessation is part of secondary prevention. Discussed the importance of follow-up with Dr. Serene for comprehensive vascular assessment. - Follow up with Dr. Serene for ABIs and carotid assessment as planned  Subclavian steal syndrome - Managed by vascular surgery. No recent interventions or symptoms  Essential hypertension Hypertension well controlled with current medication regimen. - Continue lisinopril  10 mg  Hyperlipidemia Cholesterol levels slightly elevated. Goal is to achieve aggressive cholesterol management to prevent future cardiac events. Discussed the importance of maintaining low cholesterol levels to prevent further stenting. - Ordered fasting cholesterol test - Continue atorvastatin  40 mg - Continue Zetia  10 mg  Nicotine  dependence, cigarettes Actively working on smoking cessation. Discussed the importance of smoking cessation for secondary prevention of vascular disease. Offered nicotine  patches as a cessation aid. Encouraged to set a quit date and use nicotine  patches to aid cessation. - Prescribed nicotine  patches - Encouraged smoking cessation  One year with Rosaline Pavy  Two years with  me  Stanly Leavens, MD FASE [facility] Cardiologist [facility]  [address] [place], [address] [place], [place] [address] [phone]  1:03 PM  "

## 2024-11-23 NOTE — Patient Instructions (Signed)
 Medication Instructions:  Your physician recommends that you continue on your current medications as directed. Please refer to the Current Medication list given to you today.   *If you need a refill on your cardiac medications before your next appointment, please call your pharmacy*  Lab Work: Lipid Panel at [facility]  If you have labs (blood work) drawn today and your tests are completely normal, you will receive your results only by: MyChart Message (if you have MyChart) OR A paper copy in the mail If you have any lab test that is abnormal or we need to change your treatment, we will call you to review the results.  Testing/Procedures: NONE   Follow-Up: At [facility], you and your health needs are our priority.  As part of our continuing mission to provide you with exceptional heart care, our providers are all part of one team.  This team includes your primary Cardiologist (physician) and Advanced Practice Providers or APPs (Physician Assistants and Nurse Practitioners) who all work together to provide you with the care you need, when you need it.  Your next appointment:   1 year(s)  Provider:   Stanly DELENA Leavens, MD or Olivia Pavy, PA-C         Other Instructions

## 2024-11-23 NOTE — Telephone Encounter (Signed)
 Pt came into office requesting Warfarin refill be sent to [facility] pharmacy.  Refilled

## 2024-11-30 ENCOUNTER — Ambulatory Visit: Admitting: Internal Medicine

## 2024-11-30 ENCOUNTER — Ambulatory Visit: Payer: Self-pay

## 2024-11-30 DIAGNOSIS — E785 Hyperlipidemia, unspecified: Secondary | ICD-10-CM

## 2024-11-30 DIAGNOSIS — I2510 Atherosclerotic heart disease of native coronary artery without angina pectoris: Secondary | ICD-10-CM

## 2024-12-21 ENCOUNTER — Ambulatory Visit

## 2025-02-15 ENCOUNTER — Ambulatory Visit

## 2025-02-15 ENCOUNTER — Ambulatory Visit (HOSPITAL_COMMUNITY)
